# Patient Record
Sex: Female | Born: 1985
Health system: Southern US, Community
[De-identification: ages and names within clinical notes are randomized; demographics above are authoritative.]

## PROBLEM LIST (undated history)

## (undated) DIAGNOSIS — D649 Anemia, unspecified: Secondary | ICD-10-CM

## (undated) HISTORY — PX: OTHER SURGICAL HISTORY: SHX169

---

## 2014-08-04 ENCOUNTER — Encounter: Payer: Self-pay | Admitting: Family Medicine

## 2014-08-04 ENCOUNTER — Ambulatory Visit (INDEPENDENT_AMBULATORY_CARE_PROVIDER_SITE_OTHER): Payer: Medicaid Other | Admitting: Family Medicine

## 2014-08-04 ENCOUNTER — Other Ambulatory Visit (HOSPITAL_COMMUNITY)
Admission: RE | Admit: 2014-08-04 | Discharge: 2014-08-04 | Disposition: A | Discharge status: Home / Self Care | Payer: Medicaid Other | Source: Ambulatory Visit | Attending: Family Medicine | Admitting: Family Medicine

## 2014-08-04 VITALS — BP 112/72 | HR 108 | Temp 98.4°F | Ht 64.5 in | Wt 156.6 lb

## 2014-08-04 DIAGNOSIS — Z789 Other specified health status: Secondary | ICD-10-CM

## 2014-08-04 DIAGNOSIS — Z113 Encounter for screening for infections with a predominantly sexual mode of transmission: Secondary | ICD-10-CM | POA: Insufficient documentation

## 2014-08-04 DIAGNOSIS — Z7189 Other specified counseling: Secondary | ICD-10-CM

## 2014-08-04 DIAGNOSIS — R81 Glycosuria: Secondary | ICD-10-CM

## 2014-08-04 DIAGNOSIS — Z3482 Encounter for supervision of other normal pregnancy, second trimester: Secondary | ICD-10-CM

## 2014-08-04 DIAGNOSIS — Z603 Acculturation difficulty: Secondary | ICD-10-CM | POA: Insufficient documentation

## 2014-08-04 DIAGNOSIS — N912 Amenorrhea, unspecified: Secondary | ICD-10-CM

## 2014-08-04 DIAGNOSIS — Z7689 Persons encountering health services in other specified circumstances: Secondary | ICD-10-CM

## 2014-08-04 DIAGNOSIS — Z3480 Encounter for supervision of other normal pregnancy, unspecified trimester: Secondary | ICD-10-CM | POA: Insufficient documentation

## 2014-08-04 LAB — POCT URINALYSIS DIPSTICK
Bilirubin, UA: NEGATIVE
Blood, UA: NEGATIVE
Glucose, UA: 100
Leukocytes, UA: NEGATIVE
Nitrite, UA: NEGATIVE
Protein, UA: NEGATIVE
Spec Grav, UA: 1.02
Urobilinogen, UA: 0.2
pH, UA: 7

## 2014-08-04 LAB — POCT URINE PREGNANCY: Preg Test, Ur: POSITIVE

## 2014-08-04 LAB — GLUCOSE, CAPILLARY: Glucose-Capillary: 107 mg/dL — ABNORMAL HIGH (ref 70–99)

## 2014-08-04 NOTE — Assessment & Plan Note (Addendum)
Would be a scheduled C-section (2 prior c- sections) Adopt a mom application needed (Dr. Claiborne BillingsKuneff to be OB) >>[facility] Positive ketones, glucosuria, POCT 107 in office (otherwise negative UA) Exam consistent with ~ [redacted] week gestation.  FHR 150's, good fetal movement, no leakage of fluids or vaginal bleeding.  F/U 1 week for labs.

## 2014-08-04 NOTE — Progress Notes (Signed)
Clemens CatholicSarra Gabralla  interpreter utilized during today's visit.  [facility] New Patient Visit  HPI:  [facility] for a new patient appointment to establish general primary care, also to discuss her pregnancy. Last menstrual period: March 31, 2014. Positive pregnancy test August 10. Initial nausea and vomit but as passed. [place] home country. Immig[place] to [place] - "sugar in urine" a few weeks ago.  - Went through [place]  for 1 month for immigration.   ROS: See HPI  Immigrant Social History: - Name spelling correctly?:Yes - Date arrived in [place]: Oct.16, 2015 - Language: Arabic, Sudense  -Requires intepreter (essentially speaks no [place]) - Education: Highest level of education: 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, Environmental managerphysics) - Prior work:None - Other important info: Both children born via c-section (failure to progress)  - Best contact name and number: [phone] - Tobacco/alcohol/drug use: No tobacco/alcohol/drugs - Marriage Status: Married (7 years), 2 children - Sexual activity: Yes - Class A/B conditions: pregnancy - Documented IOM Health Problems: None - Were you beaten or tortured in your country or refugee camp? No, immigrated to [place]    Preventative Care History: -Seen at health department?: No   Past Medical Hx:  - [id number]- G4P2012; one miscarriage   Past Surgical Hx:  - C-sections x2 2009 for failure to progress and 2011 scheduled.  Family Hx: updated in Epic - Number of family members: One sister, four brothers, parents living  - Number of family members in [place]: Husband, Uncle  PHYSICAL EXAM: BP 112/72 mmHg  Pulse 108  Temp(Src) 98.4 F (36.9 C) (Oral)  Ht 5' 4.5" (1.638 m)  Wt 156 lb 9.6 oz (71.033 kg)  BMI 26.47 kg/m2  LMP 03/11/2014 (Exact Date) MVH:QIONGEXBGen:pleasant HEENT: AT. [place]. Bilateral TM visualized and normal in appearance. Bilateral eyes without injections or icterus. MMM. Bilateral nares without erythema or swelling. Throat without erythema or exudates.   CV: Mildly tachycardic, no murmur.  Chest: CTAB, no wheeze or crackles Abd: Soft. Gravid (Uterine fundus 1 finger breath below umbilicus) . NTND. BS present  Ext: No erythema. Trace edema.  Skin:No rashes, purpura or petechiae.  Neuro: Normal gait. PERLA. EOMi. Alert. CN 2- 12 intact   Psych: normal affect, dress, mood. Normal speech.     # See also problem based charting.   Examined and interviewed with Dr. Gwendolyn GrantWalden  FOLLOW UP: F/u in 1 week for labs and follow up to ketones/glucose in urine with normal POCT glucose of 107

## 2014-08-04 NOTE — Assessment & Plan Note (Signed)
Immigration from [place].

## 2014-08-04 NOTE — Patient Instructions (Addendum)
We need you to get your insurance started. We will be contacting you for information on getting this started.  I will need to see back in one week and have labs drawn (Tuesday 10th @ 11:30)

## 2014-08-06 DIAGNOSIS — R81 Glycosuria: Secondary | ICD-10-CM | POA: Insufficient documentation

## 2014-08-06 LAB — URINE CYTOLOGY ANCILLARY ONLY
Chlamydia: NEGATIVE
Neisseria Gonorrhea: NEGATIVE

## 2014-08-06 NOTE — Assessment & Plan Note (Signed)
Needs BMET next visit to screen for kidney disease.

## 2014-08-12 ENCOUNTER — Encounter: Payer: Self-pay | Admitting: Family Medicine

## 2014-08-12 ENCOUNTER — Ambulatory Visit (INDEPENDENT_AMBULATORY_CARE_PROVIDER_SITE_OTHER): Payer: Medicaid Other | Admitting: Family Medicine

## 2014-08-12 VITALS — BP 100/69 | HR 97 | Temp 98.4°F | Ht 65.0 in | Wt 157.0 lb

## 2014-08-12 DIAGNOSIS — R81 Glycosuria: Secondary | ICD-10-CM

## 2014-08-12 DIAGNOSIS — O000 Abdominal pregnancy: Secondary | ICD-10-CM

## 2014-08-12 DIAGNOSIS — O0001 Abdominal pregnancy with intrauterine pregnancy: Secondary | ICD-10-CM

## 2014-08-12 DIAGNOSIS — Z3481 Encounter for supervision of other normal pregnancy, first trimester: Secondary | ICD-10-CM

## 2014-08-12 NOTE — Patient Instructions (Signed)
We will check labs today.  Come back in about 4 weeks for your pregnancy follow-up.   We should be able to get pregnancy Medicaid for you.  Call if you are unable to obtain this.    It was good to see you today!

## 2014-08-12 NOTE — Progress Notes (Addendum)
Subjective:    Arabic Interpreter:  [name] from [employer]  [name] is a 28 y.o. [phone] [facility]U for glycosuria:  1.  Glycosuria:  Found on U/A last visit to clinic.  UA at that time otherwise negative.  Told she had "sugar urine" while in [place] as well.  Returned today for [employer] check.    Also pregnant.  No headaches.  No vision changes.  No LE edema.  No vaginal bleeding or discharge.  Currently establishing pregnancy Medicaid.   ROS as above per HPI, otherwise neg.   The following portions of the patient's history were reviewed and updated as appropriate: allergies, current medications, past medical history, family and social history, and problem list. Patient is a nonsmoker.    PMH reviewed.  No past medical history on file. No past surgical history on file.  Medications reviewed. No current outpatient prescriptions on file.   No current facility-administered medications for this visit.     Objective:   Physical Exam BP 100/69 mmHg  Pulse 97  Temp(Src) 98.4 F (36.9 C) (Oral)  Ht 5\' 5"  (1.651 m)  Wt 157 lb (71.215 kg)  BMI 26.13 kg/m2  LMP 03/31/2014 Gen:  Alert, cooperative patient who appears stated age in no acute distress.  Vital signs reviewed. HEENT: EOMI,  MMM Cardiac:  Regular rate and rhythm without murmur auscultated.  Good S1/S2. Pulm:  Clear to auscultation bilaterally with good air movement.  No wheezes or rales noted.   Back:  No CVA tenderness.   No results found for this or any previous visit (from the past 72 hour(s)).

## 2014-08-13 ENCOUNTER — Encounter: Payer: Self-pay | Admitting: Family Medicine

## 2014-08-13 LAB — OBSTETRIC PANEL
Antibody Screen: NEGATIVE
Basophils Absolute: 0 10*3/uL (ref 0.0–0.1)
Basophils Relative: 0 % (ref 0–1)
Eosinophils Absolute: 0.1 10*3/uL (ref 0.0–0.7)
Eosinophils Relative: 1 % (ref 0–5)
HCT: 32.8 % — ABNORMAL LOW (ref 36.0–46.0)
Hemoglobin: 10.2 g/dL — ABNORMAL LOW (ref 12.0–15.0)
Hepatitis B Surface Ag: NEGATIVE
Lymphocytes Relative: 26 % (ref 12–46)
Lymphs Abs: 2.1 10*3/uL (ref 0.7–4.0)
MCH: 20.1 pg — ABNORMAL LOW (ref 26.0–34.0)
MCHC: 31.1 g/dL (ref 30.0–36.0)
MCV: 64.7 fL — ABNORMAL LOW (ref 78.0–100.0)
Monocytes Absolute: 0.6 10*3/uL (ref 0.1–1.0)
Monocytes Relative: 7 % (ref 3–12)
Neutro Abs: 5.3 10*3/uL (ref 1.7–7.7)
Neutrophils Relative %: 66 % (ref 43–77)
Platelets: 416 10*3/uL — ABNORMAL HIGH (ref 150–400)
RBC: 5.07 MIL/uL (ref 3.87–5.11)
RDW: 22.1 % — ABNORMAL HIGH (ref 11.5–15.5)
Rh Type: POSITIVE
Rubella: <0.1 Index (ref <0.90)
WBC: 8 10*3/uL (ref 4.0–10.5)

## 2014-08-13 LAB — SICKLE CELL SCREEN: Sickle Cell Screen: NEGATIVE

## 2014-08-13 LAB — HIV ANTIBODY (ROUTINE TESTING W REFLEX): HIV 1&2 Ab, 4th Generation: NONREACTIVE

## 2014-08-13 NOTE — Assessment & Plan Note (Addendum)
[id number].   Healthy children aged 89 and 89, both born [place]. Doing well from pregnancy standpoint.  Prenatal vitamins and checking prenatal labs today.

## 2014-08-13 NOTE — Assessment & Plan Note (Signed)
BMET today to evaluate for kidney dysfunction.  Also possibly secondary to pregnancy.   Will FU at 1st OB visit pending her lab results.

## 2014-08-14 LAB — URINE CULTURE
Colony Count: NO GROWTH
Organism ID, Bacteria: NO GROWTH

## 2014-09-03 ENCOUNTER — Ambulatory Visit (INDEPENDENT_AMBULATORY_CARE_PROVIDER_SITE_OTHER): Payer: Medicaid Other | Admitting: Family Medicine

## 2014-09-03 ENCOUNTER — Ambulatory Visit (INDEPENDENT_AMBULATORY_CARE_PROVIDER_SITE_OTHER): Payer: Medicaid Other

## 2014-09-03 ENCOUNTER — Encounter: Payer: Self-pay | Admitting: Family Medicine

## 2014-09-03 VITALS — BP 115/78 | HR 102 | Wt 162.7 lb

## 2014-09-03 DIAGNOSIS — O99012 Anemia complicating pregnancy, second trimester: Secondary | ICD-10-CM

## 2014-09-03 DIAGNOSIS — Z862 Personal history of diseases of the blood and blood-forming organs and certain disorders involving the immune mechanism: Secondary | ICD-10-CM | POA: Insufficient documentation

## 2014-09-03 DIAGNOSIS — R81 Glycosuria: Secondary | ICD-10-CM

## 2014-09-03 DIAGNOSIS — Z3482 Encounter for supervision of other normal pregnancy, second trimester: Secondary | ICD-10-CM

## 2014-09-03 DIAGNOSIS — O99019 Anemia complicating pregnancy, unspecified trimester: Secondary | ICD-10-CM | POA: Insufficient documentation

## 2014-09-03 DIAGNOSIS — Z23 Encounter for immunization: Secondary | ICD-10-CM

## 2014-09-03 LAB — BASIC METABOLIC PANEL
BUN: 6 mg/dL (ref 6–23)
CO2: 21 mEq/L (ref 19–32)
Calcium: 9.1 mg/dL (ref 8.4–10.5)
Chloride: 105 mEq/L (ref 96–112)
Creat: 0.44 mg/dL — ABNORMAL LOW (ref 0.50–1.10)
Glucose, Bld: 77 mg/dL (ref 70–99)
Potassium: 4.1 mEq/L (ref 3.5–5.3)
Sodium: 135 mEq/L (ref 135–145)

## 2014-09-03 LAB — TSH: TSH: 2.235 u[IU]/mL (ref 0.350–4.500)

## 2014-09-03 MED ORDER — PRENATAL/IRON PO TABS: 1.0000 | ORAL_TABLET | Freq: Every day | ORAL | Status: DC

## 2014-09-03 NOTE — Progress Notes (Signed)
[name] is a 28 y.o. [phone] @ 4175w3d presents for initial prenatal care. Patient did have ultrasound, and [place], that showed her due date to be 12/28/2014. This ultrasound completed [redacted] weeks gestation. She is present today with her husband, [name]. Patient does admit to feeling some fatigue, otherwise she is doing well with her pregnancy. It sounds as if there was a possibility she had been diagnosed with gestational diabetes at the end of her second pregnancy. She was a induction of labor at 39 weeks for her first pregnancy, which proceeded to C-section for failure to progress. Her second pregnancy was a scheduled C-section. She been denies any pressure pain, leaking or gush of fluid or vaginal bleeding today. No contractions. She does notice some mild changes in her nipples, a small amount of cheesy-like discharge. She denies erythema or pain.  O: BP 115/78 mmHg  Pulse 102  Wt 162 lb 11.2 oz (73.8 kg)  LMP 03/31/2014 (LMP Unknown) Abd: Gravid Breast: Very mild cheesy white discharge can be expressed from nipple. No erythema, no tenderness.   A/P: - make an appt for early glucola asap - tdap at 28 weeks. - A pos - BF, circ if boy; [facility] - Rubella titer Low, will need vaccination after delivery.  - PNV with iron prescribed - US ordered - PTL, vaginal bleeding precautions discussed, pt is aware to go to [facility] and its location - We'll need to establish with women's clinic, for repeat C-section scheduled. - TSH and BMP today - Four-week appointment in OB clinic, will need female provider

## 2014-09-03 NOTE — Assessment & Plan Note (Signed)
BMT ordered today to evaluate kidney dysfunction. Last check patient had 100 glucose in her urine, this could be from pregnancy. She was diagnosed with gestational diabetes at the end of her last pregnancy.

## 2014-09-03 NOTE — Assessment & Plan Note (Signed)
Hemoglobin 10.2. Will prescribe any other vitamins with iron.

## 2014-09-03 NOTE — Patient Instructions (Signed)
Please schedule your one-hour Glucola test for as soon as possible. This test will take about 1 hour. He will have you drink a sugary substance, and the take your blood work one hour later. Your next OB appointment, should be appendectomy OB clinic in 3-4 weeks. He will then start to follow-up with Dr. Claiborne BillingsKuneff approximately every 2 weeks. You received her flu vaccination today.  Second Trimester of Pregnancy The second trimester is from week 13 through week 28, month 4 through 6. This is often the time in pregnancy that you feel your best. Often times, morning sickness has lessened or quit. You may have more energy, and you may get hungry more often. Your unborn baby (fetus) is growing rapidly. At the end of the sixth month, he or she is about 9 inches long and weighs about 1 pounds. You will likely feel the baby move (quickening) between 18 and 20 weeks of pregnancy. HOME CARE   Avoid all smoking, herbs, and alcohol. Avoid drugs not approved by your doctor.  Only take medicine as told by your doctor. Some medicines are safe and some are not during pregnancy.  Exercise only as told by your doctor. Stop exercising if you start having cramps.  Eat regular, healthy meals.  Wear a good support bra if your breasts are tender.  Do not use hot tubs, steam rooms, or saunas.  Wear your seat belt when driving.  Avoid raw meat, uncooked cheese, and liter boxes and soil used by cats.  Take your prenatal vitamins.  Try taking medicine that helps you poop (stool softener) as needed, and if your doctor approves. Eat more fiber by eating fresh fruit, vegetables, and whole grains. Drink enough fluids to keep your pee (urine) clear or pale yellow.  Take warm water baths (sitz baths) to soothe pain or discomfort caused by hemorrhoids. Use hemorrhoid cream if your doctor approves.  If you have puffy, bulging veins (varicose veins), wear support hose. Raise (elevate) your feet for 15 minutes, 3-4 times a  day. Limit salt in your diet.  Avoid heavy lifting, wear low heals, and sit up straight.  Rest with your legs raised if you have leg cramps or low back pain.  Visit your dentist if you have not gone during your pregnancy. Use a soft toothbrush to brush your teeth. Be gentle when you floss.  You can have sex (intercourse) unless your doctor tells you not to.  Go to your doctor visits. GET HELP IF:   You feel dizzy.  You have mild cramps or pressure in your lower belly (abdomen).  You have a nagging pain in your belly area.  You continue to feel sick to your stomach (nauseous), throw up (vomit), or have watery poop (diarrhea).  You have bad smelling fluid coming from your vagina.  You have pain with peeing (urination). GET HELP RIGHT AWAY IF:   You have a fever.  You are leaking fluid from your vagina.  You have spotting or bleeding from your vagina.  You have severe belly cramping or pain.  You lose or gain weight rapidly.  You have trouble catching your breath and have chest pain.  You notice sudden or extreme puffiness (swelling) of your face, hands, ankles, feet, or legs.  You have not felt the baby move in over an hour.  You have severe headaches that do not go away with medicine.  You have vision changes. Document Released: 12/14/2009 Document Revised: 01/14/2013 Document Reviewed: 11/20/2012 ExitCare Patient Information 2015  [employer]. This information is not intended to replace advice given to you by your health care provider. Make sure you discuss any questions you have with your health care provider.

## 2014-09-03 NOTE — Progress Notes (Signed)
Patient was accompanied by interpreter from [employer] because of (Arabic) language barrier.Glennie HawkSimpson, Cheri Ayotte R

## 2014-09-04 ENCOUNTER — Encounter: Payer: Self-pay | Admitting: Family Medicine

## 2014-09-08 ENCOUNTER — Other Ambulatory Visit: Payer: Medicaid Other

## 2014-09-09 ENCOUNTER — Ambulatory Visit (HOSPITAL_COMMUNITY): Payer: Medicaid Other

## 2014-09-09 ENCOUNTER — Other Ambulatory Visit (INDEPENDENT_AMBULATORY_CARE_PROVIDER_SITE_OTHER): Payer: Medicaid Other

## 2014-09-09 DIAGNOSIS — Z3482 Encounter for supervision of other normal pregnancy, second trimester: Secondary | ICD-10-CM

## 2014-09-09 LAB — GLUCOSE, CAPILLARY
Comment 1: 1
Glucose-Capillary: 146 mg/dL — ABNORMAL HIGH (ref 70–99)

## 2014-09-09 NOTE — Progress Notes (Signed)
1 hr ob = 146. 3 hr GTT scheduled for 09/10/14.Grey Rakestraw, Rodena Medinobert Lee

## 2014-09-10 ENCOUNTER — Other Ambulatory Visit (INDEPENDENT_AMBULATORY_CARE_PROVIDER_SITE_OTHER): Payer: Medicaid Other

## 2014-09-10 DIAGNOSIS — Z3482 Encounter for supervision of other normal pregnancy, second trimester: Secondary | ICD-10-CM

## 2014-09-10 LAB — GLUCOSE, CAPILLARY: Glucose-Capillary: 88 mg/dL (ref 70–99)

## 2014-09-11 LAB — GLUCOSE TOLERANCE, 3 HOURS
Glucose Tolerance, 1 hour: 144 mg/dL (ref 70–189)
Glucose Tolerance, 2 hour: 125 mg/dL (ref 70–164)
Glucose Tolerance, Fasting: 80 mg/dL (ref 70–104)
Glucose, GTT - 3 Hour: 133 mg/dL (ref 70–144)

## 2014-09-15 ENCOUNTER — Ambulatory Visit (HOSPITAL_COMMUNITY)
Admission: RE | Admit: 2014-09-15 | Discharge: 2014-09-15 | Disposition: A | Discharge status: Home / Self Care | Payer: Medicaid Other | Source: Ambulatory Visit | Attending: Family Medicine | Admitting: Family Medicine

## 2014-09-15 DIAGNOSIS — Z1389 Encounter for screening for other disorder: Secondary | ICD-10-CM | POA: Insufficient documentation

## 2014-09-15 DIAGNOSIS — O0932 Supervision of pregnancy with insufficient antenatal care, second trimester: Secondary | ICD-10-CM | POA: Diagnosis not present

## 2014-09-15 DIAGNOSIS — Z3A25 25 weeks gestation of pregnancy: Secondary | ICD-10-CM | POA: Diagnosis not present

## 2014-09-15 DIAGNOSIS — Z36 Encounter for antenatal screening of mother: Secondary | ICD-10-CM | POA: Insufficient documentation

## 2014-09-15 DIAGNOSIS — O09292 Supervision of pregnancy with other poor reproductive or obstetric history, second trimester: Secondary | ICD-10-CM | POA: Insufficient documentation

## 2014-09-15 DIAGNOSIS — Z3482 Encounter for supervision of other normal pregnancy, second trimester: Secondary | ICD-10-CM

## 2014-09-15 DIAGNOSIS — O3421 Maternal care for scar from previous cesarean delivery: Secondary | ICD-10-CM | POA: Insufficient documentation

## 2014-10-14 ENCOUNTER — Ambulatory Visit (INDEPENDENT_AMBULATORY_CARE_PROVIDER_SITE_OTHER): Payer: Medicaid Other | Admitting: Family Medicine

## 2014-10-14 VITALS — BP 113/81 | HR 102 | Wt 167.5 lb

## 2014-10-14 DIAGNOSIS — Z331 Pregnant state, incidental: Secondary | ICD-10-CM

## 2014-10-14 DIAGNOSIS — Z3493 Encounter for supervision of normal pregnancy, unspecified, third trimester: Secondary | ICD-10-CM

## 2014-10-14 DIAGNOSIS — Z23 Encounter for immunization: Secondary | ICD-10-CM

## 2014-10-14 NOTE — Patient Instructions (Signed)
Third Trimester of Pregnancy The third trimester is from week 29 through week 42, months 7 through 9. This trimester is when your unborn baby (fetus) is growing very fast. At the end of the ninth month, the unborn baby is about 20 inches in length. It weighs about 6-10 pounds.  HOME CARE   Avoid all smoking, herbs, and alcohol. Avoid drugs not approved by your doctor.  Only take medicine as told by your doctor. Some medicines are safe and some are not during pregnancy.  Exercise only as told by your doctor. Stop exercising if you start having cramps.  Eat regular, healthy meals.  Wear a good support bra if your breasts are tender.  Do not use hot tubs, steam rooms, or saunas.  Wear your seat belt when driving.  Avoid raw meat, uncooked cheese, and liter boxes and soil used by cats.  Take your prenatal vitamins.  Try taking medicine that helps you poop (stool softener) as needed, and if your doctor approves. Eat more fiber by eating fresh fruit, vegetables, and whole grains. Drink enough fluids to keep your pee (urine) clear or pale yellow.  Take warm water baths (sitz baths) to soothe pain or discomfort caused by hemorrhoids. Use hemorrhoid cream if your doctor approves.  If you have puffy, bulging veins (varicose veins), wear support hose. Raise (elevate) your feet for 15 minutes, 3-4 times a day. Limit salt in your diet.  Avoid heavy lifting, wear low heels, and sit up straight.  Rest with your legs raised if you have leg cramps or low back pain.  Visit your dentist if you have not gone during your pregnancy. Use a soft toothbrush to brush your teeth. Be gentle when you floss.  You can have sex (intercourse) unless your doctor tells you not to.  Do not travel far distances unless you must. Only do so with your doctor's approval.  Take prenatal classes.  Practice driving to the hospital.  Pack your hospital bag.  Prepare the baby's room.  Go to your doctor visits. GET  HELP IF:  You are not sure if you are in labor or if your water has broken.  You are dizzy.  You have mild cramps or pressure in your lower belly (abdominal).  You have a nagging pain in your belly area.  You continue to feel sick to your stomach (nauseous), throw up (vomit), or have watery poop (diarrhea).  You have bad smelling fluid coming from your vagina.  You have pain with peeing (urination). GET HELP RIGHT AWAY IF:   You have a fever.  You are leaking fluid from your vagina.  You are spotting or bleeding from your vagina.  You have severe belly cramping or pain.  You lose or gain weight rapidly.  You have trouble catching your breath and have chest pain.  You notice sudden or extreme puffiness (swelling) of your face, hands, ankles, feet, or legs.  You have not felt the baby move in over an hour.  You have severe headaches that do not go away with medicine.  You have vision changes. Document Released: 12/14/2009 Document Revised: 01/14/2013 Document Reviewed: 11/20/2012 ExitCare Patient Information 2015 [employer]. This information is not intended to replace advice given to you by your health care provider. Make sure you discuss any questions you have with your health care provider.  

## 2014-10-14 NOTE — Progress Notes (Signed)
Lavonne Chickajat M Mariner is a [age] y.o. [phone] 723w2d presents for routine prenatal care.  She is present today with her husband, Hlafalla and interpreter. Patient states she is feeling great today. Her one-hour Glucola results were high, and her 3 hour were normal high results. Patient denies any leaking of fluid, vaginal bleeding or irritation. O: BP 113/81 mmHg  Pulse 102  Wt 167 lb 8 oz (75.978 kg)  LMP 03/31/2014 (LMP Unknown) Abd: Gravid Ext: No edema   A/P: - Discussed diet, avoiding sugars and carbs when possible. Discussed Glucola results. - tdap completed today - A pos - BF, circ is likely, but they would like to discuss it together first. - Rubella titer Low, will need vaccination after delivery.  - PTL, vaginal bleeding precautions discussed, pt is aware to go to [facility] and its location - Appointment with Dr. Shawnie PonsPratt, to review scheduled C-section. Patient was C-section with her first pregnancy due to failure to progress (no records, this is what it sounds like from her description). Second child was born via C-section and was scheduled. It does sound as if she was a gestational diabetic in her second pregnancy, although she states she was not on any medications. She is uncertain of the weight of her children at birth. - Third trimester labs are completed today. - [facility]

## 2014-10-15 LAB — CBC WITH DIFFERENTIAL/PLATELET
Basophils Absolute: 0 10*3/uL (ref 0.0–0.1)
Basophils Relative: 0 % (ref 0–1)
Eosinophils Absolute: 0.1 10*3/uL (ref 0.0–0.7)
Eosinophils Relative: 2 % (ref 0–5)
HCT: 33.1 % — ABNORMAL LOW (ref 36.0–46.0)
Hemoglobin: 10.6 g/dL — ABNORMAL LOW (ref 12.0–15.0)
Lymphocytes Relative: 26 % (ref 12–46)
Lymphs Abs: 1.8 10*3/uL (ref 0.7–4.0)
MCH: 22.1 pg — ABNORMAL LOW (ref 26.0–34.0)
MCHC: 32 g/dL (ref 30.0–36.0)
MCV: 69.1 fL — ABNORMAL LOW (ref 78.0–100.0)
Monocytes Absolute: 0.5 10*3/uL (ref 0.1–1.0)
Monocytes Relative: 8 % (ref 3–12)
Neutro Abs: 4.4 10*3/uL (ref 1.7–7.7)
Neutrophils Relative %: 64 % (ref 43–77)
Platelets: 293 10*3/uL (ref 150–400)
RBC: 4.79 MIL/uL (ref 3.87–5.11)
RDW: 23.6 % — ABNORMAL HIGH (ref 11.5–15.5)
WBC: 6.8 10*3/uL (ref 4.0–10.5)

## 2014-10-15 LAB — RPR

## 2014-10-15 LAB — HIV ANTIBODY (ROUTINE TESTING W REFLEX): HIV 1&2 Ab, 4th Generation: NONREACTIVE

## 2014-10-27 ENCOUNTER — Encounter: Payer: Medicaid Other | Admitting: Family Medicine

## 2014-10-28 ENCOUNTER — Ambulatory Visit (INDEPENDENT_AMBULATORY_CARE_PROVIDER_SITE_OTHER): Payer: Medicaid Other | Admitting: Family Medicine

## 2014-10-28 VITALS — BP 112/80 | HR 108 | Temp 98.6°F | Wt 167.0 lb

## 2014-10-28 DIAGNOSIS — Z3483 Encounter for supervision of other normal pregnancy, third trimester: Secondary | ICD-10-CM

## 2014-10-28 NOTE — Assessment & Plan Note (Signed)
See flow sheet

## 2014-10-28 NOTE — Patient Instructions (Addendum)
Good to meet you.  Come for your appointment with Dr Shawnie PonsPratt 2/10. We are scheduling a follow up ultrasound. Follow up in 2 weeks with our OB clinic. Watch your intake of sugar to keep this under control. If you have chest pain, trouble breathing, or other concerns, seek immediate care. If you have a gush of fluid, vaginal bleeding, contractions, or baby stops moving, [facility].  Best,  Vanessa SingletonMaria T Myrl Lazarus, MD  Fetal Movement Counts Patient Name: __________________________________________________ Patient Due Date: ____________________ Performing a fetal movement count is highly recommended in high-risk pregnancies, but it is good for every pregnant woman to do. Your health care provider may ask you to start counting fetal movements at 28 weeks of the pregnancy. Fetal movements often increase:  After eating a full meal.  After physical activity.  After eating or drinking something sweet or cold.  At rest. Pay attention to when you feel the baby is most active. This will help you notice a pattern of your baby's sleep and wake cycles and what factors contribute to an increase in fetal movement. It is important to perform a fetal movement count at the same time each day when your baby is normally most active.  HOW TO COUNT FETAL MOVEMENTS 1. Find a quiet and comfortable area to sit or lie down on your left side. Lying on your left side provides the best blood and oxygen circulation to your baby. 2. Write down the day and time on a sheet of paper or in a journal. 3. Start counting kicks, flutters, swishes, rolls, or jabs in a 2-hour period. You should feel at least 10 movements within 2 hours. 4. If you do not feel 10 movements in 2 hours, wait 2-3 hours and count again. Look for a change in the pattern or not enough counts in 2 hours. SEEK MEDICAL CARE IF:  You feel less than 10 counts in 2 hours, tried twice.  There is no movement in over an hour.  The  pattern is changing or taking longer each day to reach 10 counts in 2 hours.  You feel the baby is not moving as he or she usually does. Date: ____________ Movements: ____________ Start time: ____________ Doreatha MartinFinish time: ____________  Date: ____________ Movements: ____________ Start time: ____________ Doreatha MartinFinish time: ____________

## 2014-10-28 NOTE — Progress Notes (Addendum)
28 y.o. [id number] at 7065w2d here for routine prenatal visit.   S: Feeling well, denies contractions, vaginal bleeding, change in fetal movement, or gush of clear fluid.  O: BP 112/80 mmHg  Pulse 108  Temp(Src) 98.6 F (37 C)  Wt 167 lb (75.751 kg)  LMP 03/31/2014 (LMP Unknown)  GEN: NAD CV: RRR with mild tachycardia, no murmurs, rubs, or gallops PULM: CTAB, normal effort EXTR: 1+ pitting edema bilaterally, no asymmetry or calf tenderness HEENT: AT/[place], sclera clear, EOMI, o/p clear with no exudate  A/P:  [facility]. Rubella titer low - needs immunization after delivery Appt 2/10 with Dr Shawnie PonsPratt for evaluation given h/o c-section. Contraception: Previously used OCP; information given on other forms of contraception. US: follow up anatomy survey ordered for 2 months from 12/14. Borderline high glucose - Discussed watching carbohydrates/sugars. PTL warning signs - reviewed; Mild tachycardia - No chest pain, dyspnea, leg swelling; currently sore throat with no fever or difficulty swallowing. Has been mildly tachycardic (100s) last 3 visits. Monitor and return precautions reviewed. LE edema - Symmetric and trace-1+; compression stockings, keep legs raised F/u in 2 weeks for OB clinic.

## 2014-11-12 ENCOUNTER — Ambulatory Visit (INDEPENDENT_AMBULATORY_CARE_PROVIDER_SITE_OTHER): Payer: Medicaid Other | Admitting: Family Medicine

## 2014-11-12 ENCOUNTER — Encounter: Payer: Self-pay | Admitting: Family Medicine

## 2014-11-12 VITALS — BP 110/82 | HR 94 | Temp 98.5°F | Wt 173.3 lb

## 2014-11-12 DIAGNOSIS — O34219 Maternal care for unspecified type scar from previous cesarean delivery: Secondary | ICD-10-CM | POA: Insufficient documentation

## 2014-11-12 DIAGNOSIS — O3421 Maternal care for scar from previous cesarean delivery: Secondary | ICD-10-CM

## 2014-11-12 DIAGNOSIS — Z3483 Encounter for supervision of other normal pregnancy, third trimester: Secondary | ICD-10-CM

## 2014-11-12 NOTE — Patient Instructions (Signed)
          ()   .      :    ǡ     ȡ   ʡ    .          .    .     .   .   .     .   ǡ    .  ߡ          .   : . .  .   .   .  .         .                   .       .           .      .  ( )          .      .        .         ( )  .                     .        ( )     .        .  2    :  () .           .  .            .           ( )    .          .  ߡ    .        .     .        .       ɡ     .    ǡ         .      .             .                    .          .         .              .            .    ()   ޡ        .          .     .        .     .      .       ֡   .  .             .     .         .        .           .       .            ɡ        .            .            ( ).               .     .   :   2005/09/19  : 2013/07/10   : 2013/04/10 ExitCare    2015 [employer].              .            .                 Marland Kitchen                  Marland Kitchen                  Marland Kitchen                .                        .       ()       .           .            .                .      .               2    .            .    .            Marland Kitchen               .             ()  .         2               .             . .      .            ().         .    .     .     .  Marland Kitchen                    .             : .    . .                   .               (  ).             .       .   .            .    .            .    4     .        .        .                           ()    .              .            .           ().          ().      2-3       .           Marland Kitchen                 .                  .      Marland Kitchen             .                    .         :          .  3-4 .          .        :          .  .                      .       .        :         . .   .        .         .  :         1-3  .          .                 .       .              3    24 .          5 .    3    24    5  .      .    3    24    7  .       .      10?       3   .  ??   4-7  (113-198 )     4 .       5         2 .        .   .                   Marland Kitchen.                  .    "   ."          (  4-6   ).        .                    (SIDS).           .        Cyndia Skeeters.  unlatches           .                         .         .                :    ( 4     )    1-3 .          3     5      .         8    24            6    .                            .                             .                  .              .           .            .          Marland Kitchen.                 Marland Kitchen.                .                .           :     .    .                            .Marland Kitchen  underwire     .     3-4    .         Marland Kitchen        .        .         .              .     Marland Kitchen                     .             ().          .     3-5   .       :        .            (         )    .         .                    .     (  )     1-2        .             Marland Kitchen            .     48            . OVERALL         .        3   .                 .             .          ( 10  ).                 .     .    .    Marland Kitchen              .                          .        .                 .    :               .     .    .     .       .    .    .        .           .   .       .     .     3    24 .    3    24 .            .      5   . SEEK    :      ( )     .     .   : 2005/09/19  : 2013/09/24   : 2013/03/13 ExitCare    2015 [employer].              Marland Kitchen            Marland Kitchen

## 2014-11-12 NOTE — Progress Notes (Signed)
[facility] Attending Consult Note  29 y.o. [phone] at 6034w3d with Estimated Date of Delivery: 12/28/14 was seen today in office to discuss trial of labor after cesarean section (TOLAC) versus elective repeat cesarean delivery (ERCD). The following risks were discussed with the patient.  For ROB today--discussed PTL signs and what should bring pt to the hospital.  Risk of uterine rupture at term is 2-5 percent with TOLAC after 2 prior C-sections and 0.22 percent with ERCD. 1 in 10 uterine ruptures will result in neonatal death or neurological injury. The benefits of a trial of labor after cesarean (TOLAC) resulting in a vaginal birth after cesarean (VBAC) include the following: shorter length of hospital stay and postpartum recovery (in most cases); fewer complications, such as postpartum fever, wound or uterine infection, thromboembolism (blood clots in the leg or lung), need for blood transfusion and fewer neonatal breathing problems. The risks of an attempted VBAC or TOLAC include the following: Risk of failed trial of labor after cesarean (TOLAC) without a vaginal birth after cesarean (VBAC) resulting in repeat cesarean delivery (RCD) in about 20 to 40 percent of women who attempt VBAC.  Risk of rupture of uterus resulting in an emergency cesarean delivery. The risk of uterine rupture may be related in part to the type of uterine incision made during the first cesarean delivery. A previous transverse uterine incision has the lowest risk of rupture (0.2 to 1.5 percent risk). Vertical or T-shaped uterine incisions have a higher risk of uterine rupture (4 to 9 percent risk)The risk of fetal death is very low with both VBAC and elective repeat cesarean delivery (ERCD), but the likelihood of fetal death is higher with VBAC than with ERCD. Maternal death is very rare with either type of delivery. The risks of an elective repeat cesarean delivery (ERCD) were reviewed with the patient including but  not limited to: 11/998 risk of uterine rupture which could have serious consequences, bleeding which may require transfusion; infection which may require antibiotics; injury to bowel, bladder or other surrounding organs (bowel, bladder, ureters); injury to the fetus; need for additional procedures including hysterectomy in the event of a life-threatening hemorrhage; thromboembolic phenomenon; abnormal placentation; incisional problems; death and other postoperative or anesthesia complications.    These risks and benefits are summarized on the consent form, which was reviewed with the patient during the visit.  All her questions answered and she signed a consent indicating a preference for ERCD. A copy of the consent was given to the patient.  [facility]  Aharon Carriere S 11/12/2014 10:25 AM

## 2014-11-17 ENCOUNTER — Ambulatory Visit (HOSPITAL_COMMUNITY): Admission: RE | Admit: 2014-11-17 | Payer: Medicaid Other | Source: Ambulatory Visit

## 2014-11-19 ENCOUNTER — Ambulatory Visit (HOSPITAL_COMMUNITY)
Admission: RE | Admit: 2014-11-19 | Discharge: 2014-11-19 | Disposition: A | Discharge status: Home / Self Care | Payer: Medicaid Other | Source: Ambulatory Visit | Attending: Family Medicine | Admitting: Family Medicine

## 2014-11-19 DIAGNOSIS — Z3483 Encounter for supervision of other normal pregnancy, third trimester: Secondary | ICD-10-CM | POA: Diagnosis present

## 2014-11-20 ENCOUNTER — Ambulatory Visit (INDEPENDENT_AMBULATORY_CARE_PROVIDER_SITE_OTHER): Payer: Medicaid Other | Admitting: Family Medicine

## 2014-11-20 VITALS — BP 115/80 | HR 111 | Temp 98.1°F | Wt 173.0 lb

## 2014-11-20 DIAGNOSIS — Z3483 Encounter for supervision of other normal pregnancy, third trimester: Secondary | ICD-10-CM

## 2014-11-20 NOTE — Progress Notes (Signed)
[facility] Patient seen for routine prenatal visit, husband and Arabic interpreter present for visit. Patient is [id number] @[redacted]w[redacted]d .  She reports daily fetal movement, no vaginal bleeding or discharge, no regular contractions.  She had OB U/S done yesterday, results reviewed in chart with patient and husband.  They are aware they are having a baby boy and [facility].  Plans to breast-feed, as she did with previous two.  Reviewed chart; failed 1hGTT and passed 3hGTT; received vaccines for influenza and Tdap this pregnancy.   Discussed kick counts and PTL precautions. Confirmed plans for repeat C/S on March 21st, as per discussion with Dr Shawnie PonsPratt on 2/10 for delivery planning.   PLAN: for routine prenatal follow up with Dr Claiborne BillingsKuneff in 2 weeks (at 36 weeks).  Paula ComptonJames Inda Mcglothen, MD

## 2014-11-20 NOTE — Assessment & Plan Note (Signed)
[facility] See Flow Sheet.

## 2014-11-20 NOTE — Patient Instructions (Addendum)
It was a pleasure to meet you today.  Your are at 34 weeks and 4 days along in your pregnancy.   I am glad you have scheduled your repeat C-section for March 21st.   PLEASE SCHEDULE A FOLLOW UP PRENATAL VISIT WITH DR KUNEFF IN 2 WEEKS.     Preterm Labor Information Preterm labor is when labor starts before you are [redacted] weeks pregnant. The normal length of pregnancy is 39 to 41 weeks.  CAUSES  The cause of preterm labor is not often known. The most common known cause is infection. RISK FACTORS  Having a history of preterm labor.  Having your water break before it should.  Having a placenta that covers the opening of the cervix.  Having a placenta that breaks away from the uterus.  Having a cervix that is too weak to hold the baby in the uterus.  Having too much fluid in the amniotic sac.  Taking drugs or smoking while pregnant.  Not gaining enough weight while pregnant.  Being younger than 6418 and older than 31 years old.  Having a low income.  Being African American. SYMPTOMS  Period-like cramps, belly (abdominal) pain, or back pain.  Contractions that are regular, as often as six in an hour. They may be mild or painful.  Contractions that start at the top of the belly. They then move to the lower belly and back.  Lower belly pressure that seems to get stronger.  Bleeding from the vagina.  Fluid leaking from the vagina. TREATMENT  Treatment depends on:  Your condition.  The condition of your baby.  How many weeks pregnant you are. Your doctor may have you:  Take medicine to stop contractions.  Stay in bed except to use the restroom (bed rest).  Stay in the hospital. WHAT SHOULD YOU DO IF YOU THINK YOU ARE IN PRETERM LABOR? Call your doctor right away. You need to go to the hospital right away.  HOW CAN YOU PREVENT PRETERM LABOR IN FUTURE PREGNANCIES?  Stop smoking, if you smoke.  Maintain healthy weight gain.  Do not take drugs or be around  chemicals that are not needed.  Tell your doctor if you think you have an infection.  Tell your doctor if you had a preterm labor before. Document Released: 12/16/2008 Document Revised: 07/10/2013 Document Reviewed: 12/16/2008 [facility] Patient Information 2015 [place]. This information is not intended to replace advice given to you by your health care provider. Make sure you discuss any questions you have with your health care provider.

## 2014-12-04 ENCOUNTER — Ambulatory Visit (INDEPENDENT_AMBULATORY_CARE_PROVIDER_SITE_OTHER): Payer: Medicaid Other | Admitting: Family Medicine

## 2014-12-04 ENCOUNTER — Encounter: Payer: Self-pay | Admitting: Family Medicine

## 2014-12-04 VITALS — BP 128/79 | HR 95 | Temp 98.0°F | Wt 173.0 lb

## 2014-12-04 DIAGNOSIS — Z3483 Encounter for supervision of other normal pregnancy, third trimester: Secondary | ICD-10-CM

## 2014-12-04 NOTE — Progress Notes (Signed)
[name] is a 29 y.o. [phone] at 7392w4d for routine follow up.  She reports ocasional UCs 1-2 times daily, no VB or LOF.  Daily fetal movement.  See flow sheet for details.  A/P: Pregnancy at 992w4d.  Doing well.  No complaints or concerns. Pregnancy issues include borderline glucolas (1hr elevated, but 3hr negative)  Infant feeding choice - breastfeed Contraception choice - nexplanon [facility]  Tdap was given already during pregnancy. GBS testing was performed today. Did not repeat GC/CT as patient has no RFs or h/o STIs.  Preterm labor precautions reviewed. Safe sleep discussed. Kick counts reviewed. Follow up 1 week.

## 2014-12-04 NOTE — Progress Notes (Signed)
[facility] Patient visit precepted with Dr. Beryle FlockBacigalupo, I agree with the assessment and plan as documented in the note.  Patient with plan for repeat C/S as per Dr Shawnie PonsPratt visit note.  Follow up in 1 week with primary physician.  Vanessa ComptonJames Shalamar Plourde, MD

## 2014-12-04 NOTE — Patient Instructions (Signed)
Fetal Movement Counts Patient Name: __________________________________________________ Patient Due Date: ____________________ Performing a fetal movement count is highly recommended in high-risk pregnancies, but it is good for every pregnant woman to do. Your health care provider may ask you to start counting fetal movements at 28 weeks of the pregnancy. Fetal movements often increase:  After eating a full meal.  After physical activity.  After eating or drinking something sweet or cold.  At rest. Pay attention to when you feel the baby is most active. This will help you notice a pattern of your baby's sleep and wake cycles and what factors contribute to an increase in fetal movement. It is important to perform a fetal movement count at the same time each day when your baby is normally most active.  HOW TO COUNT FETAL MOVEMENTS  Find a quiet and comfortable area to sit or lie down on your left side. Lying on your left side provides the best blood and oxygen circulation to your baby.  Write down the day and time on a sheet of paper or in a journal.  Start counting kicks, flutters, swishes, rolls, or jabs in a 2-hour period. You should feel at least 10 movements within 2 hours.  If you do not feel 10 movements in 2 hours, wait 2-3 hours and count again. Look for a change in the pattern or not enough counts in 2 hours. SEEK MEDICAL CARE IF:  You feel less than 10 counts in 2 hours, tried twice.  There is no movement in over an hour.  The pattern is changing or taking longer each day to reach 10 counts in 2 hours. You feel the baby is not moving as he or she usually does. Document Released: 10/19/2006 Document Revised: 02/03/2014 Document Reviewed: 07/16/2012 [facility] Patient Information 2015 [place], [place]. This information is not intended to replace advice given to you by your health care provider. Make sure you discuss any questions you have with your health care provider.   Third  Trimester of Pregnancy The third trimester is from week 29 through week 42, months 7 through 9. The third trimester is a time when the fetus is growing rapidly. At the end of the ninth month, the fetus is about 20 inches in length and weighs 6-10 pounds.  BODY CHANGES Your body goes through many changes during pregnancy. The changes vary from woman to woman.   Your weight will continue to increase. You can expect to gain 25-35 pounds (11-16 kg) by the end of the pregnancy.  You may begin to get stretch marks on your hips, abdomen, and breasts.  You may urinate more often because the fetus is moving lower into your pelvis and pressing on your bladder.  You may develop or continue to have heartburn as a result of your pregnancy.  You may develop constipation because certain hormones are causing the muscles that push waste through your intestines to slow down.  You may develop hemorrhoids or swollen, bulging veins (varicose veins).  You may have pelvic pain because of the weight gain and pregnancy hormones relaxing your joints between the bones in your pelvis. Backaches may result from overexertion of the muscles supporting your posture.  You may have changes in your hair. These can include thickening of your hair, rapid growth, and changes in texture. Some women also have hair loss during or after pregnancy, or hair that feels dry or thin. Your hair will most likely return to normal after your baby is born.  Your breasts  will continue to grow and be tender. A yellow discharge may leak from your breasts called colostrum.  Your belly button may stick out.  You may feel short of breath because of your expanding uterus.  You may notice the fetus "dropping," or moving lower in your abdomen.  You may have a bloody mucus discharge. This usually occurs a few days to a week before labor begins.  Your cervix becomes thin and soft (effaced) near your due date. WHAT TO EXPECT AT YOUR PRENATAL EXAMS   You will have prenatal exams every 2 weeks until week 36. Then, you will have weekly prenatal exams. During a routine prenatal visit:  You will be weighed to make sure you and the fetus are growing normally.  Your blood pressure is taken.  Your abdomen will be measured to track your baby's growth.  The fetal heartbeat will be listened to.  Any test results from the previous visit will be discussed.  You may have a cervical check near your due date to see if you have effaced. At around 36 weeks, your caregiver will check your cervix. At the same time, your caregiver will also perform a test on the secretions of the vaginal tissue. This test is to determine if a type of bacteria, Group B streptococcus, is present. Your caregiver will explain this further. Your caregiver may ask you:  What your birth plan is.  How you are feeling.  If you are feeling the baby move.  If you have had any abnormal symptoms, such as leaking fluid, bleeding, severe headaches, or abdominal cramping.  If you have any questions. Other tests or screenings that may be performed during your third trimester include:  Blood tests that check for low iron levels (anemia).  Fetal testing to check the health, activity level, and growth of the fetus. Testing is done if you have certain medical conditions or if there are problems during the pregnancy. FALSE LABOR You may feel small, irregular contractions that eventually go away. These are called Braxton Hicks contractions, or false labor. Contractions may last for hours, days, or even weeks before true labor sets in. If contractions come at regular intervals, intensify, or become painful, it is best to be seen by your caregiver.  SIGNS OF LABOR   Menstrual-like cramps.  Contractions that are 5 minutes apart or less.  Contractions that start on the top of the uterus and spread down to the lower abdomen and back.  A sense of increased pelvic pressure or back  pain.  A watery or bloody mucus discharge that comes from the vagina. If you have any of these signs before the 37th week of pregnancy, call your caregiver right away. You need to go to the hospital to get checked immediately. HOME CARE INSTRUCTIONS   Avoid all smoking, herbs, alcohol, and unprescribed drugs. These chemicals affect the formation and growth of the baby.  Follow your caregiver's instructions regarding medicine use. There are medicines that are either safe or unsafe to take during pregnancy.  Exercise only as directed by your caregiver. Experiencing uterine cramps is a good sign to stop exercising.  Continue to eat regular, healthy meals.  Wear a good support bra for breast tenderness.  Do not use hot tubs, steam rooms, or saunas.  Wear your seat belt at all times when driving.  Avoid raw meat, uncooked cheese, cat litter boxes, and soil used by cats. These carry germs that can cause birth defects in the baby.  Take your  prenatal vitamins.  Try taking a stool softener (if your caregiver approves) if you develop constipation. Eat more high-fiber foods, such as fresh vegetables or fruit and whole grains. Drink plenty of fluids to keep your urine clear or pale yellow.  Take warm sitz baths to soothe any pain or discomfort caused by hemorrhoids. Use hemorrhoid cream if your caregiver approves.  If you develop varicose veins, wear support hose. Elevate your feet for 15 minutes, 3-4 times a day. Limit salt in your diet.  Avoid heavy lifting, wear low heal shoes, and practice good posture.  Rest a lot with your legs elevated if you have leg cramps or low back pain.  Visit your dentist if you have not gone during your pregnancy. Use a soft toothbrush to brush your teeth and be gentle when you floss.  A sexual relationship may be continued unless your caregiver directs you otherwise.  Do not travel far distances unless it is absolutely necessary and only with the approval  of your caregiver.  Take prenatal classes to understand, practice, and ask questions about the labor and delivery.  Make a trial run to the hospital.  Pack your hospital bag.  Prepare the baby's nursery.  Continue to go to all your prenatal visits as directed by your caregiver. SEEK MEDICAL CARE IF:  You are unsure if you are in labor or if your water has broken.  You have dizziness.  You have mild pelvic cramps, pelvic pressure, or nagging pain in your abdominal area.  You have persistent nausea, vomiting, or diarrhea.  You have a bad smelling vaginal discharge.  You have pain with urination. SEEK IMMEDIATE MEDICAL CARE IF:   You have a fever.  You are leaking fluid from your vagina.  You have spotting or bleeding from your vagina.  You have severe abdominal cramping or pain.  You have rapid weight loss or gain.  You have shortness of breath with chest pain.  You notice sudden or extreme swelling of your face, hands, ankles, feet, or legs.  You have not felt your baby move in over an hour.  You have severe headaches that do not go away with medicine.  You have vision changes. Document Released: 09/13/2001 Document Revised: 09/24/2013 Document Reviewed: 11/20/2012 [facility] Patient Information 2015 [place], [place]. This information is not intended to replace advice given to you by your health care provider. Make sure you discuss any questions you have with your health care provider.

## 2014-12-06 LAB — STREP B DNA PROBE: GBSP: DETECTED

## 2014-12-15 ENCOUNTER — Ambulatory Visit (INDEPENDENT_AMBULATORY_CARE_PROVIDER_SITE_OTHER): Payer: Medicaid Other | Admitting: *Deleted

## 2014-12-15 ENCOUNTER — Ambulatory Visit (INDEPENDENT_AMBULATORY_CARE_PROVIDER_SITE_OTHER): Payer: Self-pay | Admitting: Family Medicine

## 2014-12-15 VITALS — BP 104/72 | HR 106

## 2014-12-15 VITALS — BP 107/69 | HR 100 | Temp 98.3°F | Wt 174.1 lb

## 2014-12-15 DIAGNOSIS — Z3483 Encounter for supervision of other normal pregnancy, third trimester: Secondary | ICD-10-CM

## 2014-12-15 DIAGNOSIS — O368131 Decreased fetal movements, third trimester, fetus 1: Secondary | ICD-10-CM

## 2014-12-15 DIAGNOSIS — O368130 Decreased fetal movements, third trimester, not applicable or unspecified: Secondary | ICD-10-CM

## 2014-12-15 NOTE — Patient Instructions (Signed)
[facility] to have an NST performed for decreased fetal movement.  Vaginal Delivery During delivery, your health care provider will help you give birth to your baby. During a vaginal delivery, you will work to push the baby out of your vagina. However, before you can push your baby out, a few things need to happen. The opening of your uterus (cervix) has to soften, thin out, and open up (dilate) all the way to 10 cm. Also, your baby has to move down from the uterus into your vagina.  SIGNS OF LABOR  Your health care provider will first need to make sure you are in labor. Signs of labor include:   Passing what is called the mucous plug before labor begins. This is a small amount of blood-stained mucus.  Having regular, painful uterine contractions.   The time between contractions gets shorter.   The discomfort and pain gradually get more intense.  Contraction pains get worse when walking and do not go away when resting.   Your cervix becomes thinner (effacement) and dilates. BEFORE THE DELIVERY Once you are in labor and admitted into the hospital or care center, your health care provider may do the following:   Perform a complete physical exam.  Review any complications related to pregnancy or labor.  Check your blood pressure, pulse, temperature, and heart rate (vital signs).   Determine if, and when, the rupture of amniotic membranes occurred.  Do a vaginal exam (using a sterile glove and lubricant) to determine:   The position (presentation) of the baby. Is the baby's head presenting first (vertex) in the birth canal (vagina), or are the feet or buttocks first (breech)?   The level (station) of the baby's head within the birth canal.   The effacement and dilatation of the cervix.   An electronic fetal monitor is usually placed on your abdomen when you first arrive. This is used to monitor your contractions and the  baby's heart rate.  When the monitor is on your abdomen (external fetal monitor), it can only pick up the frequency and length of your contractions. It cannot tell the strength of your contractions.  If it becomes necessary for your health care provider to know exactly how strong your contractions are or to see exactly what the baby's heart rate is doing, an internal monitor may be inserted into your vagina and uterus. Your health care provider will discuss the benefits and risks of using an internal monitor and obtain your permission before inserting the device.  Continuous fetal monitoring may be needed if you have an epidural, are receiving certain medicines (such as oxytocin), or have pregnancy or labor complications.  An IV access tube may be placed into a vein in your arm to deliver fluids and medicines if necessary. THREE STAGES OF LABOR AND DELIVERY Normal labor and delivery is divided into three stages. First Stage This stage starts when you begin to contract regularly and your cervix begins to efface and dilate. It ends when your cervix is completely open (fully dilated). The first stage is the longest stage of labor and can last from 3 hours to 15 hours.  Several methods are available to help with labor pain. You and your health care provider will decide which option is best for you. Options include:   Opioid medicines. These are strong pain medicines that you can get through your IV tube or as a shot into your muscle. These medicines  lessen pain but do not make it go away completely.  Epidural. A medicine is given through a thin tube that is inserted in your back. The medicine numbs the lower part of your body and prevents any pain in that area.  Paracervical pain medicine. This is an injection of an anesthetic on each side of your cervix.   You may request natural childbirth, which does not involve the use of pain medicines or an epidural during labor and delivery. Instead, you  will use other things, such as breathing exercises, to help cope with the pain. Second Stage The second stage of labor begins when your cervix is fully dilated at 10 cm. It continues until you push your baby down through the birth canal and the baby is born. This stage can take only minutes or several hours.  The location of your baby's head as it moves through the birth canal is reported as a number called a station. If the baby's head has not started its descent, the station is described as being at minus 3 (-3). When your baby's head is at the zero station, it is at the middle of the birth canal and is engaged in the pelvis. The station of your baby helps indicate the progress of the second stage of labor.  When your baby is born, your health care provider may hold the baby with his or her head lowered to prevent amniotic fluid, mucus, and blood from getting into the baby's lungs. The baby's mouth and nose may be suctioned with a small bulb syringe to remove any additional fluid.  Your health care provider may then place the baby on your stomach. It is important to keep the baby from getting cold. To do this, the health care provider will dry the baby off, place the baby directly on your skin (with no blankets between you and the baby), and cover the baby with warm, dry blankets.   The umbilical cord is cut. Third Stage During the third stage of labor, your health care provider will deliver the placenta (afterbirth) and make sure your bleeding is under control. The delivery of the placenta usually takes about 5 minutes but can take up to 30 minutes. After the placenta is delivered, a medicine may be given either by IV or injection to help contract the uterus and control bleeding. If you are planning to breastfeed, you can try to do so now. After you deliver the placenta, your uterus should contract and get very firm. If your uterus does not remain firm, your health care provider will massage it.  This is important because the contraction of the uterus helps cut off bleeding at the site where the placenta was attached to your uterus. If your uterus does not contract properly and stay firm, you may continue to bleed heavily. If there is a lot of bleeding, medicines may be given to contract the uterus and stop the bleeding.  Document Released: 06/28/2008 Document Revised: 02/03/2014 Document Reviewed: 03/10/2013 [facility] Patient Information 2015 [place], [place]. This information is not intended to replace advice given to you by your health care provider. Make sure you discuss any questions you have with your health care provider.

## 2014-12-15 NOTE — Progress Notes (Signed)
[name] is a 29 y.o. [phone] at 142w4d for routine follow up.She reports no uterine contractions. She reports decreased fetal movement (once in the last 24 hours). See flow sheet for details. Complaints: Swelling in hands. No vision changes, RUQ pain, nausea or vomiting.  General: well appearing, no distress Extremities: 2+ pitting edema in bilateral ankles and shins. Bilateral non-pitting edema in hands Abdomen: gravid, no RUQ tenderness, no general tenderness, bowel sounds present  A/P: Pregnancy at 8235w1d. Decreased fetal movements. Edema  Infant feeding choice - breastfeed Contraception choice - nexplanon [facility]  Tdap was given already during pregnancy. GBS testing was performed today. Did not repeat GC/CT as patient has no RFs or h/o STIs.  Labor precautions reviewed. Kick counts reviewed. NST today Limit salt in diet; elevate legs when sitting/laying down. C-section in 1 week

## 2014-12-15 NOTE — Progress Notes (Signed)
[employer] Interpreter # [phone] used for encounter today.  Pt states she felt the baby move once this morning and none since. She felt good FM during NST

## 2014-12-16 NOTE — Progress Notes (Signed)
3/14 NST reviewed and reactive 

## 2014-12-18 ENCOUNTER — Encounter: Payer: Self-pay | Admitting: Obstetrics & Gynecology

## 2014-12-18 NOTE — Patient Instructions (Addendum)
   Your procedure is scheduled on:  Monday, March 21  [facility] through [facility] at:  11:30 AM Pick up the phone at the desk and dial [phone] and inform us of your arrival.  Please call this number if you have any problems the morning of surgery: [phone]  Remember: Do not eat food after midnight: Sunday Do not drink clear liquids after: 9 AM Monday, day of surgery Take these medicines the morning of surgery with a SIP OF WATER:  None  Do not wear jewelry, make-up, or FINGER nail polish No metal in your hair or on your body. Do not wear lotions, powders, perfumes.  You may wear deodorant.  Do not bring valuables to the hospital. Contacts, dentures or bridgework may not be worn into surgery.  Leave suitcase in the car. After Surgery it may be brought to your room. For patients being admitted to the hospital, checkout time is 11:00am the day of discharge.  Home with husband Khalafalla

## 2014-12-19 ENCOUNTER — Encounter (HOSPITAL_COMMUNITY): Payer: Self-pay

## 2014-12-19 ENCOUNTER — Encounter (HOSPITAL_COMMUNITY)
Admission: RE | Admit: 2014-12-19 | Discharge: 2014-12-19 | Disposition: A | Discharge status: Home / Self Care | Payer: Medicaid Other | Source: Ambulatory Visit | Attending: Obstetrics & Gynecology | Admitting: Obstetrics & Gynecology

## 2014-12-19 VITALS — BP 102/76 | HR 97 | Resp 18 | Ht 63.0 in | Wt 175.0 lb

## 2014-12-19 DIAGNOSIS — O34219 Maternal care for unspecified type scar from previous cesarean delivery: Secondary | ICD-10-CM

## 2014-12-19 DIAGNOSIS — Z01812 Encounter for preprocedural laboratory examination: Secondary | ICD-10-CM | POA: Insufficient documentation

## 2014-12-19 HISTORY — DX: Anemia, unspecified: D64.9

## 2014-12-19 LAB — CBC
HCT: 34.4 % — ABNORMAL LOW (ref 36.0–46.0)
Hemoglobin: 11.4 g/dL — ABNORMAL LOW (ref 12.0–15.0)
MCH: 24.2 pg — ABNORMAL LOW (ref 26.0–34.0)
MCHC: 33.1 g/dL (ref 30.0–36.0)
MCV: 73 fL — ABNORMAL LOW (ref 78.0–100.0)
Platelets: 237 10*3/uL (ref 150–400)
RBC: 4.71 MIL/uL (ref 3.87–5.11)
RDW: 19.5 % — ABNORMAL HIGH (ref 11.5–15.5)
WBC: 5.5 10*3/uL (ref 4.0–10.5)

## 2014-12-19 LAB — ABO/RH: ABO/RH(D): A POS

## 2014-12-19 LAB — TYPE AND SCREEN
ABO/RH(D): A POS
Antibody Screen: NEGATIVE

## 2014-12-19 NOTE — Pre-Procedure Instructions (Signed)
Vanessa FreshwaterFeryal Combs, Arabic Interpreter for the PAT appointment

## 2014-12-20 LAB — RPR: RPR Ser Ql: NONREACTIVE

## 2014-12-21 MED ORDER — DEXTROSE 5 % IV SOLN
2.0000 g | INTRAVENOUS | Status: AC
  Administered 2014-12-22: 2 g via INTRAVENOUS
  Filled 2014-12-21: qty 2

## 2014-12-22 ENCOUNTER — Inpatient Hospital Stay (HOSPITAL_COMMUNITY): Payer: Medicaid Other | Admitting: Anesthesiology

## 2014-12-22 ENCOUNTER — Encounter (HOSPITAL_COMMUNITY): Payer: Self-pay | Admitting: *Deleted

## 2014-12-22 ENCOUNTER — Inpatient Hospital Stay (HOSPITAL_COMMUNITY)
Admission: RE | Admit: 2014-12-22 | Discharge: 2014-12-24 | DRG: 766 | Disposition: A | Discharge status: Home / Self Care | Payer: Medicaid Other | Source: Ambulatory Visit | Attending: Obstetrics & Gynecology | Admitting: Obstetrics & Gynecology

## 2014-12-22 ENCOUNTER — Encounter (HOSPITAL_COMMUNITY)
Admission: RE | Disposition: A | Discharge status: Home / Self Care | Payer: Self-pay | Source: Ambulatory Visit | Attending: Obstetrics & Gynecology

## 2014-12-22 DIAGNOSIS — R200 Anesthesia of skin: Secondary | ICD-10-CM | POA: Diagnosis not present

## 2014-12-22 DIAGNOSIS — O9902 Anemia complicating childbirth: Secondary | ICD-10-CM | POA: Diagnosis not present

## 2014-12-22 DIAGNOSIS — O9989 Other specified diseases and conditions complicating pregnancy, childbirth and the puerperium: Secondary | ICD-10-CM | POA: Diagnosis not present

## 2014-12-22 DIAGNOSIS — Z98891 History of uterine scar from previous surgery: Secondary | ICD-10-CM

## 2014-12-22 DIAGNOSIS — D649 Anemia, unspecified: Secondary | ICD-10-CM | POA: Diagnosis present

## 2014-12-22 DIAGNOSIS — Z3A39 39 weeks gestation of pregnancy: Secondary | ICD-10-CM | POA: Diagnosis present

## 2014-12-22 DIAGNOSIS — O34219 Maternal care for unspecified type scar from previous cesarean delivery: Secondary | ICD-10-CM | POA: Diagnosis present

## 2014-12-22 DIAGNOSIS — N858 Other specified noninflammatory disorders of uterus: Secondary | ICD-10-CM | POA: Diagnosis present

## 2014-12-22 DIAGNOSIS — O3421 Maternal care for scar from previous cesarean delivery: Principal | ICD-10-CM | POA: Diagnosis present

## 2014-12-22 SURGERY — Surgical Case
Anesthesia: Spinal | Site: Abdomen

## 2014-12-22 MED ORDER — TETANUS-DIPHTH-ACELL PERTUSSIS 5-2.5-18.5 LF-MCG/0.5 IM SUSP: 0.5000 mL | Freq: Once | INTRAMUSCULAR | Status: DC

## 2014-12-22 MED ORDER — NALBUPHINE HCL 10 MG/ML IJ SOLN: 5.0000 mg | INTRAMUSCULAR | Status: DC | PRN

## 2014-12-22 MED ORDER — ZOLPIDEM TARTRATE 5 MG PO TABS: 5.0000 mg | ORAL_TABLET | Freq: Every evening | ORAL | Status: DC | PRN

## 2014-12-22 MED ORDER — MORPHINE SULFATE (PF) 0.5 MG/ML IJ SOLN
INTRAMUSCULAR | Status: DC | PRN
  Administered 2014-12-22: .15 mg via INTRATHECAL

## 2014-12-22 MED ORDER — ONDANSETRON HCL 4 MG/2ML IJ SOLN
INTRAMUSCULAR | Status: AC
  Filled 2014-12-22: qty 2

## 2014-12-22 MED ORDER — NALBUPHINE HCL 10 MG/ML IJ SOLN
INTRAMUSCULAR | Status: AC
  Administered 2014-12-22: 5 mg via INTRAVENOUS
  Filled 2014-12-22: qty 1

## 2014-12-22 MED ORDER — IBUPROFEN 600 MG PO TABS
600.0000 mg | ORAL_TABLET | Freq: Four times a day (QID) | ORAL | Status: DC
  Administered 2014-12-23 – 2014-12-24 (×7): 600 mg via ORAL
  Filled 2014-12-22 (×7): qty 1

## 2014-12-22 MED ORDER — PHENYLEPHRINE 8 MG IN D5W 100 ML (0.08MG/ML) PREMIX OPTIME
INJECTION | INTRAVENOUS | Status: DC | PRN
  Administered 2014-12-22: 60 ug/min via INTRAVENOUS

## 2014-12-22 MED ORDER — MEPERIDINE HCL 25 MG/ML IJ SOLN
INTRAMUSCULAR | Status: DC | PRN
  Administered 2014-12-22: 25 mg via INTRAVENOUS

## 2014-12-22 MED ORDER — NALOXONE HCL 1 MG/ML IJ SOLN: 1.0000 ug/kg/h | INTRAVENOUS | Status: DC | PRN

## 2014-12-22 MED ORDER — NALOXONE HCL 0.4 MG/ML IJ SOLN: 0.4000 mg | INTRAMUSCULAR | Status: DC | PRN

## 2014-12-22 MED ORDER — ACETAMINOPHEN 325 MG PO TABS: 325.0000 mg | ORAL_TABLET | ORAL | Status: DC | PRN

## 2014-12-22 MED ORDER — BUPIVACAINE HCL (PF) 0.5 % IJ SOLN
INTRAMUSCULAR | Status: AC
  Filled 2014-12-22: qty 30

## 2014-12-22 MED ORDER — PHENYLEPHRINE 8 MG IN D5W 100 ML (0.08MG/ML) PREMIX OPTIME
INJECTION | INTRAVENOUS | Status: AC
  Filled 2014-12-22: qty 100

## 2014-12-22 MED ORDER — KETOROLAC TROMETHAMINE 30 MG/ML IJ SOLN
30.0000 mg | Freq: Once | INTRAMUSCULAR | Status: AC | PRN
  Administered 2014-12-22: 30 mg via INTRAVENOUS

## 2014-12-22 MED ORDER — SCOPOLAMINE 1 MG/3DAYS TD PT72
MEDICATED_PATCH | TRANSDERMAL | Status: AC
  Administered 2014-12-22: 1.5 mg via TRANSDERMAL
  Filled 2014-12-22: qty 1

## 2014-12-22 MED ORDER — DIPHENHYDRAMINE HCL 25 MG PO CAPS: 25.0000 mg | ORAL_CAPSULE | Freq: Four times a day (QID) | ORAL | Status: DC | PRN

## 2014-12-22 MED ORDER — LACTATED RINGERS IV SOLN
INTRAVENOUS | Status: DC
  Administered 2014-12-22 (×2): via INTRAVENOUS

## 2014-12-22 MED ORDER — IBUPROFEN 600 MG PO TABS
600.0000 mg | ORAL_TABLET | Freq: Four times a day (QID) | ORAL | Status: DC | PRN
Start: 2014-12-22 — End: 2014-12-22

## 2014-12-22 MED ORDER — MEPERIDINE HCL 25 MG/ML IJ SOLN
INTRAMUSCULAR | Status: AC
  Filled 2014-12-22: qty 1

## 2014-12-22 MED ORDER — BUPIVACAINE HCL (PF) 0.5 % IJ SOLN
INTRAMUSCULAR | Status: DC | PRN
  Administered 2014-12-22: 30 mL

## 2014-12-22 MED ORDER — SIMETHICONE 80 MG PO CHEW
80.0000 mg | CHEWABLE_TABLET | ORAL | Status: DC
  Administered 2014-12-23 (×2): 80 mg via ORAL
  Filled 2014-12-22 (×2): qty 1

## 2014-12-22 MED ORDER — MEPERIDINE HCL 25 MG/ML IJ SOLN: 6.2500 mg | INTRAMUSCULAR | Status: DC | PRN

## 2014-12-22 MED ORDER — OXYTOCIN 10 UNIT/ML IJ SOLN
INTRAMUSCULAR | Status: AC
  Filled 2014-12-22: qty 4

## 2014-12-22 MED ORDER — SIMETHICONE 80 MG PO CHEW: 80.0000 mg | CHEWABLE_TABLET | ORAL | Status: DC | PRN

## 2014-12-22 MED ORDER — SODIUM CHLORIDE 0.9 % IJ SOLN: 3.0000 mL | INTRAMUSCULAR | Status: DC | PRN

## 2014-12-22 MED ORDER — DIPHENHYDRAMINE HCL 25 MG PO CAPS: 25.0000 mg | ORAL_CAPSULE | ORAL | Status: DC | PRN

## 2014-12-22 MED ORDER — SCOPOLAMINE 1 MG/3DAYS TD PT72: 1.0000 | MEDICATED_PATCH | Freq: Once | TRANSDERMAL | Status: DC

## 2014-12-22 MED ORDER — LACTATED RINGERS IV SOLN
INTRAVENOUS | Status: DC
  Administered 2014-12-22: 999 mL via INTRAVENOUS

## 2014-12-22 MED ORDER — PRENATAL MULTIVITAMIN CH
1.0000 | ORAL_TABLET | Freq: Every day | ORAL | Status: DC
Start: 2014-12-23 — End: 2014-12-24
  Administered 2014-12-23: 1 via ORAL
  Filled 2014-12-22: qty 1

## 2014-12-22 MED ORDER — OXYTOCIN 10 UNIT/ML IJ SOLN
40.0000 [IU] | INTRAVENOUS | Status: DC | PRN
  Administered 2014-12-22: 40 [IU] via INTRAVENOUS

## 2014-12-22 MED ORDER — PROMETHAZINE HCL 25 MG/ML IJ SOLN: 6.2500 mg | INTRAMUSCULAR | Status: DC | PRN

## 2014-12-22 MED ORDER — ACETAMINOPHEN 160 MG/5ML PO SOLN: 325.0000 mg | ORAL | Status: DC | PRN

## 2014-12-22 MED ORDER — SCOPOLAMINE 1 MG/3DAYS TD PT72
1.0000 | MEDICATED_PATCH | Freq: Once | TRANSDERMAL | Status: DC
  Administered 2014-12-22: 1.5 mg via TRANSDERMAL

## 2014-12-22 MED ORDER — MIDAZOLAM HCL 2 MG/2ML IJ SOLN: 0.5000 mg | Freq: Once | INTRAMUSCULAR | Status: DC | PRN

## 2014-12-22 MED ORDER — MEASLES, MUMPS & RUBELLA VAC ~~LOC~~ INJ: 0.5000 mL | INJECTION | Freq: Once | SUBCUTANEOUS | Status: DC

## 2014-12-22 MED ORDER — KETOROLAC TROMETHAMINE 30 MG/ML IJ SOLN: 30.0000 mg | Freq: Four times a day (QID) | INTRAMUSCULAR | Status: AC | PRN

## 2014-12-22 MED ORDER — ACETAMINOPHEN 500 MG PO TABS
1000.0000 mg | ORAL_TABLET | Freq: Four times a day (QID) | ORAL | Status: AC
  Administered 2014-12-23 (×3): 1000 mg via ORAL
  Filled 2014-12-22 (×3): qty 2

## 2014-12-22 MED ORDER — MENTHOL 3 MG MT LOZG: 1.0000 | LOZENGE | OROMUCOSAL | Status: DC | PRN

## 2014-12-22 MED ORDER — KETOROLAC TROMETHAMINE 30 MG/ML IJ SOLN
INTRAMUSCULAR | Status: AC
  Administered 2014-12-22: 30 mg via INTRAVENOUS
  Filled 2014-12-22: qty 1

## 2014-12-22 MED ORDER — SENNOSIDES-DOCUSATE SODIUM 8.6-50 MG PO TABS
2.0000 | ORAL_TABLET | ORAL | Status: DC
  Administered 2014-12-23 (×2): 2 via ORAL
  Filled 2014-12-22 (×2): qty 2

## 2014-12-22 MED ORDER — OXYCODONE-ACETAMINOPHEN 5-325 MG PO TABS: 2.0000 | ORAL_TABLET | ORAL | Status: DC | PRN

## 2014-12-22 MED ORDER — DIPHENHYDRAMINE HCL 50 MG/ML IJ SOLN: 12.5000 mg | INTRAMUSCULAR | Status: DC | PRN

## 2014-12-22 MED ORDER — NALBUPHINE HCL 10 MG/ML IJ SOLN: 5.0000 mg | Freq: Once | INTRAMUSCULAR | Status: AC | PRN

## 2014-12-22 MED ORDER — LANOLIN HYDROUS EX OINT: 1.0000 "application " | TOPICAL_OINTMENT | CUTANEOUS | Status: DC | PRN

## 2014-12-22 MED ORDER — FENTANYL CITRATE 0.05 MG/ML IJ SOLN: 25.0000 ug | INTRAMUSCULAR | Status: DC | PRN

## 2014-12-22 MED ORDER — NALBUPHINE HCL 10 MG/ML IJ SOLN
5.0000 mg | Freq: Once | INTRAMUSCULAR | Status: AC | PRN
  Administered 2014-12-22: 5 mg via INTRAVENOUS

## 2014-12-22 MED ORDER — ACETAMINOPHEN 325 MG PO TABS: 650.0000 mg | ORAL_TABLET | ORAL | Status: DC | PRN

## 2014-12-22 MED ORDER — MORPHINE SULFATE 0.5 MG/ML IJ SOLN
INTRAMUSCULAR | Status: AC
  Filled 2014-12-22: qty 10

## 2014-12-22 MED ORDER — LACTATED RINGERS IV SOLN
Freq: Once | INTRAVENOUS | Status: AC
  Administered 2014-12-22: 12:00:00 via INTRAVENOUS

## 2014-12-22 MED ORDER — ONDANSETRON HCL 4 MG/2ML IJ SOLN: 4.0000 mg | Freq: Three times a day (TID) | INTRAMUSCULAR | Status: DC | PRN

## 2014-12-22 MED ORDER — WITCH HAZEL-GLYCERIN EX PADS: 1.0000 "application " | MEDICATED_PAD | CUTANEOUS | Status: DC | PRN

## 2014-12-22 MED ORDER — OXYTOCIN 40 UNITS IN LACTATED RINGERS INFUSION - SIMPLE MED: 62.5000 mL/h | INTRAVENOUS | Status: AC

## 2014-12-22 MED ORDER — LACTATED RINGERS IV SOLN
INTRAVENOUS | Status: DC | PRN
  Administered 2014-12-22: 14:00:00 via INTRAVENOUS

## 2014-12-22 MED ORDER — BUPIVACAINE IN DEXTROSE 0.75-8.25 % IT SOLN
INTRATHECAL | Status: DC | PRN
  Administered 2014-12-22: 1.5 mL via INTRATHECAL

## 2014-12-22 MED ORDER — DIBUCAINE 1 % RE OINT: 1.0000 "application " | TOPICAL_OINTMENT | RECTAL | Status: DC | PRN

## 2014-12-22 MED ORDER — ONDANSETRON HCL 4 MG/2ML IJ SOLN
INTRAMUSCULAR | Status: DC | PRN
  Administered 2014-12-22: 4 mg via INTRAVENOUS

## 2014-12-22 MED ORDER — FENTANYL CITRATE 0.05 MG/ML IJ SOLN
INTRAMUSCULAR | Status: AC
  Filled 2014-12-22: qty 2

## 2014-12-22 MED ORDER — FENTANYL CITRATE 0.05 MG/ML IJ SOLN
INTRAMUSCULAR | Status: DC | PRN
  Administered 2014-12-22: 25 ug via INTRATHECAL

## 2014-12-22 MED ORDER — MAGNESIUM HYDROXIDE 400 MG/5ML PO SUSP: 30.0000 mL | ORAL | Status: DC | PRN

## 2014-12-22 MED ORDER — OXYCODONE-ACETAMINOPHEN 5-325 MG PO TABS
1.0000 | ORAL_TABLET | ORAL | Status: DC | PRN
  Administered 2014-12-23 – 2014-12-24 (×4): 1 via ORAL
  Filled 2014-12-22 (×4): qty 1

## 2014-12-22 SURGICAL SUPPLY — 36 items
BENZOIN TINCTURE PRP APPL 2/3 (GAUZE/BANDAGES/DRESSINGS) ×2 IMPLANT
CLAMP CORD UMBIL (MISCELLANEOUS) IMPLANT
CLOTH BEACON ORANGE TIMEOUT ST (SAFETY) ×2 IMPLANT
DRAPE SHEET LG 3/4 BI-LAMINATE (DRAPES) IMPLANT
DRSG OPSITE POSTOP 4X10 (GAUZE/BANDAGES/DRESSINGS) ×2 IMPLANT
DURAPREP 26ML APPLICATOR (WOUND CARE) ×2 IMPLANT
ELECT REM PT RETURN 9FT ADLT (ELECTROSURGICAL) ×2
ELECTRODE REM PT RTRN 9FT ADLT (ELECTROSURGICAL) ×1 IMPLANT
EXTRACTOR VACUUM M CUP 4 TUBE (SUCTIONS) IMPLANT
GLOVE BIOGEL PI IND STRL 7.0 (GLOVE) ×2 IMPLANT
GLOVE BIOGEL PI INDICATOR 7.0 (GLOVE) ×2
GLOVE ECLIPSE 7.0 STRL STRAW (GLOVE) ×2 IMPLANT
GOWN STRL REUS W/TWL LRG LVL3 (GOWN DISPOSABLE) ×4 IMPLANT
KIT ABG SYR 3ML LUER SLIP (SYRINGE) IMPLANT
NEEDLE HYPO 22GX1.5 SAFETY (NEEDLE) ×2 IMPLANT
NEEDLE HYPO 25X5/8 SAFETYGLIDE (NEEDLE) ×2 IMPLANT
NS IRRIG 1000ML POUR BTL (IV SOLUTION) ×2 IMPLANT
PACK C SECTION WH (CUSTOM PROCEDURE TRAY) ×2 IMPLANT
PAD ABD 7.5X8 STRL (GAUZE/BANDAGES/DRESSINGS) ×2 IMPLANT
PAD OB MATERNITY 4.3X12.25 (PERSONAL CARE ITEMS) ×2 IMPLANT
RTRCTR C-SECT PINK 25CM LRG (MISCELLANEOUS) IMPLANT
SPONGE GAUZE 4X4 12PLY STER LF (GAUZE/BANDAGES/DRESSINGS) ×2 IMPLANT
STAPLER VISISTAT 35W (STAPLE) IMPLANT
STRIP CLOSURE SKIN 1/2X4 (GAUZE/BANDAGES/DRESSINGS) ×2 IMPLANT
SUT PDS AB 0 CT1 27 (SUTURE) IMPLANT
SUT PDS AB 0 CTX 36 PDP370T (SUTURE) IMPLANT
SUT PLAIN 2 0 XLH (SUTURE) IMPLANT
SUT VIC AB 0 CT1 36 (SUTURE) ×6 IMPLANT
SUT VIC AB 0 CTX 36 (SUTURE) ×2
SUT VIC AB 0 CTX36XBRD ANBCTRL (SUTURE) ×2 IMPLANT
SUT VIC AB 2-0 CT1 27 (SUTURE) ×2
SUT VIC AB 2-0 CT1 TAPERPNT 27 (SUTURE) ×2 IMPLANT
SUT VIC AB 4-0 KS 27 (SUTURE) ×2 IMPLANT
SYR CONTROL 10ML LL (SYRINGE) ×2 IMPLANT
TOWEL OR 17X24 6PK STRL BLUE (TOWEL DISPOSABLE) ×2 IMPLANT
TRAY FOLEY CATH 14FR (SET/KITS/TRAYS/PACK) ×2 IMPLANT

## 2014-12-22 NOTE — Anesthesia Postprocedure Evaluation (Signed)
Anesthesia Post Note  Patient: [name]  Procedure(s) Performed: Procedure(s) (LRB): CESAREAN SECTION (N/A)  Anesthesia type: Spinal  Patient location: PACU  Post pain: Pain level controlled  Post assessment: Post-op Vital signs reviewed  Last Vitals:  Filed Vitals:   12/22/14 1445  BP: 102/70  Pulse: 107  Temp:   Resp: 12    Post vital signs: Reviewed  Level of consciousness: awake  Complications: No apparent anesthesia complications

## 2014-12-22 NOTE — Transfer of Care (Signed)
Immediate Anesthesia Transfer of Care Note  Patient: [name]  Procedure(s) Performed: Procedure(s): CESAREAN SECTION (N/A)  Patient Location: PACU  Anesthesia Type:Spinal  Level of Consciousness: awake, alert  and oriented  Airway & Oxygen Therapy: Patient Spontanous Breathing  Post-op Assessment: Report given to RN and Post -op Vital signs reviewed and stable  Post vital signs: Reviewed and stable  Last Vitals:  Filed Vitals:   12/22/14 1209  BP: 108/78  Temp: 36.8 C  Resp: 18    Complications: No apparent anesthesia complications

## 2014-12-22 NOTE — Anesthesia Preprocedure Evaluation (Signed)
Anesthesia Evaluation  Patient identified by MRN, date of birth, ID band Patient awake    Reviewed: Allergy & Precautions, H&P , NPO status , Patient's Chart, lab work & pertinent test results  Airway Mallampati: II       Dental   Pulmonary  breath sounds clear to auscultation        Cardiovascular Exercise Tolerance: Good Rhythm:regular Rate:Normal     Neuro/Psych    GI/Hepatic   Endo/Other    Renal/GU      Musculoskeletal   Abdominal   Peds  Hematology  (+) anemia ,   Anesthesia Other Findings   Reproductive/Obstetrics (+) Pregnancy                             Anesthesia Physical Anesthesia Plan  ASA: II  Anesthesia Plan: Spinal   Post-op Pain Management:    Induction:   Airway Management Planned:   Additional Equipment:   Intra-op Plan:   Post-operative Plan:   Informed Consent: I have reviewed the patients History and Physical, chart, labs and discussed the procedure including the risks, benefits and alternatives for the proposed anesthesia with the patient or authorized representative who has indicated his/her understanding and acceptance.     Plan Discussed with: Anesthesiologist, CRNA and Surgeon  Anesthesia Plan Comments:         Anesthesia Quick Evaluation  

## 2014-12-22 NOTE — H&P (Signed)
LABOR ADMISSION HISTORY AND PHYSICAL  [name] is a 29 y.o. female [phone] with IUP at [redacted]w[redacted]d by LMP c/w early sono presenting for repeat cesarean section after 2 previous. She reports +FMs, No LOF, no VB, no blurry vision, headaches or peripheral edema, and RUQ pain.  She plans on breast and bottle feeding. She request undecided for birth control.  Dating: By LMP c/w early  --->  Estimated Date of Delivery: 12/28/14   Prenatal History/Complications:  Past Medical History: Past Medical History  Diagnosis Date  . Anemia     Past Surgical History: Past Surgical History  Procedure Laterality Date  . Cesarean section      x 2 both in [place]    Obstetrical History: OB History    Gravida Para Term Preterm AB TAB SAB Ectopic Multiple Living   0 1 0 1 0 0 2      Obstetric Comments   In [place]      Social History: History   Social History  . Marital Status: Married    Spouse Name: N/A  . Number of Children: N/A  . Years of Education: N/A   Social History Main Topics  . Smoking status: Never Smoker   . Smokeless tobacco: Never Used  . Alcohol Use: No  . Drug Use: No  . Sexual Activity: Not Currently    Birth Control/ Protection: None     Comment: pregnant   Other Topics Concern  . 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 No pertinent family  history.  Allergies: No Known Allergies  Prescriptions prior to admission  Medication Sig Dispense Refill Last Dose  . Prenatal Multivit-Min-Fe-FA (PRENATAL/IRON) TABS Take 1 tablet by mouth daily. 30 each 7 Past Week at Unknown time     Review of Systems   All systems reviewed and negative except as stated in HPI  Blood pressure 108/78, temperature 98.2 F (36.8 C), temperature source Oral, resp. rate 18, last menstrual period 03/31/2014, SpO2 100 %. General appearance: alert and cooperative Lungs: clear to auscultation bilaterally Heart: regular rate and rhythm Abdomen: soft, non-tender; bowel sounds normal Extremities: Homans sign is negative, no sign of DVT     Prenatal labs: ABO, Rh: --/--/A POS, A POS (03/18 0950) Antibody: NEG (03/18 0950) Rubella:   RPR: Non Reactive (03/18 0950)  HBsAg: NEGATIVE (11/10 1244)  HIV: NONREACTIVE (01/12 1046)  GBS: Detected (03/03 1150)  1 hr Glucola 146 with normal 3h Genetic screening  Not done, unclear if declined Anatomy US normal   Clinic Family medicine Prenatal Labs  Dating LMP + early u/s Blood type: A/POS/-- (11/10 1244)   Genetic Screen 1 Screen:    AFP:     Quad:     NIPS: Antibody:NEG (11/10 1244)  Anatomic US WNL Rubella: <0.10 (11/10 1244)  GTT Early:  Third trimester: 146---normal 3 hour RPR: NON REAC (01/12 1046)   Flu vaccine 09/03/14 HBsAg: NEGATIVE (11/10 1244)   TDaP vaccine  10/14/13                                             HIV: NONREACTIVE (01/12 1046)   GBS                                              (For PCN allergy, check sensitivities) GBS:   Contraception  Pap:  Baby Food    Circumcision    Pediatrician    Support Person        No results found for this or any previous visit (from the past 24 hour(s)).  Patient Active Problem List   Diagnosis Date Noted  . Previous cesarean delivery, antepartum condition or complication 11/12/2014  . Anemia of pregnancy 09/03/2014  .  Immigrant with language difficulty 08/04/2014  . Language barrier 08/04/2014  . Supervision of other normal pregnancy 08/04/2014    Assessment: [name] is a 29 y.o. [id number] at 9023w1d here for repeat c/s after 2 previous  #MOF: breast and bottle #MOC:undecided  The risks of cesarean section discussed with the patient included but were not limited to: bleeding which may require transfusion or reoperation; infection which may require antibiotics; injury to bowel, bladder, ureters or other surrounding organs; injury to the fetus; need for additional procedures including hysterectomy in the event of a life-threatening hemorrhage; placental abnormalities wth subsequent pregnancies, incisional problems, thromboembolic phenomenon and other postoperative/anesthesia complications. The patient concurred with the proposed plan, giving informed written consent for the procedure.   Patient has been NPO since 11PM she will remain NPO for procedure. Anesthesia and OR aware. Preoperative prophylactic antibiotics and SCDs ordered on call to the OR.  To OR when ready.    [name] [name] 12/22/2014, 12:14 PM

## 2014-12-22 NOTE — Op Note (Signed)
[name] PROCEDURE DATE: 12/22/2014  PREOPERATIVE DIAGNOSES: Intrauterine pregnancy at [redacted]w[redacted]d weeks gestation; previous cesarean section x 2  POSTOPERATIVE DIAGNOSES: The same  PROCEDURE: Repeat Low Transverse Cesarean Section  SURGEON:  Dr. Jaynie Collins  ASSISTANT:  Dr. Fredirick Lathe, Dr. Felix Pacini (Family Medicine PGY-3)  ANESTHESIOLOGIST: Dr.  Brayton Caves  INDICATIONS: [name] is a 29 y.o. [phone] at [redacted]w[redacted]d here for repeat cesarean section secondary to the indications listed under preoperative diagnoses; please see preoperative note for further details.  The risks of cesarean section were discussed with the patient including but were not limited to: bleeding which may require transfusion or reoperation; infection which may require antibiotics; injury to bowel, bladder, ureters or other surrounding organs; injury to the fetus; need for additional procedures including hysterectomy in the event of a life-threatening hemorrhage; placental abnormalities wth subsequent pregnancies, incisional problems, thromboembolic phenomenon and other postoperative/anesthesia complications.   The patient concurred with the proposed plan, giving informed written consent for the procedure.    FINDINGS:  Viable female infant in cephalic presentation.  Apgars 9 and 9, weight 7 lb 4 oz.  Clear amniotic fluid.  Intact placenta, three vessel cord.  Normal uterus, fallopian tubes and ovaries bilaterally. Moderate adhesive disease involving omentum, anterior abdominal wall and anterior fundus; lysed using suture ligation, electrocautery and blunt methods.  ANESTHESIA: Spinal INTRAVENOUS FLUIDS: 2300 ml ESTIMATED BLOOD LOSS: 700 ml URINE OUTPUT:  300 ml SPECIMENS: Placenta sent to pathology COMPLICATIONS: None immediate  PROCEDURE IN DETAIL:  The patient preoperatively received intravenous antibiotics and had sequential compression devices applied to her lower extremities.  She was then taken to the  operating room where spinal anesthesia was administered and was found to be adequate. She was then placed in a dorsal supine position with a leftward tilt, and prepped and draped in a sterile manner.  A foley catheter was placed into her bladder and attached to constant gravity.  After an adequate timeout was performed, a Pfannenstiel skin incision was made with scalpel and carried through to the underlying layer of fascia. The fascia was incised in the midline, and this incision was extended bilaterally using the Mayo scissors.  Kocher clamps were applied to the superior aspect of the fascial incision and the underlying rectus muscles were dissected off bluntly. A similar process was carried out on the inferior aspect of the fascial incision. The rectus muscles were separated in the midline bluntly and the peritoneum was entered sharply. Attention was turned to the lower uterine segment where adhesions were lysed as mentioned above.  A low transverse hysterotomy was made with a scalpel and extended bilaterally bluntly.  The infant was successfully delivered, the cord was clamped and cut and the infant was handed over to awaiting neonatology team. Uterine massage was then administered, and the placenta delivered intact with a three-vessel cord. The uterus was then cleared of clot and debris.  The hysterotomy was closed with 0 Vicryl in a running locked fashion, and an imbricating layer was also placed with 0 Vicryl.  Figure of eight serosal stitches were placed for hemostasis. The pelvis was cleared of all clot and debris. Hemostasis was confirmed on all surfaces.  The peritoneum and the muscles were reapproximated using 0 Vicryl interrupted stitches. The fascia was then closed using 0 PDS in a running fashion.  The subcutaneous layer was irrigated, and 30 ml of 0.5% Marcaine was injected subcutaneously around the incision.  The skin was closed with a 4-0 Vicryl subcuticular stitch.  The patient tolerated the  procedure well. Sponge, lap, instrument and needle counts were correct x 2.  She was taken to the recovery room in stable condition.    Jaynie CollinsUGONNA  Vanessa Glomski, MD, FACOG Attending Obstetrician & Gynecologist Faculty Practice, [facility]

## 2014-12-22 NOTE — Anesthesia Procedure Notes (Signed)
Spinal Patient location during procedure: OR Start time: 12/22/2014 1:06 PM Staffing Anesthesiologist: Brayton CavesJACKSON, Roxann Vierra Performed by: anesthesiologist  Preanesthetic Checklist Completed: patient identified, site marked, surgical consent, pre-op evaluation, timeout performed, IV checked, risks and benefits discussed and monitors and equipment checked Spinal Block Patient position: sitting Prep: DuraPrep Patient monitoring: heart rate, cardiac monitor, continuous pulse ox and blood pressure Approach: midline Location: L3-4 Injection technique: single-shot Needle Needle type: Sprotte  Needle gauge: 24 G Needle length: 9 cm Assessment Sensory level: T4 Additional Notes Patient identified.  Risk benefits discussed including failed block, incomplete pain control, headache, nerve damage, paralysis, blood pressure changes, nausea, vomiting, reactions to medication both toxic or allergic, and postpartum back pain.  Patient expressed understanding and wished to proceed.  All questions were answered.  Sterile technique used throughout procedure.  CSF was clear.  No parasthesia or other complications.  Please see nursing notes for vital signs.

## 2014-12-22 NOTE — Progress Notes (Signed)
At 2035, tried 2 times to get interpreter on phone; was able to get through to company but could not get interpreter.First time voice mail; second time, no pick up.  Will attempt again later.  Feeling is back in legs; delayed getting up because family and her children came to visit.  Will get patient up as soon as they leave.

## 2014-12-22 NOTE — Lactation Note (Signed)
This note was copied from the chart of Vanessa Floria Ravelingajat Magner. Lactation Consultation Note  Attempted [employer] x2 and poor reception. Family friend in room interpreted Arabic. P3. Ex BF. Breastfed oldest child for 1 year and 8 months and second child 1 year 2 months. Mother has baby latched upon entering.  Sucks and some swallows observed. Mother states she knows how to hand express. Mother denies problems or soreness. Mom encouraged to feed baby 8-12 times/24 hours and with feeding cues.  Mom made aware of O/P services, breastfeeding support groups, community resources, and our phone # for post-discharge questions.    Patient Name: [name] [id number]'s Date: 12/22/2014 Reason for consult: Initial assessment   Maternal Data Has patient been taught Hand Expression?: Yes Does the patient have breastfeeding experience prior to this delivery?: Yes  Feeding Feeding Type: Breast Fed Length of feed: 25 min  LATCH Score/Interventions Latch: Grasps breast easily, tongue down, lips flanged, rhythmical sucking. (latched upon entering)  Audible Swallowing: A few with stimulation  Type of Nipple: Everted at rest and after stimulation  Comfort (Breast/Nipple): Soft / non-tender     Hold (Positioning): No assistance needed to correctly position infant at breast.  LATCH Score: 9  Lactation Tools Discussed/Used     Consult Status Consult Status: Follow-up Date: 12/23/14 Follow-up type: In-patient    Dahlia ByesBerkelhammer, Dorothia [facility] 12/22/2014, 10:18 PM

## 2014-12-23 ENCOUNTER — Encounter (HOSPITAL_COMMUNITY): Payer: Self-pay | Admitting: Obstetrics & Gynecology

## 2014-12-23 LAB — CBC
HCT: 29.5 % — ABNORMAL LOW (ref 36.0–46.0)
Hemoglobin: 9.6 g/dL — ABNORMAL LOW (ref 12.0–15.0)
MCH: 24.4 pg — ABNORMAL LOW (ref 26.0–34.0)
MCHC: 32.5 g/dL (ref 30.0–36.0)
MCV: 74.9 fL — ABNORMAL LOW (ref 78.0–100.0)
Platelets: 180 10*3/uL (ref 150–400)
RBC: 3.94 MIL/uL (ref 3.87–5.11)
RDW: 18.3 % — ABNORMAL HIGH (ref 11.5–15.5)
WBC: 7.2 10*3/uL (ref 4.0–10.5)

## 2014-12-23 NOTE — Progress Notes (Addendum)
[employer] interpreter used.  I was first told to "call back in 15 minutes or 2 hours," because "they had all logged off."  I persisted and one was eventually found.  Explained that foley was staying in because of low urine output; mom is on lactated ringers and has been encouraged to drink. Did get her up to bathroom for pericare.    Through interpreter, Mom asked for formula supplementation for the baby.  Said she was unable to express by hand, but is willing to use an electric pump (BF her first baby several months).  I explained how to supplement and amounts.  Mom will put baby to breast first, then supplement 7-10 mls, then pump.  Has been seen by lactation.

## 2014-12-23 NOTE — Anesthesia Postprocedure Evaluation (Signed)
Anesthesia Post Note  Patient: [name]  Procedure(s) Performed: Procedure(s) (LRB): CESAREAN SECTION (N/A)  Anesthesia type: Spinal  Patient location: Mother/Baby  Post pain: Pain level controlled  Post assessment: Post-op Vital signs reviewed  Last Vitals:  Filed Vitals:   12/23/14 0940  BP: 94/54  Pulse: 79  Temp: 36.8 C  Resp: 18    Post vital signs: Reviewed  Level of consciousness: awake  Complications: No apparent anesthesia complications

## 2014-12-23 NOTE — Progress Notes (Signed)
Was able to connect with interpreter through [employer]. Explained orthostatic pressures, when foley will be taken out, pain assessment of mom

## 2014-12-23 NOTE — Addendum Note (Signed)
Addendum  created 12/23/14 1036 by Graciela HusbandsWynn O Bellamy Rubey, CRNA   Modules edited: Notes Section   Notes Section:  File: [id number]

## 2014-12-23 NOTE — Progress Notes (Signed)
Subjective: Postpartum Day 1: Cesarean Delivery [employer] line interpreter used for visit Patient reports tolerating PO and + flatus.  Has ambulated without difficulty. Low urine output, foley in place with ~ 200 cc amber colored fluid in bag. She is complaining of mild 3rd/4th finger "numbness" of left hand that started yesterday evening. Pain is controlled.   Objective: Vital signs in last 24 hours: Temp:  [97.8 F (36.6 C)-98.8 F (37.1 C)] 98.1 F (36.7 C) (03/22 0440) Pulse Rate:  [79-122] 79 (03/22 0440) Resp:  [12-19] 18 (03/22 0040) BP: (95-114)/(58-94) 95/58 mmHg (03/22 0440) SpO2:  [97 %-100 %] 100 % (03/22 0440)  Physical Exam:  General: alert, cooperative and no distress Lochia: appropriate Uterine Fundus: firm, 2 finger breaths  below umbilicus Incision: healing well, no significant drainage DVT Evaluation: No evidence of DVT seen on physical exam. Negative Homan's sign. No cords or calf tenderness. Ext: no erythema, no edema, no TTP, dull numbness to 3rd and 4th fingers left hand. Normal ROM. Good cap refill (ink on fingers)  Recent Labs  12/23/14 0630  HGB 9.6*  HCT 29.5*    Assessment/Plan: Status post Cesarean section. Doing well postoperatively.  Monitor numbness of left 3rd/4th fingers.  Deciding on contraception (if any), will readdress tomorrow.  Continue current care.  Healthy female infant. Breast and bottle feeding.  Likely DC home tomorrow afternoon.  [name], [name] 12/23/2014, 7:40 AM

## 2014-12-24 ENCOUNTER — Encounter (HOSPITAL_COMMUNITY): Payer: Self-pay | Admitting: Obstetrics & Gynecology

## 2014-12-24 MED ORDER — NORETHINDRONE 0.35 MG PO TABS: 1.0000 | ORAL_TABLET | Freq: Every day | ORAL | Status: DC

## 2014-12-24 MED ORDER — DOCUSATE SODIUM 100 MG PO CAPS: 100.0000 mg | ORAL_CAPSULE | Freq: Two times a day (BID) | ORAL | Status: DC

## 2014-12-24 MED ORDER — IBUPROFEN 600 MG PO TABS
600.0000 mg | ORAL_TABLET | Freq: Four times a day (QID) | ORAL | Status: DC
Start: 2014-12-24 — End: 2015-12-08

## 2014-12-24 MED ORDER — OXYCODONE-ACETAMINOPHEN 5-325 MG PO TABS: 1.0000 | ORAL_TABLET | ORAL | Status: DC | PRN

## 2014-12-24 NOTE — Progress Notes (Signed)
Subjective: Postpartum Day 2: Cesarean Delivery Patient reports incisional pain, tolerating PO, + flatus and no problems voiding.    Objective: Vital signs in last 24 hours: Temp:  [97.9 F (36.6 C)-99 F (37.2 C)] 97.9 F (36.6 C) (03/23 0515) Pulse Rate:  [79-105] 105 (03/23 0515) Resp:  [18] 18 (03/23 0515) BP: (94-107)/(54-67) 105/67 mmHg (03/23 0515) SpO2:  [100 %] 100 % (03/23 0515)  Physical Exam:  General: alert and cooperative Lochia: appropriate Uterine Fundus: firm Incision: healing well, no significant drainage DVT Evaluation: No evidence of DVT seen on physical exam. Negative Homan's sign.   Recent Labs  12/23/14 0630  HGB 9.6*  HCT 29.5*    Assessment/Plan: Status post Cesarean section. Doing well postoperatively.  Finger numbness: resolved.  Interpreter line used, all questions answered. Pt desires BCP for contraception- will prescribe minipill to start in 2 weeks.  Discharge home with standard precautions and return to clinic in 4-6 weeks.  Felix PaciniKuneff, Cartier Washko 12/24/2014, 7:37 AM

## 2014-12-24 NOTE — Discharge Instructions (Signed)
Cesarean Delivery  Cesarean delivery is the birth of a baby through a cut (incision) in the abdomen and womb (uterus).  LET YOUR HEALTH CARE PROVIDER KNOW ABOUT:  All medicines you are taking, including vitamins, herbs, eye drops, creams, and over-the-counter medicines.  Previous problems you or members of your family have had with the use of anesthetics.  Any blood disorders you have.  Previous surgeries you have had.  Medical conditions you have.  Any allergies you have.  Complicationsinvolving the pregnancy. RISKS AND COMPLICATIONS  Generally, this is a safe procedure. However, as with any procedure, complications can occur. Possible complications include:  Bleeding.  Infection.  Blood clots.  Injury to surrounding organs.  Problems with anesthesia.  Injury to the baby. BEFORE THE PROCEDURE   You may be given an antacid medicine to drink. This will prevent acid contents in your stomach from going into your lungs if you vomit during the surgery.  You may be given an antibiotic medicine to prevent infection. PROCEDURE   Hair may be removed from your pubic area and your lower abdomen. This is to prevent infection in the incision site.  A tube (Foley catheter) will be placed in your bladder to drain your urine from your bladder into a bag. This keeps your bladder empty during surgery.  An IV tube will be placed in your vein.  You may be given medicine to numb the lower half of your body (regional anesthetic). If you were in labor, you may have already had an epidural in place which can be used in both labor and cesarean delivery. You may possibly be given medicine to make you sleep (general anesthetic) though this is not as common.  An incision will be made in your abdomen that extends to your uterus. There are 2 basic kinds of incisions:  The horizontal (transverse) incision. Horizontal incisions are from side to side and are used for most routine cesarean  deliveries.  The vertical incision. The vertical incision is from the top of the abdomen to the bottom and is less commonly used. It is often done for women who have a serious complication (extreme prematurity) or under emergency situations.  The horizontal and vertical incisions may both be used at the same time. However, this is very uncommon.  An incision is then made in your uterus to deliver the baby.  Your baby will then be delivered.  Both incisions are then closed with absorbable stitches. AFTER THE PROCEDURE   If you were awake during the surgery, you will see your baby right away. If you were asleep, you will see your baby as soon as you are awake.  You may breastfeed your baby after surgery.  You may be able to get up and walk the same day as the surgery. If you need to stay in bed for a period of time, you will receive help to turn, cough, and take deep breaths after surgery. This helps prevent lung problems such as pneumonia.  Do not get out of bed alone the first time after surgery. You will need help getting out of bed until you are able to do this by yourself.  You may be able to shower the day after your cesarean delivery. After the bandage (dressing) is taken off the incision site, a nurse will assist you to shower if you would like help.  You will have pneumatic compression hose placed on your lower legs. This is done to prevent blood clots. When you are up   and walking regularly, they will no longer be necessary.  Do not cross your legs when you sit.  Save any blood clots that you pass. If you pass a clot while on the toilet, do not flush it. Call for the nurse. Tell the nurse if you think you are bleeding too much or passing too many clots.  You will be given medicine as needed. Let your health care providers know if you are hurting. You may also be given an antibiotic to prevent an infection.  Your IV tube will be taken out when you are drinking a reasonable  amount of fluids. The Foley catheter is taken out when you are up and walking.  If your blood type is Rh negative and your baby's blood type is Rh positive, you will be given a shot of anti-D immune globulin. This shot prevents you from having Rh problems with a future pregnancy. You should get the shot even if you had your tubes tied (tubal ligation).  If you are allowed to take the baby for a walk, place the baby in the bassinet and push it. Do not carry your baby in your arms. Document Released: 09/19/2005 Document Revised: 07/10/2013 Document Reviewed: 04/10/2013 Exit[employer] Patient Information 2015 [employer]. This information is not intended to replace advice given to you by your health care provider. Make sure you discuss any questions you have with your health care provider.  

## 2014-12-24 NOTE — Discharge Summary (Signed)
Obstetric Discharge Summary Reason for Admission: cesarean section Prenatal Procedures: Ultrasound Intrapartum Procedures: cesarean: low cervical, transverse Postpartum Procedures: none Complications-Operative and Postpartum: none HEMOGLOBIN  Date Value Ref Range Status  12/23/2014 9.6* 12.0 - 15.0 g/dL Final   HCT  Date Value Ref Range Status  12/23/2014 29.5* 36.0 - 46.0 % Final    Physical Exam:  General: alert, cooperative and appears stated age Lochia: appropriate Uterine Fundus: firm Incision: healing well, no significant drainage DVT Evaluation: No evidence of DVT seen on physical exam. Negative Homan's sign.  Discharge Diagnoses: Term Pregnancy-delivered  Discharge Information: Date: 12/24/2014 Activity: pelvic rest Diet: routine Medications: Ibuprofen, Colace and Iron Condition: stable Instructions: refer to practice specific booklet Discharge to: home bcp for contraception  Newborn Data: Live born female  Birth Weight: 7 lb 3.9 oz (3285 g) APGAR: 9, 9 Home with mother.  Felix PaciniKuneff, Krista Godsil 12/24/2014, 7:38 AM

## 2014-12-24 NOTE — Lactation Note (Signed)
This note was copied from the chart of Vanessa Floria Ravelingajat Urquilla. Lactation Consultation Note; [employer] interpreter on phone with Arabic language for all teaching. Mother states infant is breastfeeding well. She states that she has slight tenderness on the Rt nipple when infant latched. Observed that mothers breast are filling and tissue is intact. Mother was given comfort gels. Reviewed Baby and me book on treatment plan for severe engorgement. Advised mother to breastfeed infant 8-12 times in 24 hours as well as with all feeding cues. Father at the bedside for teaching. Mother declines a need for a hand pump. Mother informed of available LC services. Parents receptive to all teaching.   Patient Name: [name]'s Date: 12/24/2014 Reason for consult: Follow-up assessment   Maternal Data    Feeding Feeding Type: Formula  LATCH Score/Interventions                      Lactation Tools Discussed/Used     Consult Status Consult Status: Complete    Michel BickersKendrick, Fain Francis McCoy 12/24/2014, 11:18 AM

## 2015-01-02 IMAGING — US US OB COMP +14 WK
1 series · 12 of 28 positions shown · non-contrast
Comparison: none

[Series 1: us ob comp +14 wk · 0.21mm/px · 12 of 95 slices shown]
[im 4/95]
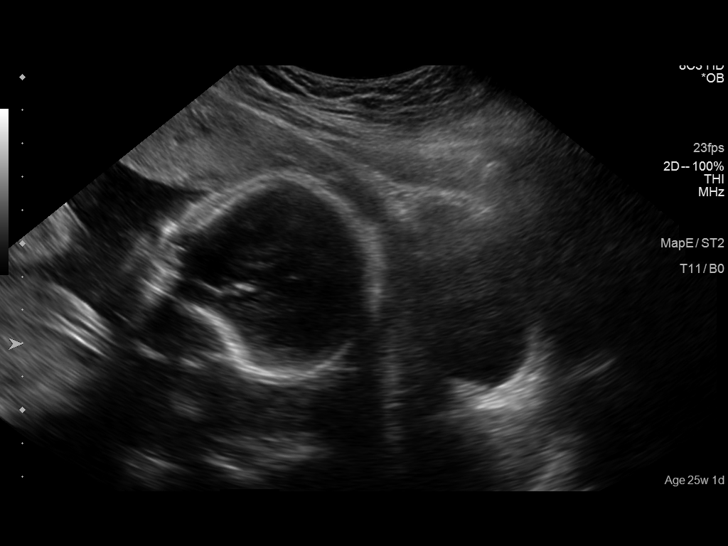
[im 11/95]
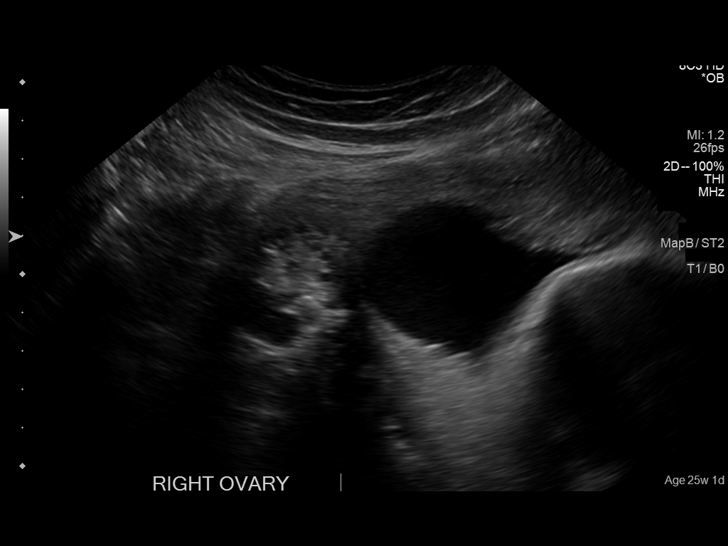
[im 18/95]
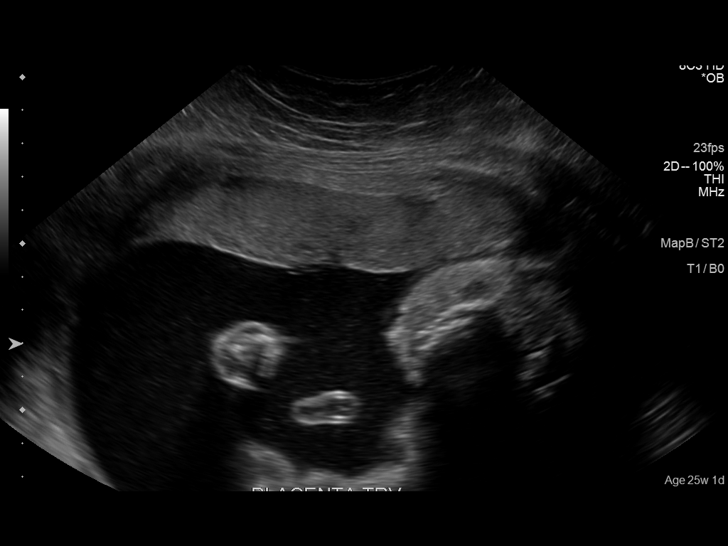
[im 28/95]
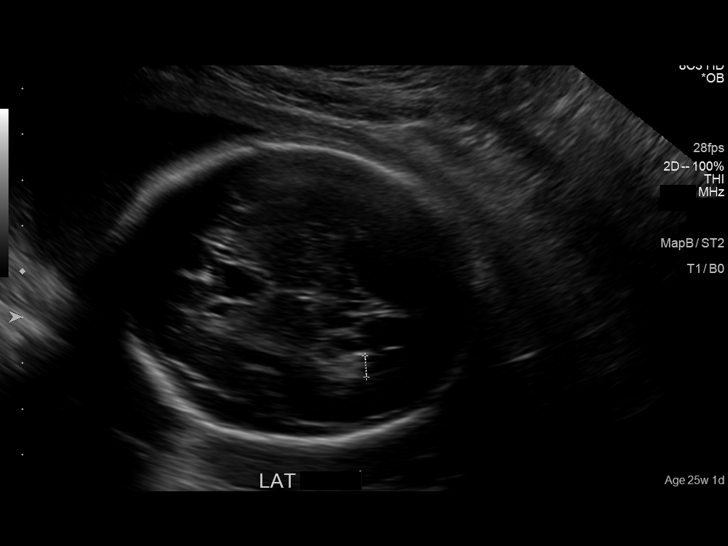
[im 35/95]
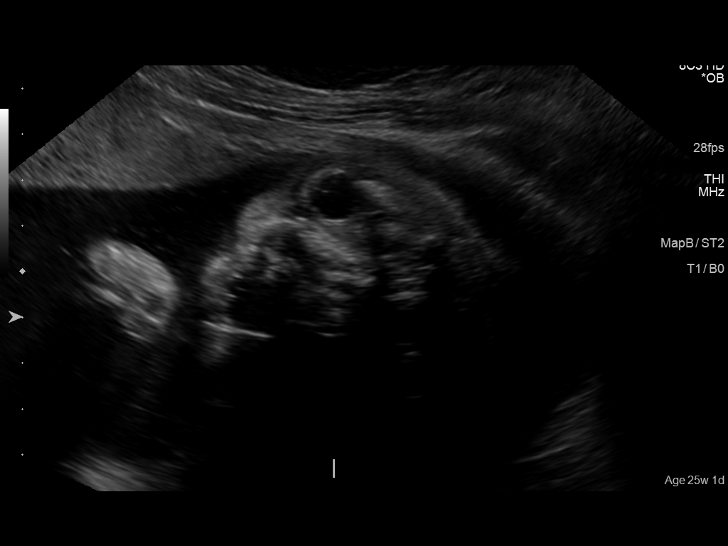
[im 42/95]
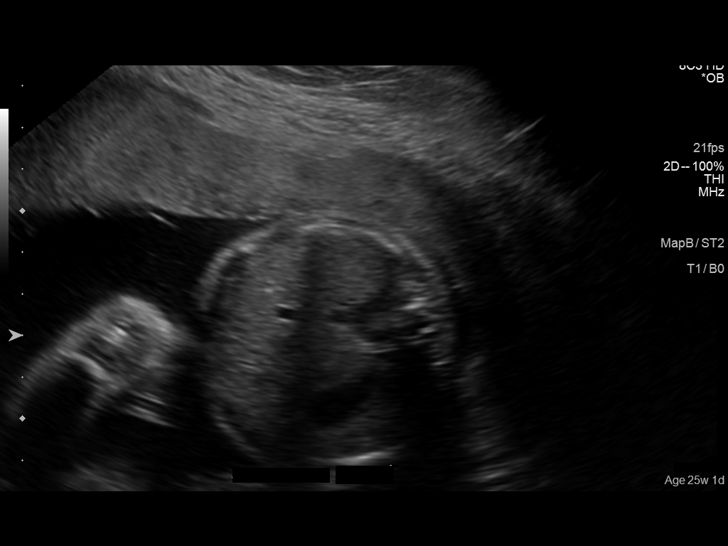
[im 53/95]
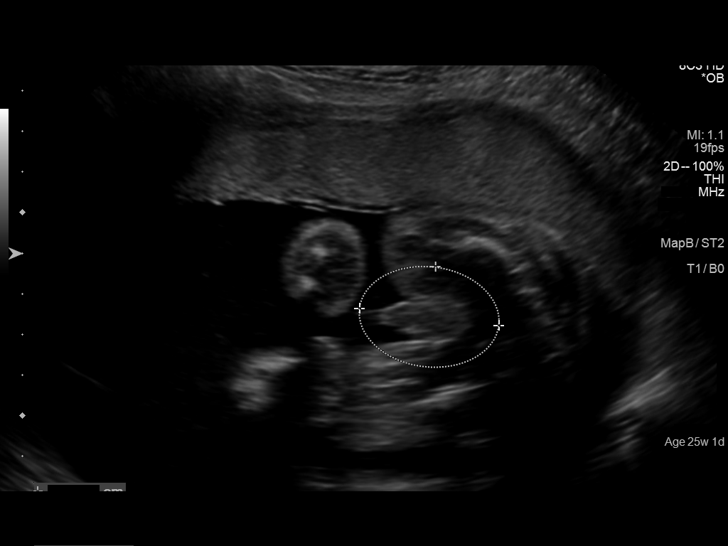
[im 60/95]
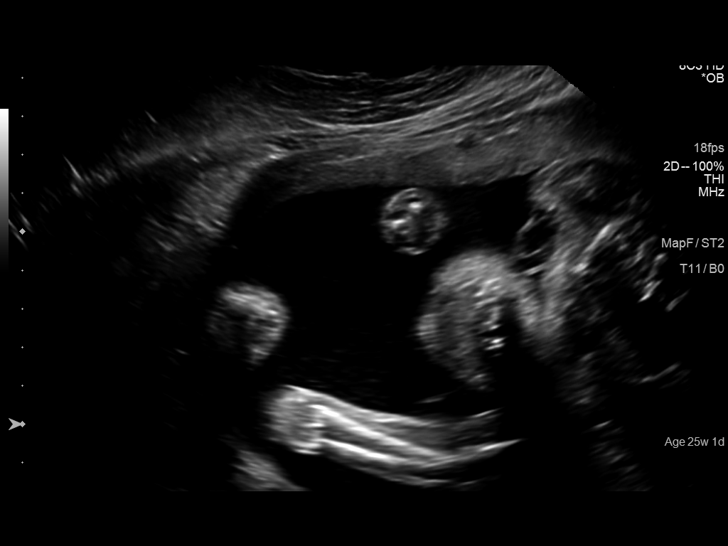
[im 67/95]
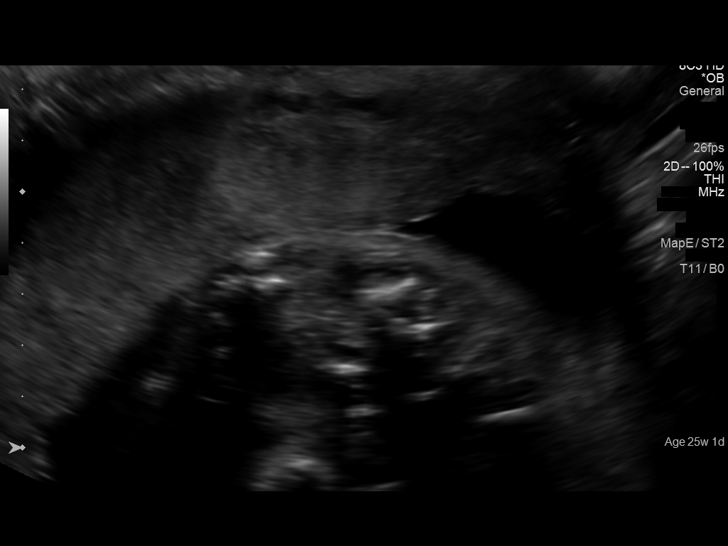
[im 77/95]
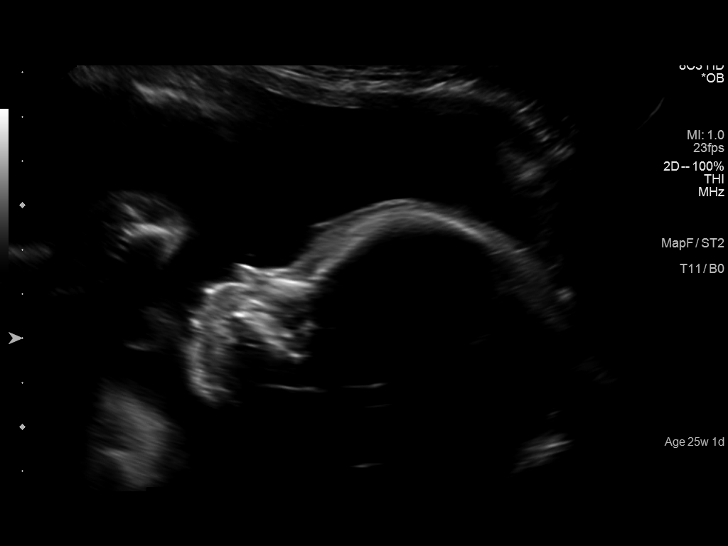
[im 84/95]
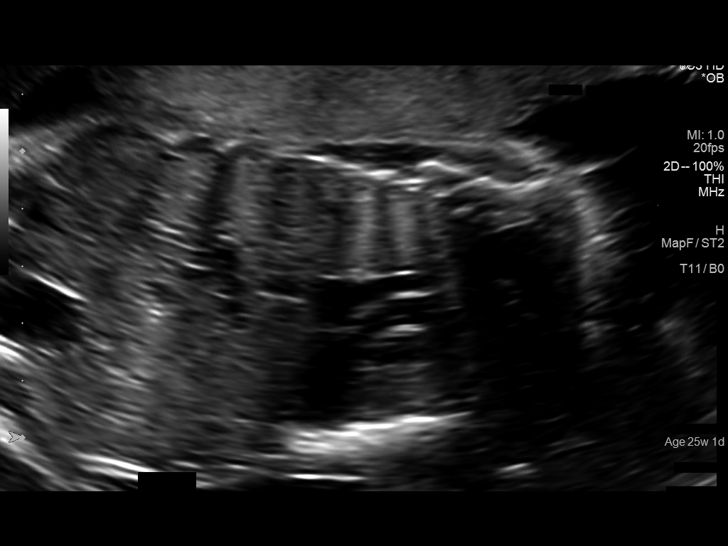
[im 91/95]
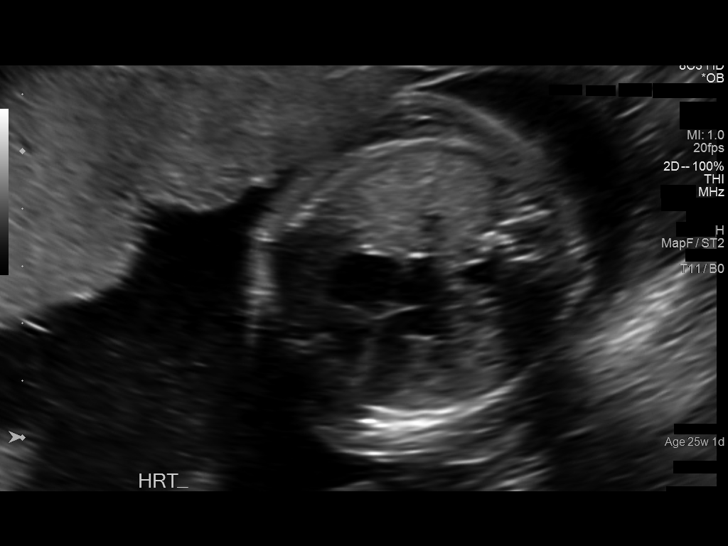

[12 of 28 positions shown; findings below may reference images not displayed]

OBSTETRICS REPORT
                      (Signed Final 09/15/2014 [DATE])

Service(s) Provided

 US OB COMP + 14 WK                                    76805.1
Indications

 25 weeks gestation of pregnancy
 No or Little Prenatal Care
 Basic anatomic survey                                 z36
 Previous cesarean section
 Poor obstetric history: Previous gestational
 diabetes
Fetal Evaluation

 Num Of Fetuses:    1
 Fetal Heart Rate:  150                          bpm
 Cardiac Activity:  Observed
 Presentation:      Cephalic
 Placenta:          Anterior, above cervical os
 P. Cord            Visualized
 Insertion:

 Amniotic Fluid
 AFI FV:      Subjectively upper-normal
                                              Larg Pckt:    6.8  cm
Biometry

 BPD:     64.3   mm    G. Age:  26w 0d                CI:        75.42    70 - 86
                                                      FL/HC:       20.6   18.7 -

 HC:     234.8   mm    G. Age:  25w 3d       42   %   HC/AC:       1.13   1.04 -

 AC:     207.1   mm    G. Age:  25w 2d       46   %   FL/BPD:      75.3   71 - 87
 FL:      48.4   mm    G. Age:  26w 2d       70   %   FL/AC:       23.4   20 - 24

 Est. FW:     840   gm    1 lb 14 oz     64  %
Gestational Age

 LMP:           24w 0d        Date:  03/31/14                 EDD:    01/05/15
 U/S Today:     25w 5d                                        EDD:    12/24/14
 Best:          25w 1d     Det. By:  Early Ultrasound         EDD:    12/28/14
Anatomy
 Cranium:          Appears normal         Aortic Arch:       Not well visualized
 Fetal Cavum:      Appears normal         Ductal Arch:       Not well visualized
 Ventricles:       Appears normal         Diaphragm:         Appears normal
 Choroid Plexus:   Appears normal         Stomach:           Appears normal, left
                                                             sided
 Cerebellum:       Appears normal         Abdomen:           Appears normal
 Posterior Fossa:  Appears normal         Abdominal Wall:    Appears nml (cord
                                                             insert, abd wall)
 Nuchal Fold:      Not applicable (>20    Cord Vessels:      Appears normal (3
                   wks GA)                                   vessel cord)
 Face:             Appears normal         Kidneys:           Appear normal
                   (orbits and profile)
 Lips:             Appears normal         Bladder:           Appears normal
 Heart:            Not well visualized    Spine:             Not well visualized
 RVOT:             Not well visualized    Lower              Appears normal
                                          Extremities:
 LVOT:             Not well visualized    Upper              Appears normal
                                          Extremities:

 Other:  Male gender. Heels and 5th digit visualized. Technically difficult due
         to fetal position.
Cervix Uterus Adnexa

 Cervical Length:    5.2       cm

 Cervix:       Normal appearance by transabdominal scan.

 Left Ovary:    Within normal limits.
 Right Ovary:   Within normal limits.
 Adnexa:     No abnormality visualized.
Impression

 SIUP at 25+1 weeks
 Normal detailed fetal anatomy; limited views of heart and
 spine
 Normal amniotic fluid volume
 Measurements consistent with prior early US; EFW at the
 64th %tile
Recommendations

 Follow-up ultrasound in 6-8 weeks to complete anatomy
 survey

 questions or concerns.

## 2015-01-28 ENCOUNTER — Ambulatory Visit: Payer: Medicaid Other | Admitting: Family Medicine

## 2015-01-28 ENCOUNTER — Encounter: Payer: Medicaid Other | Admitting: Family Medicine

## 2015-01-28 ENCOUNTER — Encounter: Payer: Self-pay | Admitting: Family Medicine

## 2015-01-28 ENCOUNTER — Ambulatory Visit (INDEPENDENT_AMBULATORY_CARE_PROVIDER_SITE_OTHER): Payer: Medicaid Other | Admitting: Family Medicine

## 2015-01-28 MED ORDER — PENICILLIN V POTASSIUM 500 MG PO TABS: 500.0000 mg | ORAL_TABLET | Freq: Four times a day (QID) | ORAL | Status: DC

## 2015-01-28 MED ORDER — HYDROCODONE-ACETAMINOPHEN 5-325 MG PO TABS: 1.0000 | ORAL_TABLET | Freq: Four times a day (QID) | ORAL | Status: DC | PRN

## 2015-01-28 NOTE — Progress Notes (Deleted)
  Subjective:     [name] is a 29 y.o. female who presents for a postpartum visit. She is 5 weeks postpartum following a low cervical transverse Cesarean section. I have fully reviewed the prenatal and intrapartum course. The delivery was at 39 gestational weeks. Outcome: repeat cesarean section, low transverse incision. Postpartum course has been unremarkable.  She does have a few complaints.  Baby's course has been unremarkable. Baby is feeding by breast. Bowel function is normal. Bladder function is normal. Contraception method is none. Postpartum depression screening: {neg default:13464::"negative"}.  Current complaints:  1) Finger numbness - Left digits (2nd, 3rd, and 4th digits). - Has been present since delivery. - No weakness.  2) Breast pain - Patient reports bilateral breast pain. - She endorses L breast pain particularly with breast feeding. She reports the L nipple is dry and cracked.  - She states there is associated redness. - No fever, chills.   Review of Systems Pertinent items are noted in HPI.   Objective:  There were no vitals filed for this visit. Vital signs reviewed.  Exam: General: well appearing, NAD. Cardiovascular: RRR. No murmurs, rubs, or gallops. Respiratory: CTAB. No rales, rhonchi, or wheeze. Abdomen: soft, nontender, nondistended. Breast:  Assessment:    5 week postpartum exam.  Plan:    1. Contraception: Planning on starting Micronor.   Follow up with PCP as indicated.

## 2015-01-28 NOTE — Patient Instructions (Signed)
It was nice to see you today.  You are doing well.  Be sure to breastfeed on demand. You can also pump to relieve some of the pain/pressure.  Start the birth control pill soon.  Follow up with Dr. Gwendolyn GrantWalden as indicated (at least annually).  Take care  Dr. Adriana Simasook

## 2015-01-28 NOTE — Progress Notes (Signed)
  Subjective:     [name] is a 29 y.o. female who presents for a postpartum visit. She is 5 weeks postpartum following a low cervical transverse Cesarean section. I have fully reviewed the prenatal and intrapartum course. The delivery was at 39 gestational weeks. Outcome: repeat cesarean section, low transverse incision.  Postpartum course has been unremarkable. Baby's course has been unremarkable. Baby is feeding by breast. Bleeding moderate lochia. Bowel function is normal. Bladder function is normal. Patient is not sexually active. Contraception method is none. Postpartum depression screening: negative.  Current complaints: 1) Numbness of digits of Left hand - Has been persistent since delivery. - Patient reports numbness of the index, middle, and ring fingers. - No associated weakness.  2) Breast pain/tenderness - Patient reports that she's recently been experiencing breast tenderness particularly the left breast. - She reports associated redness. - She states she has had some pain with breast-feeding. - She reports some irritation of nipple.  - Denies recent fevers, chills nausea, vomiting.,   Review of Systems A comprehensive review of systems was negative.  Other than what is stated in HPI.  Objective:    BP 114/68 mmHg  Pulse 98  Temp(Src) 98.1 F (36.7 C) (Oral)  Ht 5\' 3"  (1.6 m)  Wt 163 lb (73.936 kg)  BMI 28.88 kg/m2  General:   well-appearing female in no acute distress.    Breasts:   Inspection normal. Patient does have milk flowing from the left breast. No nipple discharge, cracking, redness. Left breast with mild tenderness. Mild engorgement of the ducts noted.   Lungs: clear to auscultation bilaterally  Heart:  regular rate and rhythm, S1, S2 normal, no murmur, click, rub or gallop  Abdomen: soft, non-tender; bowel sounds normal; no masses,  no organomegaly; well-healed C-section scar.     MSK Left hand: inspection normal. + Reverse phalens.          Assessment:   5 week postpartum exam. L hand numbness, Likely CTS Breast pain  Plan:    1. Contraception: Micronor.   2. Numbness likely secondary to carpal tunnel syndrome. This is likely to resolve later in the postpartum course. Reassurance provided.  3. Breast pain - No signs of mastitis. - Breast pain likely secondary to blocked duct/engorgement. Advised frequent breast-feeding, massage and pumping.

## 2015-01-29 NOTE — Progress Notes (Signed)
I was preceptor the day of this visit.   

## 2015-01-29 NOTE — Progress Notes (Signed)
Encounter cancelled as this was a wrong encounter type.

## 2015-12-08 ENCOUNTER — Ambulatory Visit (INDEPENDENT_AMBULATORY_CARE_PROVIDER_SITE_OTHER): Payer: BLUE CROSS/BLUE SHIELD | Admitting: Family Medicine

## 2015-12-08 ENCOUNTER — Encounter: Payer: Self-pay | Admitting: Family Medicine

## 2015-12-08 VITALS — BP 118/82 | HR 94 | Temp 98.1°F | Ht 63.0 in | Wt 180.9 lb

## 2015-12-08 DIAGNOSIS — R600 Localized edema: Secondary | ICD-10-CM | POA: Diagnosis not present

## 2015-12-08 DIAGNOSIS — Z309 Encounter for contraceptive management, unspecified: Secondary | ICD-10-CM | POA: Diagnosis not present

## 2015-12-08 DIAGNOSIS — J302 Other seasonal allergic rhinitis: Secondary | ICD-10-CM | POA: Diagnosis not present

## 2015-12-08 DIAGNOSIS — D649 Anemia, unspecified: Secondary | ICD-10-CM | POA: Diagnosis not present

## 2015-12-08 DIAGNOSIS — IMO0001 Reserved for inherently not codable concepts without codable children: Secondary | ICD-10-CM | POA: Insufficient documentation

## 2015-12-08 LAB — COMPREHENSIVE METABOLIC PANEL
ALT: 28 U/L (ref 6–29)
AST: 21 U/L (ref 10–30)
Albumin: 4 g/dL (ref 3.6–5.1)
Alkaline Phosphatase: 86 U/L (ref 33–115)
BUN: 9 mg/dL (ref 7–25)
CO2: 22 mmol/L (ref 20–31)
Calcium: 9.5 mg/dL (ref 8.6–10.2)
Chloride: 104 mmol/L (ref 98–110)
Creat: 0.56 mg/dL (ref 0.50–1.10)
Glucose, Bld: 110 mg/dL — ABNORMAL HIGH (ref 65–99)
Potassium: 4.3 mmol/L (ref 3.5–5.3)
Sodium: 136 mmol/L (ref 135–146)
Total Bilirubin: 0.2 mg/dL (ref 0.2–1.2)
Total Protein: 7.3 g/dL (ref 6.1–8.1)

## 2015-12-08 LAB — CBC WITH DIFFERENTIAL/PLATELET
Basophils Absolute: 0 10*3/uL (ref 0.0–0.1)
Basophils Relative: 0 % (ref 0–1)
Eosinophils Absolute: 0.1 10*3/uL (ref 0.0–0.7)
Eosinophils Relative: 2 % (ref 0–5)
HCT: 36.7 % (ref 36.0–46.0)
Hemoglobin: 11.6 g/dL — ABNORMAL LOW (ref 12.0–15.0)
Lymphocytes Relative: 39 % (ref 12–46)
Lymphs Abs: 2.4 10*3/uL (ref 0.7–4.0)
MCH: 23 pg — ABNORMAL LOW (ref 26.0–34.0)
MCHC: 31.6 g/dL (ref 30.0–36.0)
MCV: 72.7 fL — ABNORMAL LOW (ref 78.0–100.0)
MPV: 9.8 fL (ref 8.6–12.4)
Monocytes Absolute: 0.4 10*3/uL (ref 0.1–1.0)
Monocytes Relative: 6 % (ref 3–12)
Neutro Abs: 3.3 10*3/uL (ref 1.7–7.7)
Neutrophils Relative %: 53 % (ref 43–77)
Platelets: 382 10*3/uL (ref 150–400)
RBC: 5.05 MIL/uL (ref 3.87–5.11)
RDW: 16 % — ABNORMAL HIGH (ref 11.5–15.5)
WBC: 6.2 10*3/uL (ref 4.0–10.5)

## 2015-12-08 LAB — TSH: TSH: 1.66 mIU/L

## 2015-12-08 MED ORDER — CETIRIZINE HCL 10 MG PO TABS: 10.0000 mg | ORAL_TABLET | Freq: Every day | ORAL | Status: DC

## 2015-12-08 MED ORDER — NORGESTIMATE-ETH ESTRADIOL 0.25-35 MG-MCG PO TABS: 1.0000 | ORAL_TABLET | Freq: Every day | ORAL | Status: DC

## 2015-12-08 NOTE — Patient Instructions (Addendum)
It was good to see you today.  We are checking your blood tests to see if this is causing your swelling.    I have prescribed a birth control pill.  Take this everyday.  I have prescribed an allergy pill, take this everyday during the spring.   We'll schedule you for your pap smear.  It was good to see you today!  Come back to see me in about 4 - 6 months to make sure you're doing okay.

## 2015-12-08 NOTE — Progress Notes (Signed)
Subjective:   Arabic nterpreter [phone] #[id number] used today.  [name] is a 30 y.o. female [facility] edema:  1.  Edema: Present in  Feet, arms, and face.  Worse at the end of the day, better in the AM.  Actually feels this is better during her menstrual cycle.  Her ring is tighter at the end of the day as well.  Has not tried anything for relief. Denies adding salt to her food.  Not currently taking any medicines.    Weight has been increasing for the past several weeks.    #2. Contraception: She was previously prescribed norethindrone. She had increased bleeding with birth control that she was taking.  She therefore hasn't taken this in at least a year. LMP is now normal since she has been off the birth control.  LMP started a few days ago, which is on schedule.  No vaginal/intra-menstrual bleeding. She would like to be switched her birth control pill which does not contribute to intermenstrual bleeding.   ROS as above per HPI, otherwise neg.  Pertinently, no chest pain, palpitations, SOB, Fever, Chills, Abd pain, N/V/D.   The following portions of the patient's history were reviewed and updated as appropriate: allergies, current medications, past medical history, family and social history, and problem list. Patient is a nonsmoker.    PMH reviewed.  Past Medical History  Diagnosis Date  . Anemia    Past Surgical History  Procedure Laterality Date  . Cesarean section      x 2 both in [place]  . Cesarean section N/A 12/22/2014    Procedure: CESAREAN SECTION;  Surgeon: Tereso NewcomerUgonna A Anyanwu, MD;  Location: [facility];  Service: Obstetrics;  Laterality: N/A;       Objective:   Physical Exam BP 118/82 mmHg  Pulse 94  Temp(Src) 98.1 F (36.7 C) (Oral)  Ht 5\' 3"  (1.6 m)  Wt 180 lb 14.4 oz (82.056 kg)  BMI 32.05 kg/m2  LMP 12/06/2015 Gen:  Alert, cooperative patient who appears stated age in no acute distress.  Vital signs reviewed. HEENT: EOMI,  MMM Cardiac:  Regular  rate and rhythm without murmur auscultated.  Good S1/S2. Pulm:  Clear to auscultation bilaterally with good air movement.  No wheezes or rales noted.   Exts: Trace edema bilateral ankles. Otherwise no edema on exam today. Hands also non-edematous.  No results found for this or any previous visit (from the past 72 hour(s)).

## 2015-12-08 NOTE — Assessment & Plan Note (Signed)
Relatively new onset. She was anemic during pregnancy. Has not been checked since she gave birth. We'll check TSH, electrolytes, kidney function, anemia today.

## 2015-12-08 NOTE — Assessment & Plan Note (Signed)
We'll switch patient to Sprintec. Follow-up for Pap smear in 4-6 weeks. Discussion about any intermenstrual bleeding at that visit.

## 2015-12-08 NOTE — Assessment & Plan Note (Signed)
Plan to treat with cetirizine.

## 2015-12-10 ENCOUNTER — Encounter: Payer: Self-pay | Admitting: Family Medicine

## 2016-01-07 ENCOUNTER — Ambulatory Visit (INDEPENDENT_AMBULATORY_CARE_PROVIDER_SITE_OTHER): Payer: BLUE CROSS/BLUE SHIELD | Admitting: Family Medicine

## 2016-01-07 ENCOUNTER — Other Ambulatory Visit (HOSPITAL_COMMUNITY)
Admission: RE | Admit: 2016-01-07 | Discharge: 2016-01-07 | Disposition: A | Discharge status: Home / Self Care | Payer: BLUE CROSS/BLUE SHIELD | Source: Ambulatory Visit | Attending: Family Medicine | Admitting: Family Medicine

## 2016-01-07 VITALS — BP 116/77 | HR 94 | Temp 98.5°F | Ht 63.0 in | Wt 176.9 lb

## 2016-01-07 DIAGNOSIS — Z01419 Encounter for gynecological examination (general) (routine) without abnormal findings: Secondary | ICD-10-CM | POA: Insufficient documentation

## 2016-01-07 DIAGNOSIS — Z1151 Encounter for screening for human papillomavirus (HPV): Secondary | ICD-10-CM | POA: Diagnosis present

## 2016-01-07 DIAGNOSIS — N90811 Female genital mutilation Type I status: Secondary | ICD-10-CM

## 2016-01-07 DIAGNOSIS — Z299 Encounter for prophylactic measures, unspecified: Secondary | ICD-10-CM | POA: Diagnosis not present

## 2016-01-07 DIAGNOSIS — Z124 Encounter for screening for malignant neoplasm of cervix: Secondary | ICD-10-CM

## 2016-01-07 DIAGNOSIS — N759 Disease of Bartholin's gland, unspecified: Secondary | ICD-10-CM

## 2016-01-07 DIAGNOSIS — Z3041 Encounter for surveillance of contraceptive pills: Secondary | ICD-10-CM

## 2016-01-07 HISTORY — DX: Female genital mutilation type I status: N90.811

## 2016-01-07 NOTE — Patient Instructions (Signed)
i will send you a note about your pap smear. The vaginal exam was normal. There is no wart or sign of infection or lesion. I suspect you are having some occasional intermittent swellingof a Bartholin's gland, That can occur intern=mittently. As long as you are not having pain we do not need to do anything about it.

## 2016-01-07 NOTE — Assessment & Plan Note (Signed)
Continue OCP

## 2016-01-07 NOTE — Progress Notes (Signed)
   Subjective:    Patient ID: [name], female    DOB: Dec 12, 1985, 30 y.o.   MRN: [id number]  HPI   Interpretor  CC: Needs Pap smear and wants evaluation of bump on left labia.   Has had no pain with the bump and it is sometimes smaller than other times.. No drainage. No pain with intercourse. Menses regular and she's currently on her menstrual cycle today. Back on the OCP that is working for her Reports no abnormal paps previously. [id number] s/p repeat LTCS, #3. March, 2016.   Review of Systems See history of present illness.    Objective:   Physical Exam Vital signs are reviewed GENERAL: Well-developed female no acute distress   GU: The area she points to for location of the bump is actually normal on my exam today. I can feel no mass. Notably this is at the location of the Bartholin's gland on the left labia. I see no abnormal vaginal discharge. She does have some small amount of blood coming from the os and in the vaginal vault. No adnexal masses or tenderness. The clitroral hood is resected  (type 1 clitoridectomy). Cervix appears normal. Pap is obtained.       Assessment & Plan:

## 2016-01-07 NOTE — Assessment & Plan Note (Signed)
Pa smear

## 2016-01-08 LAB — CYTOLOGY - PAP

## 2016-01-12 ENCOUNTER — Encounter: Payer: Self-pay | Admitting: Family Medicine

## 2016-05-20 ENCOUNTER — Ambulatory Visit (INDEPENDENT_AMBULATORY_CARE_PROVIDER_SITE_OTHER): Payer: BLUE CROSS/BLUE SHIELD | Admitting: Student

## 2016-05-20 ENCOUNTER — Encounter: Payer: Self-pay | Admitting: Student

## 2016-05-20 VITALS — BP 117/71 | HR 111 | Temp 98.7°F | Wt 172.2 lb

## 2016-05-20 DIAGNOSIS — B349 Viral infection, unspecified: Secondary | ICD-10-CM

## 2016-05-20 DIAGNOSIS — J988 Other specified respiratory disorders: Secondary | ICD-10-CM

## 2016-05-20 DIAGNOSIS — R059 Cough, unspecified: Secondary | ICD-10-CM

## 2016-05-20 DIAGNOSIS — B9789 Other viral agents as the cause of diseases classified elsewhere: Secondary | ICD-10-CM

## 2016-05-20 DIAGNOSIS — R05 Cough: Secondary | ICD-10-CM

## 2016-05-20 MED ORDER — AZITHROMYCIN 250 MG PO TABS: ORAL_TABLET | ORAL | 0 refills | Status: AC

## 2016-05-20 MED ORDER — DM-GUAIFENESIN ER 30-600 MG PO TB12: 1.0000 | ORAL_TABLET | Freq: Two times a day (BID) | ORAL | 0 refills | Status: DC

## 2016-05-20 NOTE — Assessment & Plan Note (Signed)
Signs and symptoms suggestive for viral respiratory tract infection. Her 32 years old daughter had similar symptoms before her. Her exam is significant for mild rhinorrhea, congestion or erythema of the turbinates bilaterally. Lung exam significant for some rhonchi but no crackles or wheeze or increased work of breathing. She is also tachycardic to 111 with delayed cap refills likely from poor by mouth intake. She denies nausea or vomiting except for mild pain when she swallows.   At this point, I cannot rule out superimposed bacterial respiratory tract infection given a protracted course of her viral illness. So I have sent a prescription for Z-Pak and Mucinex DM to her pharmacy. I have also recommended adequate hydration, steam inhalation and a tablespoonful of honey.   I have discussed return precautions including worsening of symptoms, shortness of breath, chest pain, fever or other symptoms concerning to her. Cough might take up to 5-6 weeks to resolve completely.

## 2016-05-20 NOTE — Patient Instructions (Addendum)
It was great seeing you today! We have addressed the following issues today  1. Cough: This is likely a viral infection. It may take up to 5-6 weeks to go away. Please keep yourself hydrated by drinking water or Gatorade. Steam inhalation is also helpful. You can also try a tablespoon of honey. I have also sent a prescriptions to your pharmacy. Please come back and see [name] if you have worsening of his symptoms, shortness of breath, chest pain, persistent fever or other symptoms very concerning to you.   If we did any lab work today, and the results require attention, either me or my nurse will get in touch with you. If everything is normal, you will get a letter in mail. If you don't hear from [name] in two weeks, please give [name] a call. Otherwise, I look forward to talking with you again at our next visit. If you have any questions or concerns before then, please call the clinic at [phone].  Please bring all your medications to every doctors visit   Sign up for My Chart to have easy access to your labs results, and communication with your Primary care physician.    Please check-out at the front desk before leaving the clinic.   Take Care,

## 2016-05-20 NOTE — Progress Notes (Signed)
   Subjective:    Patient ID: [name] is a 30 y.o. old female.  CC:   HPI #Cough, sore throat, fever and ear pain: these have been going on for 11 days. She is here today because her cough is worse today. She reports feeling warm but hasn't checked her temperature at home. Cough is productive with yellowish-greenish phlegm. Denies hemoptysis. She is not able to eat because of pain in her throat and poor appetite. Pain better with fluid. Her 32 years old Daughter with similar symptoms before her. She is getting better on her own.   Denies shortness of breath, chest pain, nausea, emesis or diarrhea.   Denies recent travel.  PMH: No history of asthma or COPD.   SH: Denies smoking  Review of Systems Per HPI Objective:   Vitals:   05/20/16 1056  BP: 117/71  Pulse: (!) 111  Temp: 98.7 F (37.1 C)  TempSrc: Oral  Weight: 172 lb 3.2 oz (78.1 kg)    GEN: appears well, no apparent distress. HEENT:   Eyes: without conjunctival injection, sclera anicteric,   Ears: Positive for effusion on the right, normal on the left. No periauricular swelling or skin color change. No pain with gentle tug on her ear  Nares: Positive for mild rhinorrhea, congestion or erythema bilaterally   Oropharynx: mmm without erythema or exudation HEM: No cervical lymphadenopathy CVS: Tachycardic, regular rhythm, normal s1 and s2, no murmurs, no edema, delayed cap to 5 secs.  RESP: no increased work of breathing, good air movement bilaterally, mild rhonchi bilaterally, no crackles or wheeze GI: soft, non-tender,non-distended, +BS NEURO: alert and oriented appropriately, no gross defecits  PSYCH: appropriate mood and affect     Assessment & Plan:  Viral respiratory infection Signs and symptoms suggestive for viral respiratory tract infection. Her 32 years old daughter had similar symptoms before her. Her exam is significant for mild rhinorrhea, congestion or erythema of the turbinates bilaterally. Lung exam  significant for some rhonchi but no crackles or wheeze or increased work of breathing. She is also tachycardic to 111 with delayed cap refills likely from poor by mouth intake. She denies nausea or vomiting except for mild pain when she swallows.   At this point, I cannot rule out superimposed bacterial respiratory tract infection given a protracted course of her viral illness. So I have sent a prescription for Z-Pak and Mucinex DM to her pharmacy. I have also recommended adequate hydration, steam inhalation and a tablespoonful of honey.   I have discussed return precautions including worsening of symptoms, shortness of breath, chest pain, fever or other symptoms concerning to her. Cough might take up to 5-6 weeks to resolve completely.

## 2017-01-26 ENCOUNTER — Ambulatory Visit (INDEPENDENT_AMBULATORY_CARE_PROVIDER_SITE_OTHER): Payer: BLUE CROSS/BLUE SHIELD | Admitting: Family Medicine

## 2017-01-26 ENCOUNTER — Encounter: Payer: Self-pay | Admitting: Family Medicine

## 2017-01-26 ENCOUNTER — Other Ambulatory Visit: Payer: Self-pay | Admitting: Internal Medicine

## 2017-01-26 ENCOUNTER — Encounter: Payer: Self-pay | Admitting: Obstetrics and Gynecology

## 2017-01-26 ENCOUNTER — Inpatient Hospital Stay (HOSPITAL_COMMUNITY)
Admission: AD | Admit: 2017-01-26 | Discharge: 2017-02-02 | DRG: 419 | Disposition: A | Discharge status: Home / Self Care | Payer: BLUE CROSS/BLUE SHIELD | Source: Ambulatory Visit | Attending: Family Medicine | Admitting: Family Medicine

## 2017-01-26 DIAGNOSIS — K8010 Calculus of gallbladder with chronic cholecystitis without obstruction: Principal | ICD-10-CM | POA: Diagnosis present

## 2017-01-26 DIAGNOSIS — R1013 Epigastric pain: Secondary | ICD-10-CM

## 2017-01-26 DIAGNOSIS — Z8249 Family history of ischemic heart disease and other diseases of the circulatory system: Secondary | ICD-10-CM

## 2017-01-26 DIAGNOSIS — K8050 Calculus of bile duct without cholangitis or cholecystitis without obstruction: Secondary | ICD-10-CM | POA: Diagnosis not present

## 2017-01-26 DIAGNOSIS — Z419 Encounter for procedure for purposes other than remedying health state, unspecified: Secondary | ICD-10-CM

## 2017-01-26 DIAGNOSIS — D509 Iron deficiency anemia, unspecified: Secondary | ICD-10-CM | POA: Diagnosis present

## 2017-01-26 DIAGNOSIS — R112 Nausea with vomiting, unspecified: Secondary | ICD-10-CM

## 2017-01-26 DIAGNOSIS — R7989 Other specified abnormal findings of blood chemistry: Secondary | ICD-10-CM | POA: Diagnosis not present

## 2017-01-26 DIAGNOSIS — R109 Unspecified abdominal pain: Secondary | ICD-10-CM | POA: Insufficient documentation

## 2017-01-26 DIAGNOSIS — K8020 Calculus of gallbladder without cholecystitis without obstruction: Secondary | ICD-10-CM | POA: Diagnosis not present

## 2017-01-26 LAB — CBC WITH DIFFERENTIAL/PLATELET
Basophils Absolute: 0 10*3/uL (ref 0.0–0.1)
Basophils Relative: 0 %
Eosinophils Absolute: 0.1 10*3/uL (ref 0.0–0.7)
Eosinophils Relative: 1 %
HCT: 31.2 % — ABNORMAL LOW (ref 36.0–46.0)
Hemoglobin: 9.6 g/dL — ABNORMAL LOW (ref 12.0–15.0)
Lymphocytes Relative: 31 %
Lymphs Abs: 1.5 10*3/uL (ref 0.7–4.0)
MCH: 19.7 pg — ABNORMAL LOW (ref 26.0–34.0)
MCHC: 30.8 g/dL (ref 30.0–36.0)
MCV: 64.1 fL — ABNORMAL LOW (ref 78.0–100.0)
Monocytes Absolute: 0.2 10*3/uL (ref 0.1–1.0)
Monocytes Relative: 5 %
Neutro Abs: 3.1 10*3/uL (ref 1.7–7.7)
Neutrophils Relative %: 63 %
Platelets: 371 10*3/uL (ref 150–400)
RBC: 4.87 MIL/uL (ref 3.87–5.11)
RDW: 17.5 % — ABNORMAL HIGH (ref 11.5–15.5)
WBC: 4.9 10*3/uL (ref 4.0–10.5)

## 2017-01-26 LAB — COMPREHENSIVE METABOLIC PANEL
ALT: 392 U/L — ABNORMAL HIGH (ref 14–54)
AST: 349 U/L — ABNORMAL HIGH (ref 15–41)
Albumin: 3.7 g/dL (ref 3.5–5.0)
Alkaline Phosphatase: 139 U/L — ABNORMAL HIGH (ref 38–126)
Anion gap: 10 (ref 5–15)
BUN: 10 mg/dL (ref 6–20)
CO2: 23 mmol/L (ref 22–32)
Calcium: 8.8 mg/dL — ABNORMAL LOW (ref 8.9–10.3)
Chloride: 107 mmol/L (ref 101–111)
Creatinine, Ser: 0.6 mg/dL (ref 0.44–1.00)
GFR calc Af Amer: >60 mL/min (ref >60)
GFR calc non Af Amer: >60 mL/min (ref >60)
Glucose, Bld: 96 mg/dL (ref 65–99)
Potassium: 3.8 mmol/L (ref 3.5–5.1)
Sodium: 140 mmol/L (ref 135–145)
Total Bilirubin: 0.7 mg/dL (ref 0.3–1.2)
Total Protein: 7.8 g/dL (ref 6.5–8.1)

## 2017-01-26 LAB — URINALYSIS, ROUTINE W REFLEX MICROSCOPIC
Bilirubin Urine: NEGATIVE
Glucose, UA: NEGATIVE mg/dL
Ketones, ur: 20 mg/dL — AB
Leukocytes, UA: NEGATIVE
Nitrite: NEGATIVE
Protein, ur: 100 mg/dL — AB
Specific Gravity, Urine: 1.027 (ref 1.005–1.030)
pH: 6 (ref 5.0–8.0)

## 2017-01-26 LAB — MAGNESIUM: Magnesium: 2.1 mg/dL (ref 1.7–2.4)

## 2017-01-26 LAB — RETICULOCYTES
RBC.: 4.6 MIL/uL (ref 3.87–5.11)
Retic Count, Absolute: 55.2 10*3/uL (ref 19.0–186.0)
Retic Ct Pct: 1.2 % (ref 0.4–3.1)

## 2017-01-26 LAB — FERRITIN: Ferritin: 11 ng/mL (ref 11–307)

## 2017-01-26 LAB — VITAMIN B12: Vitamin B-12: 727 pg/mL (ref 180–914)

## 2017-01-26 LAB — PHOSPHORUS: Phosphorus: 3.4 mg/dL (ref 2.5–4.6)

## 2017-01-26 LAB — IRON AND TIBC
Iron: 21 ug/dL — ABNORMAL LOW (ref 28–170)
Saturation Ratios: 5 % — ABNORMAL LOW (ref 10.4–31.8)
TIBC: 456 ug/dL — ABNORMAL HIGH (ref 250–450)
UIBC: 435 ug/dL

## 2017-01-26 LAB — LIPASE, BLOOD: Lipase: 27 U/L (ref 11–51)

## 2017-01-26 MED ORDER — ONDANSETRON HCL 4 MG/2ML IJ SOLN
4.0000 mg | Freq: Four times a day (QID) | INTRAMUSCULAR | Status: DC | PRN
  Administered 2017-01-28: 4 mg via INTRAVENOUS
  Filled 2017-01-26: qty 2

## 2017-01-26 MED ORDER — SODIUM CHLORIDE 0.9 % IV SOLN
INTRAVENOUS | Status: AC
  Administered 2017-01-26: 17:00:00 via INTRAVENOUS

## 2017-01-26 MED ORDER — PANTOPRAZOLE SODIUM 40 MG IV SOLR
40.0000 mg | Freq: Every day | INTRAVENOUS | Status: DC
  Administered 2017-01-26 – 2017-01-27 (×2): 40 mg via INTRAVENOUS
  Filled 2017-01-26 (×2): qty 40

## 2017-01-26 MED ORDER — ONDANSETRON HCL 4 MG PO TABS: 4.0000 mg | ORAL_TABLET | Freq: Four times a day (QID) | ORAL | Status: DC | PRN

## 2017-01-26 NOTE — Progress Notes (Signed)
   Subjective:   [name] is a 31 y.o. female with a history of 3 previous C-sections here for same day appointment for  Chief Complaint  Patient presents with  . Emesis     Patient reports Epigastric abdominal pain that is constant and vomiting every time she eats for the last 3 days area and she's had mild episodes of this previously, but nothing so severe as this. She reports she has not been able to keep any food or liquid down for 3 days.  The pain is constant, but acutely worsens to 10 out of 10 after eating. It only resolves after she vomits after eating. She tried taking ibuprofen which has previously help for this pain, but did not help. Her last meal was 3 PM yesterday, but she still has vomited this morning. She denies any bilious material or blood in her vomit. She did have a hard stool 2 days ago but no bowel movement since area no blood in her stool, melena, chest pain, shortness of breath, diarrhea, dizziness, lightheadedness, headache. A few times when she was having pain and vomiting she did become diaphoretic.  Review of Systems:  Per HPI.   Social History: never smoker  Objective:  BP 110/84   Pulse 82   Temp 97.8 F (36.6 C) (Oral)   Ht  (1.6 m)   Wt 175 lb 3.2 oz (79.5 kg)   LMP 12/21/2016 (Exact Date)   SpO2 99%   BMI 31.04 kg/m   Gen:  31 y.o. female in NAD HEENT: NCAT, Slightly dry, anicteric sclerae CV: RRR, no MRG Resp: Non-labored, CTAB, no wheezes noted Abd: Soft, ND, BS decreased, no guarding or organomegaly, TTP in RLQ, none in epigastrium, negative Murphys sign Ext: WWP, no edema MSK: Moves all extremities, no obvious deformity Neuro: Alert and oriented, speech normal      Assessment & Plan:     [name] is a 31 y.o. female here for   Abdominal pain Vital signs stable currently Patient without tenderness on exam, but reporting significant increase in pain after eating Unable to keep any food or liquids down for 3 days and continues  to vomit despite not eating this morning Some concern for bowel structure and and does have risk factor given 3 previous C-sections Could have gastritis or gastric ulcer though without hematemesis it does not seem it is bleeding currently It is possible that she has undiagnosed gastroparesis, but she does not have risk factors for this We will admit patient to the hospital for IV fluid resuscitation as well as CT abdomen and pelvis to evaluate for bowel obstruction May also need GI consult for EGD to evaluate for PUD  Nausea with vomiting See plan for abdominal pain above   Erasmo Downer, MD MPH PGY-3,  [facility] 01/26/2017  11:56 AM

## 2017-01-26 NOTE — Progress Notes (Signed)
FPTS Interim Progress Note  S: Late Entry:  Went to evaluate patient after she came to the hospital. Currently she is doing okay and is without pain. No nausea currently.   O: BP 118/71 (BP Location: Left Arm)   Pulse 81   Temp 98 F (36.7 C) (Oral)   Resp 17   Ht  (1.6 m)   Wt 80.1 kg (176 lb 9.6 oz)   LMP 01/26/2017   SpO2 100%   BMI 31.28 kg/m   GEN: NAD CV: RRR, no murmurs, rubs, or gallops PULM: CTAB, normal effort ABD: Soft, TTP in the upper abdomen including epigastric region without guarding or rebound, nondistended, NABS, no organomegaly SKIN: No rash or cyanosis; warm and well-perfused EXTR: No lower extremity edema or calf tenderness PSYCH: Mood and affect euthymic, normal rate and volume of speech NEURO: Awake, alert, no focal deficits grossly, normal speech   A/P: Epigastric Pain: Lipase is normal but does have elevated LFTs with normal bilirubin. - CT abdomen ordered on admission  - will wait for results to call GI as patient is stable   Microcytic Anemia: hgb 9.6 from 11.6 in 12/2015. No signs of bleeding but we are concerned that her symptoms may be caused by PUD. Reports she is currently on her period and is having normal flow. Vitals are stable.  - FOBT - anemia panel - will start PPI  - on SCDs for now  Transaminitis: noted on admission labs - obtaining CT abdomen/pelvis - will order hepatitis as well  Palma Holter, MD 01/26/2017, 8:36 PM PGY-2, [facility] Service pager [phone]

## 2017-01-26 NOTE — H&P (Signed)
Family Medicine Teaching [facility] Admission History and Physical Service Pager: [phone]  Patient name: [name] Medical record number: [id number] Date of birth: 11/06/1985 Age: 31 y.o. Gender: female  Primary Care Provider: Renold Don, MD Consultants: None Code Status: Full  Chief Complaint: Epigastric Pain  Assessment and Plan: [name] is a 31 y.o. female presenting with stomach pain . No significant PMH.  #Epigrastric Pain with vomiting: Unknown etiology at this time. Differential includes PUD, gastritis, bowel obstruction, pancreatitis, pregnancy (not on birth control any longer). Patient and vitals are stable. No red flags. She has had 3 prior C-sections but no other abdominal surgeries but this increases her risk for obstruction. With NSAID use increases risk for PUD and gastritis.  -admit to med-surg under Dr. Jennette Kettle -labs: CMP, CBC, Upreg, UA, lipase - IV fluid resuscitation -NPO  -CT abdomen and pelvis -Will consult GI for EGD to evaluate for PUD -zofran prn for nausea/vomiting  FEN/GI: NPO Prophylaxis: SCDs, risk of bleeding ulcer so hold off on anticoagulation  Disposition: Admit to med-surg   History of Present Illness:  [name] is a 31 y.o. female presenting with epigastric pain. She has no significant PMH. Patient reports epigastric abdominal pain that is constant and vomiting every time she eats for the last 3 days. Describes the pain as severe, intermittent, and dull. Nothing makes pain worse but she does get relief from vomiting. She has had episodes of this abdominal pain in the past with most recent episode 2 months ago. It usually resolves on its own. She reports she has not been able to keep any food down but can tolerate water. She has been taking ibuprofen which has previously helped for this pain, but did not help. She denies any bilious material or blood in her vomit. States she has been having normal bowel movement; denies diarrhea or  constipation. Last BM was 2 days ago. Denies blood in her stool, melena, chest pain, shortness of breath, diarrhea, dizziness, lightheadedness, headache.   Review Of Systems: Per HPI.  ROS  Patient Active Problem List   Diagnosis Date Noted  . Abdominal pain 01/26/2017  . Nausea with vomiting 01/26/2017  . Preventive measure 01/07/2016  . Female genital mutilation with clitorectomy 01/07/2016  . Seasonal allergies 12/08/2015  . Lower extremity edema 12/08/2015  . Contraception 12/08/2015  . Immigrant with language difficulty 08/04/2014  . Language barrier 08/04/2014    Past Medical History: Past Medical History:  Diagnosis Date  . Anemia     Past Surgical History: Past Surgical History:  Procedure Laterality Date  . CESAREAN SECTION  12/22/2014   x 2 both in [place]  . CESAREAN SECTION N/A 12/22/2014   Procedure: CESAREAN SECTION;  Surgeon: Tereso Newcomer, MD;  Location: [facility];  Service: Obstetrics;  Laterality: N/A;    Social History: Social History  Substance Use Topics  . Smoking status: Never Smoker  . Smokeless tobacco: Never Used  . Alcohol use No   Additional social history:  Please also refer to relevant sections of EMR.  Family History: Family History  Problem Relation Age of Onset  . Hypertension Mother     Allergies and Medications: No Known Allergies   No current facility-administered medications on file prior to encounter.    Current Outpatient Prescriptions on File Prior to Encounter  Medication Sig Dispense Refill  . cetirizine (ZYRTEC) 10 MG tablet Take 1 tablet (10 mg total) by mouth daily. 30 tablet 11  . dextromethorphan-guaiFENesin (MUCINEX DM)  30-600 MG 12hr tablet Take 1 tablet by mouth 2 (two) times daily. 20 tablet 0  . norgestimate-ethinyl estradiol (ORTHO-CYCLEN,SPRINTEC,PREVIFEM) 0.25-35 MG-MCG tablet Take 1 tablet by mouth daily. 1 Package 11    Objective: BP 110/84   Pulse 82   Temp 97.8 F (36.6 C) (Oral)   Ht  (1.6  m)   Wt 175 lb 3.2 oz (79.5 kg)   LMP 12/21/2016 (Exact Date)   SpO2 99%   BMI 31.04 kg/m   Gen:  31 y.o. female in NAD, well-appearing, well-nourished, pleasant HEENT: NCAT, MMM, anicteric sclerae, EOMI CV: RRR, no MRG Resp: Non-labored, CTAB, no wheezes or rales  Abd: Soft, ND, BS decreased, no guarding or organomegaly, TTP in epigastric region, negative Murphys sign Ext: WWP, no edema MSK: Moves all extremities, no obvious deformity Neuro: Alert and oriented, speech normal, no focal deficits, strength intact Skin: no rashes or skin breakdown on visible skin Psych: mood and affect appropriate  Labs and Imaging: None  Pincus Large, DO 01/26/2017, 11:42 AM PGY-3, [facility] FPTS Intern pager: [phone], text pages welcome

## 2017-01-26 NOTE — Assessment & Plan Note (Signed)
Vital signs stable currently Patient without tenderness on exam, but reporting significant increase in pain after eating Unable to keep any food or liquids down for 3 days and continues to vomit despite not eating this morning Some concern for bowel structure and and does have risk factor given 3 previous C-sections Could have gastritis or gastric ulcer though without hematemesis it does not seem it is bleeding currently It is possible that she has undiagnosed gastroparesis, but she does not have risk factors for this We will admit patient to the hospital for IV fluid resuscitation as well as CT abdomen and pelvis to evaluate for bowel obstruction May also need GI consult for EGD to evaluate for PUD

## 2017-01-26 NOTE — Progress Notes (Signed)
[name] is a 31 y.o. female patient admitted from ED awake, alert - oriented  X 4 - no acute distress noted.  VSS - Blood pressure 118/71, pulse 81, temperature 98 F (36.7 C), temperature source Oral, resp. rate 17, height  (1.6 m), weight 80.1 kg (176 lb 9.6 oz), last menstrual period 01/26/2017, SpO2 100 %, unknown if currently breastfeeding.    IV in place, occlusive dsg intact without redness.  Orientation to room, and floor completed with information packet given to patient/family.  Patient declined safety video at this time.  Admission INP armband ID verified with patient/family, and in place.   SR up x 2, fall assessment complete, with patient and family able to verbalize understanding of risk associated with falls, and verbalized understanding to call nsg before up out of bed.  Call light within reach, patient able to voice, and demonstrate understanding.  Skin, clean-dry- intact without evidence of bruising, or skin tears.   No evidence of skin break down noted on exam.     Will cont to eval and treat per MD orders.  Casper Harrison Tabari Volkert, RN 01/26/2017 7:35 PM

## 2017-01-26 NOTE — Assessment & Plan Note (Signed)
See plan for abdominal pain above

## 2017-01-27 ENCOUNTER — Inpatient Hospital Stay (HOSPITAL_COMMUNITY): Payer: BLUE CROSS/BLUE SHIELD

## 2017-01-27 ENCOUNTER — Encounter (HOSPITAL_COMMUNITY): Payer: Self-pay | Admitting: Radiology

## 2017-01-27 LAB — COMPREHENSIVE METABOLIC PANEL
ALT: 335 U/L — ABNORMAL HIGH (ref 14–54)
AST: 223 U/L — ABNORMAL HIGH (ref 15–41)
Albumin: 3.7 g/dL (ref 3.5–5.0)
Alkaline Phosphatase: 142 U/L — ABNORMAL HIGH (ref 38–126)
Anion gap: 11 (ref 5–15)
BUN: 9 mg/dL (ref 6–20)
CO2: 21 mmol/L — ABNORMAL LOW (ref 22–32)
Calcium: 8.7 mg/dL — ABNORMAL LOW (ref 8.9–10.3)
Chloride: 108 mmol/L (ref 101–111)
Creatinine, Ser: 0.64 mg/dL (ref 0.44–1.00)
GFR calc Af Amer: >60 mL/min (ref >60)
GFR calc non Af Amer: >60 mL/min (ref >60)
Glucose, Bld: 95 mg/dL (ref 65–99)
Potassium: 3.3 mmol/L — ABNORMAL LOW (ref 3.5–5.1)
Sodium: 140 mmol/L (ref 135–145)
Total Bilirubin: 1 mg/dL (ref 0.3–1.2)
Total Protein: 7.7 g/dL (ref 6.5–8.1)

## 2017-01-27 LAB — CBC WITH DIFFERENTIAL/PLATELET
Basophils Absolute: 0 10*3/uL (ref 0.0–0.1)
Basophils Relative: 0 %
Eosinophils Absolute: 0.2 10*3/uL (ref 0.0–0.7)
Eosinophils Relative: 3 %
HCT: 34 % — ABNORMAL LOW (ref 36.0–46.0)
Hemoglobin: 10.1 g/dL — ABNORMAL LOW (ref 12.0–15.0)
Lymphocytes Relative: 37 %
Lymphs Abs: 2.1 10*3/uL (ref 0.7–4.0)
MCH: 19.2 pg — ABNORMAL LOW (ref 26.0–34.0)
MCHC: 29.7 g/dL — ABNORMAL LOW (ref 30.0–36.0)
MCV: 64.6 fL — ABNORMAL LOW (ref 78.0–100.0)
Monocytes Absolute: 0.3 10*3/uL (ref 0.1–1.0)
Monocytes Relative: 6 %
Neutro Abs: 3 10*3/uL (ref 1.7–7.7)
Neutrophils Relative %: 54 %
Platelets: 413 10*3/uL — ABNORMAL HIGH (ref 150–400)
RBC: 5.26 MIL/uL — ABNORMAL HIGH (ref 3.87–5.11)
RDW: 17.5 % — ABNORMAL HIGH (ref 11.5–15.5)
WBC: 5.6 10*3/uL (ref 4.0–10.5)

## 2017-01-27 LAB — PREGNANCY, URINE: Preg Test, Ur: NEGATIVE

## 2017-01-27 LAB — FOLATE: Folate: 47.7 ng/mL (ref >5.9)

## 2017-01-27 MED ORDER — IOPAMIDOL (ISOVUE-300) INJECTION 61%
INTRAVENOUS | Status: AC
  Administered 2017-01-27: 100 mL
  Filled 2017-01-27: qty 100

## 2017-01-27 MED ORDER — POTASSIUM CHLORIDE CRYS ER 20 MEQ PO TBCR
40.0000 meq | EXTENDED_RELEASE_TABLET | Freq: Two times a day (BID) | ORAL | Status: AC
Start: 2017-01-27 — End: 2017-01-27
  Administered 2017-01-27 (×2): 40 meq via ORAL
  Filled 2017-01-27 (×2): qty 2

## 2017-01-27 MED ORDER — IOPAMIDOL (ISOVUE-300) INJECTION 61%
INTRAVENOUS | Status: AC
  Administered 2017-01-27: 30 mL
  Filled 2017-01-27: qty 30

## 2017-01-27 NOTE — Progress Notes (Signed)
Followed up with patient. She states that she did not eat dinner. She does not feel well enough to go home tonight. Will continue to monitor.

## 2017-01-27 NOTE — Discharge Summary (Signed)
Family Medicine Teaching [facility] Discharge Summary  Patient name: [name] Medical record number: [id number] Date of birth: 04-09-1986 Age: 31 y.o. Gender: female Date of Admission: 01/26/2017  Date of Discharge: 02/02/2017 Admitting Physician: Carney Living, MD  Primary Care Provider: Tobey Grim, MD Consultants: Gastroenterology, General Surgery  Indication for Hospitalization: Epigastric pain and vomiting  Discharge Diagnoses/Problem List:  Epigastric pain Nonbilious non-bloody emesis Transaminitis Iron deficiency anemia   Disposition: Home  Discharge Condition: Stable, improved  Discharge Exam:  General: 31 yo F, NAD, laying in bed with family at bedside  CV: RRR no MRG  Lungs: CTA B/L with comfortable WOB  Abdomen: soft, NTND, chole ports c/d/I with no surrounding erythema or swelling, +bs Skin: warm, dry  Brief Hospital Course:  [name] a 31 y.o.femalepresenting with abdominal pain, N/V. No significant PMH.  Patient experienced epigastric abdominal pain with emesis during meals for 3 days. Pain was severe and intermittent without relief with vomiting. Patient stated this was a similar pain she is experiencing 2 months ago. She denied history of fevers or chills, diarrhea or constipation, chest pain or shortness of breath, lightheadedness, melena, or hematochezia. Patient presented to family medicine clinic and instructed to go to [facility] ED.  Upon arrival, VS stable. Patient remained afebrile with borderline tachycardia. Pertinent labs: Hemoglobin 9.6 (BL 11), MCV 64, elliptocytes target cells on smear, without leukocytosis, electrolytes unremarkable, liver enzymes elevated with AST 349, ALT 392, alkaline phosphatase 139, however normal total bili 0.7, lipase negative, UA contaminant, pregnancy negative, hepatitis panel negative, iron studies consistent with IDA without left shift. CT abdomen and pelvis consistent with fatty infiltration of liver,  small umbilical hernia, and questionable nonobstructing renal calculi. Abdominal exam consistent with epigastric tenderness with decreased bowel sounds with negative Murphy sign. GI evaluated patient and recommended performing MRCP consistent with cholelithiasis without cholecystitis, otherwise unremarkable. EGD performed unremarkable. Recommended evaluation with general surgery and recommended laparoscopic cholecystectomy.  Patient underwent this surgery on 5/1 with no intraoperative complications.  The following morning she was comfortable and tolerating liquid diet without nausea.  Benign abdominal exam and bowel sounds present.    Of note, workup for autoimmune hepatitis and viral hepatitis returned negative.  Per GI she did not require follow up for this.  She was clear for discharge from surgical standpoint as well however was monitored overnight for one additional day in setting of tachycardia and requiring ferreheme the following day for low iron level.  She was then discharged home in stable condition.    Issues for Follow Up:  1. Follow up lap chole port sites to ensure healing well.  2. Discharged on iron supplementation and opioids with stool softeners. May need miralax for possible constipation.  3.   Consider outpatient referral to OB/GYN for workup of menorrhagia. 4.  Please help patient obtain Hep B vaccination as she is not immune.   Significant Procedures:  MRCP (04/28) EGD (04/29) Lap chole 5/1.   Significant Labs and Imaging:   Recent Labs Lab 01/31/17 1416 02/01/17 0535 02/02/17 0316  WBC 6.3 8.6 7.3  HGB 10.0* 9.1* 8.5*  HCT 32.2* 30.2* 29.3*  PLT 315 374 342    Recent Labs Lab 01/29/17 0551 01/30/17 0428 01/31/17 0346 01/31/17 0700 01/31/17 1416 02/01/17 0535 02/01/17 1000 02/02/17 0316  NA 140 139 139  --   --  135  --  138  K 4.0 3.8 3.7  --   --  3.8  --  3.4*  CL 108 107 108  --   --  102  --  105  CO2 20* 24 23  --   --  25  --  24  GLUCOSE 92  120* 114*  --   --  124*  --  104*  BUN --   --  <5*  --  <5*  CREATININE 0.63 0.60 0.58  --  0.62 0.59  --  0.57  CALCIUM 9.1 9.1 9.0  --   --  8.6*  --  8.5*  ALKPHOS 146*  --   --  105  --   --  87  --   AST 240*  --   --  63*  --   --  80*  --   ALT 364*  --   --  180*  --   --  149*  --   ALBUMIN 3.4*  --   --  3.2*  --   --  3.0*  --     Results/Tests Pending at Time of Discharge: None   Discharge Medications:  Allergies as of 02/02/2017   No Known Allergies     Medication List    TAKE these medications   ferrous sulfate 325 (65 FE) MG tablet Take 1 tablet (325 mg total) by mouth every other day.   ibuprofen 200 MG tablet Commonly known as:  ADVIL,MOTRIN Take 400 mg by mouth every 6 (six) hours as needed.   oxyCODONE 5 MG immediate release tablet Commonly known as:  Oxy IR/ROXICODONE Take 1-2 tablets (5-10 mg total) by mouth every 4 (four) hours as needed for moderate pain.   pantoprazole 40 MG tablet Commonly known as:  PROTONIX Take 1 tablet (40 mg total) by mouth daily.   senna-docusate 8.6-50 MG tablet Commonly known as:  Senokot-S Take 1 tablet by mouth daily.       Discharge Instructions: Please refer to Patient Instructions section of EMR for full details.  Patient was counseled important signs and symptoms that should prompt return to medical care, changes in medications, dietary instructions, activity restrictions, and follow up appointments.   Follow-Up Appointments: Follow-up Information    Surgery, [place] Follow up.   Specialty:  General Surgery Why:  Our office will call with follow up in the [facility] clinic.  If you do not hear by 02/06/17 call and ask.   Contact information: [address] [place] [address] [phone]        [name], MD. Go on 02/08/2017.   Why:  1:30pm for hospital follow up with Dr. Fuller Mandril information: [address] [place] [address] [phone]           Freddrick March,  MD 02/04/2017, 12:18 AM PGY-1, [facility]

## 2017-01-27 NOTE — Progress Notes (Signed)
Family Medicine Teaching Service Daily Progress Note Intern Pager: [phone]  Patient name: [name] Medical record number: [id number] Date of birth: Feb 20, 1986 Age: 31 y.o. Gender: female  Primary Care Provider: Renold Don, MD Consultants: None Code Status: Full  Pt Overview and Major Events to Date:  04/26: Admit for epigastric pain and vomiting  Assessment and Plan: [name] is a 31 y.o. female presenting with stomach pain . No significant PMH.  #Epigastric pain  NBNB emesis: Acute. Resolved. VS stable. Vomiting resolved. Patient endorses exacerbation after eating. Likely PUD, gastritis. Pregnancy ruled out. --IVF NS@125mL /hr --Advance diet as tolerated --Will hold on consult to GI for EGD given result epigastric pain on PPI --Zofran prn for nausea/vomiting --Protonix 40 mg daily  #Iron deficiency anemia: Likely chronic. Stable. Hemoglobin baseline 9.5-10. Iron studies support this finding. Possibly due to GI bleed, however the need to obtain FOBT to rule out. --Will need to follow-up outpatient --Daily CBC  FEN/GI: Advance diet as tolerated Prophylaxis: SCDs, risk of bleeding ulcer so hold off on anticoagulation  Disposition: Pending advancement of diet with possible discharge this evening, 4/27.  Subjective:  Epigastric pain much improved. Nausea vomiting resolved. Denies shortness of breath or chest pain.  Objective: Temp:  [97.8 F (36.6 C)-98.3 F (36.8 C)] 98.3 F (36.8 C) (04/27 0513) Pulse Rate:  [80-85] 85 (04/27 0513) Resp:  [16-18] 18 (04/27 0513) BP: (95-118)/(52-84) 95/52 (04/27 0513) SpO2:  [98 %-100 %] 99 % (04/27 0513) Weight:  [175 lb 3.2 oz (79.5 kg)-176 lb 9.6 oz (80.1 kg)] 176 lb 9.6 oz (80.1 kg) (04/26 1620) Physical Exam: General: well nourished, well developed, in no acute distress with non-toxic appearance HEENT: normocephalic, atraumatic, moist mucous membranes CV: regular rate and rhythm without murmurs, rubs, or gallops Lungs:  clear to auscultation bilaterally with normal work of breathing Abdomen: soft, non-tender, no masses or organomegaly palpable, normoactive bowel sounds Skin: warm, dry, no rashes or lesions, cap refill < 2 seconds Extremities: warm and well perfused, normal tone  Laboratory:  Recent Labs Lab 01/26/17 1707  WBC 4.9  HGB 9.6*  HCT 31.2*  PLT 371    Recent Labs Lab 01/26/17 1707  NA 140  K 3.8  CL 107  CO2 23  BUN 10  CREATININE 0.60  CALCIUM 8.8*  PROT 7.8  BILITOT 0.7  ALKPHOS 139*  ALT 392*  AST 349*  GLUCOSE 96   Mag: 2.1 (WNL) Phos: 3.4 (WNL) Lipase: 27 (WNL) UA: Amber, hazy, large hgb, 20 ketones, 100 protein, RBC too numerous to ct, rare bacteria, 6-30 squams Preg: Neg FOBT: Pending Hepatitis panel: Pending Vitamin B-12: 727 (WNL) Folate: 47.7 (WNL) Iron panel: iron 21 (L), TIBC 456 (H), sat ratio 5 (L) Ferritin: 11 (WNL) Retic ct: 1.2 (WNL)  Imaging/Diagnostic Tests: CT ABDOMEN PELVIS W CONTRAST  Pending  Wendee Beavers, DO 01/27/2017, 8:30 AM PGY-1, [facility] FPTS Intern pager: [phone], text pages welcome

## 2017-01-28 ENCOUNTER — Inpatient Hospital Stay (HOSPITAL_COMMUNITY): Payer: BLUE CROSS/BLUE SHIELD

## 2017-01-28 DIAGNOSIS — R1013 Epigastric pain: Secondary | ICD-10-CM

## 2017-01-28 DIAGNOSIS — K8050 Calculus of bile duct without cholangitis or cholecystitis without obstruction: Secondary | ICD-10-CM

## 2017-01-28 LAB — BASIC METABOLIC PANEL
Anion gap: 7 (ref 5–15)
BUN: 6 mg/dL (ref 6–20)
CO2: 23 mmol/L (ref 22–32)
Calcium: 8.7 mg/dL — ABNORMAL LOW (ref 8.9–10.3)
Chloride: 107 mmol/L (ref 101–111)
Creatinine, Ser: 0.62 mg/dL (ref 0.44–1.00)
GFR calc Af Amer: >60 mL/min (ref >60)
GFR calc non Af Amer: >60 mL/min (ref >60)
Glucose, Bld: 103 mg/dL — ABNORMAL HIGH (ref 65–99)
Potassium: 4.3 mmol/L (ref 3.5–5.1)
Sodium: 137 mmol/L (ref 135–145)

## 2017-01-28 LAB — CBC
HCT: 32.5 % — ABNORMAL LOW (ref 36.0–46.0)
HCT: 33.3 % — ABNORMAL LOW (ref 36.0–46.0)
Hemoglobin: 7.5 g/dL — ABNORMAL LOW (ref 12.0–15.0)
Hemoglobin: 10.1 g/dL — ABNORMAL LOW (ref 12.0–15.0)
MCH: 14.9 pg — ABNORMAL LOW (ref 26.0–34.0)
MCH: 19.6 pg — ABNORMAL LOW (ref 26.0–34.0)
MCHC: 23.1 g/dL — ABNORMAL LOW (ref 30.0–36.0)
MCHC: 30.3 g/dL (ref 30.0–36.0)
MCV: 64.6 fL — ABNORMAL LOW (ref 78.0–100.0)
MCV: 64.5 fL — ABNORMAL LOW (ref 78.0–100.0)
Platelets: 346 10*3/uL (ref 150–400)
Platelets: 426 10*3/uL — ABNORMAL HIGH (ref 150–400)
RBC: 5.03 MIL/uL (ref 3.87–5.11)
RBC: 5.16 MIL/uL — ABNORMAL HIGH (ref 3.87–5.11)
RDW: 17.4 % — ABNORMAL HIGH (ref 11.5–15.5)
RDW: 17.4 % — ABNORMAL HIGH (ref 11.5–15.5)
WBC: 6.1 10*3/uL (ref 4.0–10.5)
WBC: 5.4 10*3/uL (ref 4.0–10.5)

## 2017-01-28 LAB — HEPATITIS PANEL, ACUTE
HCV Ab: <0.1 s/co ratio (ref 0.0–0.9)
Hep A IgM: NEGATIVE
Hep B C IgM: NEGATIVE
Hepatitis B Surface Ag: NEGATIVE

## 2017-01-28 LAB — HEMOGLOBIN AND HEMATOCRIT, BLOOD
HCT: 32.7 % — ABNORMAL LOW (ref 36.0–46.0)
Hemoglobin: 10 g/dL — ABNORMAL LOW (ref 12.0–15.0)

## 2017-01-28 MED ORDER — ALUM & MAG HYDROXIDE-SIMETH 200-200-20 MG/5ML PO SUSP
15.0000 mL | Freq: Four times a day (QID) | ORAL | Status: DC | PRN
  Administered 2017-01-28: 15 mL via ORAL
  Filled 2017-01-28: qty 30

## 2017-01-28 MED ORDER — GADOBENATE DIMEGLUMINE 529 MG/ML IV SOLN
18.0000 mL | Freq: Once | INTRAVENOUS | Status: AC | PRN
  Administered 2017-01-28: 18 mL via INTRAVENOUS

## 2017-01-28 MED ORDER — PANTOPRAZOLE SODIUM 40 MG PO TBEC
40.0000 mg | DELAYED_RELEASE_TABLET | Freq: Every day | ORAL | Status: DC
  Administered 2017-01-28 – 2017-02-02 (×4): 40 mg via ORAL
  Filled 2017-01-28 (×4): qty 1

## 2017-01-28 NOTE — Consult Note (Signed)
Consult Note for  GI  Reason for Consult: Epigastric abdominal pain, nausea/vomiting, elevated liver enzymes Referring Physician: [facility]  [name] HPI: This is a 31 year old female without any significant PMH admitted to the hospital with complaints of epigastric abdominal pain with nausea and vomiting.  She started to have issues 3-4 days ago.  Her liver enzymes on admission were as follows: AST 349, ALT 392, and AP 139.  The liver enzymes declined to AST 223, ALT 335, but the AP increased mildly to 142.   Her CT scan was negative for any evidence of stones or biliary ductal dilation.  Her TB remains normal.  Her last pregnancy was in 2016 and she is s/p C-section x 2 with a total of 3 children.  The last child's birth date was 12/22/2014.  Two months ago she had severe episodes of epigastric pain, nausea, and vomiting precipitated by PO intake.  After significant vomiting with her most severe attack, her epigastric pain resolved until now.  No reports of any fever.  During this hospitalization she was also found to have an anemia with her HGB dropping down to 7.5 g/dL.  Past Medical History:  Diagnosis Date  . Anemia     Past Surgical History:  Procedure Laterality Date  . CESAREAN SECTION  2009; 2011; 2016   x 2 in [place]  . CESAREAN SECTION N/A 12/22/2014   Procedure: CESAREAN SECTION;  Surgeon: Tereso Newcomer, MD;  Location: [facility];  Service: Obstetrics;  Laterality: N/A;    Family History  Problem Relation Age of Onset  . Hypertension Mother     Social History:  reports that she has never smoked. She has never used smokeless tobacco. She reports that she does not drink alcohol or use drugs.  Allergies: No Known Allergies  Medications:  Scheduled: . pantoprazole  40 mg Oral Daily   Continuous:   Results for orders placed or performed during the hospital encounter of 01/26/17 (from the past 24 hour(s))  CBC     Status: Abnormal   Collection Time:  01/28/17  4:53 AM  Result Value Ref Range   WBC 6.1 4.0 - 10.5 K/uL   RBC 5.03 3.87 - 5.11 MIL/uL   Hemoglobin 7.5 (L) 12.0 - 15.0 g/dL   HCT 16.1 (L) 09.6 - 04.5 %   MCV 64.6 (L) 78.0 - 100.0 fL   MCH 14.9 (L) 26.0 - 34.0 pg   MCHC 23.1 (L) 30.0 - 36.0 g/dL   RDW 40.9 (H) 81.1 - 91.4 %   Platelets 346 150 - 400 K/uL  Basic metabolic panel     Status: Abnormal   Collection Time: 01/28/17  4:53 AM  Result Value Ref Range   Sodium 137 135 - 145 mmol/L   Potassium 4.3 3.5 - 5.1 mmol/L   Chloride 107 101 - 111 mmol/L   CO2 23 22 - 32 mmol/L   Glucose, Bld 103 (H) 65 - 99 mg/dL   BUN 6 6 - 20 mg/dL   Creatinine, Ser 7.82 0.44 - 1.00 mg/dL   Calcium 8.7 (L) 8.9 - 10.3 mg/dL   GFR calc non Af Amer >60 >60 mL/min   GFR calc Af Amer >60 >60 mL/min   Anion gap 7 5 - 15  Hemoglobin and hematocrit, blood     Status: Abnormal   Collection Time: 01/28/17 10:40 AM  Result Value Ref Range   Hemoglobin 10.0 (L) 12.0 - 15.0 g/dL   HCT 95.6 (L)  36.0 - 46.0 %     Ct Abdomen Pelvis W Contrast  Result Date: 01/27/2017 CLINICAL DATA:  Epigastric abdominal pain for 2 days EXAM: CT ABDOMEN AND PELVIS WITH CONTRAST TECHNIQUE: Multidetector CT imaging of the abdomen and pelvis was performed using the standard protocol following bolus administration of intravenous contrast. CONTRAST:  100 ML ISOVUE-300 IOPAMIDOL (ISOVUE-300) INJECTION 61% IV. Dilute oral contrast. COMPARISON:  None FINDINGS: Lower chest: Lung bases clear Hepatobiliary: Diffuse fatty infiltration of liver. Gallbladder and liver otherwise unremarkable. Pancreas: Normal appearance Spleen: Normal appearance Adrenals/Urinary Tract: Adrenal glands normal appearance. Question tiny calculi versus early excretion of contrast material in the renal collecting systems. No hydronephrosis or ureteral dilatation. No evidence of renal mass. Bladder unremarkable. Stomach/Bowel: Normal appendix. Scattered respiratory motion artifacts. Stomach and bowel loops  otherwise grossly unremarkable. Vascular/Lymphatic: Normal appearance Reproductive: Uterus and adnexa normal appearance Other: No definite free air or free fluid. Small umbilical hernia containing fat. No inflammatory process identified. Musculoskeletal: Osseous structures unremarkable. IMPRESSION: Fatty infiltration of liver. Question tiny nonobstructing renal calculi versus early excretion of contrast material into the renal collecting systems. Small umbilical hernia containing fat. No definite acute intra-abdominal or intrapelvic abnormalities. Electronically Signed   By: Ulyses Southward M.D.   On: 01/27/2017 16:13    ROS:  As stated above in the HPI otherwise negative.  Blood pressure 102/63, pulse 99, temperature 97.7 F (36.5 C), resp. rate 18, height  (1.6 m), weight 80.1 kg (176 lb 9.6 oz), last menstrual period 01/26/2017, SpO2 100 %, unknown if currently breastfeeding.    PE: Gen: NAD, Alert and Oriented HEENT:  [place]/AT, EOMI Neck: Supple, no LAD Lungs: CTA Bilaterally CV: RRR without M/G/R ABM: Soft, NTND, +BS Ext: No C/C/E  Assessment/Plan: 1) Abnormal liver enzymes. 2) Nausea/Vomiting. 3) Anemia. 4) ABM pain.   With her current presentation of nausea, vomiting, and abdominal pain with the elevated liver enzymes, I am concerned about stones.  Her clinical history is consistent with biliary colic.  The CT scan was negative for any evidence of biliary ductal dilation or stones in the gallbladder, but a MRCP will help to further evaluate this issue.  Her HGB has also dropped down to a low if 7.5 g/dL and she has an iron saturation of 5%.  I will determine if she needs an ERCP versus an EGD.  If the MRCP is positive for choledocholithiasis, I will perform the ERCP tomorrow and it will allow me to address any upper GI source of bleeding.  If the MRCP is negative, I will perform an EGD for her anemia.    Anyah Swallow D 01/28/2017, 11:56 AM

## 2017-01-28 NOTE — Progress Notes (Signed)
Family Medicine Teaching Service Daily Progress Note Intern Pager: [phone]  Patient name: [name] Medical record number: [id number] Date of birth: [birth year] Age: 31 y.o. Gender: female  Primary Care Provider: Renold Don, MD Consultants: None Code Status: Full  Pt Overview and Major Events to Date:  04/26: Admit for epigastric pain and vomiting  Assessment and Plan: [name] is a 31 y.o. female presenting with abd pain, N/V. No significant PMH.  #Epigastric pain  NBNB emesis: Acute. VS stable. Vomiting resolved but now recurrent with advancing diet. Patient endorses exacerbation after eating. Differential includes gastroparesis (due to vomiting after eating and then pain improving with vomiting), PUD (more concern given Hgb drop), gastritis. Pregnancy ruled out. Lipase wnl. --IVF NS@125mL /hr --GI consult this AM --Zofran prn for nausea/vomiting --Protonix 40 mg daily  #Iron deficiency anemia: Likely acute on chronic. Hemoglobin dropped from 10.1 > 7.5 today. Hemoglobin baseline 9.5-10. Iron studies support this finding. Possibly due to GI bleed, however the need to obtain FOBT to rule out.  --Recheck H&H this AM  To confirm lab finding -- GI to eval for GI bleed -- Needs FOBT  FEN/GI: Advance diet as tolerated, mIVF Prophylaxis: SCDs, risk of bleeding ulcer so hold off on anticoagulation  Disposition: Pending further GI w/u and stabilization of Hgb  Subjective:  Epigastric pain recurrent.  Tried to eat dinner. Vomited soon after and epigastric pain worsened.  Epigastric pain then improved somewhat after vomiting.  Feels too unwell to eat breakfast this AM.  Objective: Temp:  [97.7 F (36.5 C)-98.4 F (36.9 C)] 97.7 F (36.5 C) (04/28 0555) Pulse Rate:  [78-99] 99 (04/28 0555) Resp:  [16-18] 18 (04/28 0555) BP: (102-107)/(63-68) 102/63 (04/28 0555) SpO2:  [100 %] 100 % (04/28 0555) Physical Exam: General: well nourished, well developed, in no acute distress with  non-toxic appearance HEENT: normocephalic, atraumatic, moist mucous membranes CV: regular rate and rhythm without murmurs, rubs, or gallops Lungs: clear to auscultation bilaterally with normal work of breathing Abdomen: soft, TTP in epigastrium and mildly in RLQ, no masses or organomegaly palpable, normoactive bowel sounds Skin: warm, dry, no rashes or lesions, cap refill < 2 seconds Extremities: warm and well perfused, normal tone  Laboratory:  Recent Labs Lab 01/26/17 1707 01/27/17 0756 01/28/17 0453  WBC 4.9 5.6 6.1  HGB 9.6* 10.1* 7.5*  HCT 31.2* 34.0* 32.5*  PLT 371 413* 346    Recent Labs Lab 01/26/17 1707 01/27/17 0756 01/28/17 0453  NA 140 140 137  K 3.8 3.3* 4.3  CL 107 108 107  CO2 23 21* 23  BUN CREATININE 0.60 0.64 0.62  CALCIUM 8.8* 8.7* 8.7*  PROT 7.8 7.7  --   BILITOT 0.7 1.0  --   ALKPHOS 139* 142*  --   ALT 392* 335*  --   AST 349* 223*  --   GLUCOSE 96 95 103*   Mag: 2.1 (WNL) Phos: 3.4 (WNL) Lipase: 27 (WNL) UA: Amber, hazy, large hgb, 20 ketones, 100 protein, RBC too numerous to ct, rare bacteria, 6-30 squams Preg: Neg FOBT: Pending Hepatitis panel: Pending Vitamin B-12: 727 (WNL) Folate: 47.7 (WNL) Iron panel: iron 21 (L), TIBC 456 (H), sat ratio 5 (L) Ferritin: 11 (WNL) Retic ct: 1.2 (WNL)  Imaging/Diagnostic Tests: Ct Abdomen Pelvis W Contrast  Result Date: 01/27/2017 CLINICAL DATA:  Epigastric abdominal pain for 2 days EXAM: CT ABDOMEN AND PELVIS WITH CONTRAST TECHNIQUE: Multidetector CT imaging of the abdomen and pelvis was performed using the  standard protocol following bolus administration of intravenous contrast. CONTRAST:  100 ML ISOVUE-300 IOPAMIDOL (ISOVUE-300) INJECTION 61% IV. Dilute oral contrast. COMPARISON:  None FINDINGS: Lower chest: Lung bases clear Hepatobiliary: Diffuse fatty infiltration of liver. Gallbladder and liver otherwise unremarkable. Pancreas: Normal appearance Spleen: Normal appearance Adrenals/Urinary  Tract: Adrenal glands normal appearance. Question tiny calculi versus early excretion of contrast material in the renal collecting systems. No hydronephrosis or ureteral dilatation. No evidence of renal mass. Bladder unremarkable. Stomach/Bowel: Normal appendix. Scattered respiratory motion artifacts. Stomach and bowel loops otherwise grossly unremarkable. Vascular/Lymphatic: Normal appearance Reproductive: Uterus and adnexa normal appearance Other: No definite free air or free fluid. Small umbilical hernia containing fat. No inflammatory process identified. Musculoskeletal: Osseous structures unremarkable. IMPRESSION: Fatty infiltration of liver. Question tiny nonobstructing renal calculi versus early excretion of contrast material into the renal collecting systems. Small umbilical hernia containing fat. No definite acute intra-abdominal or intrapelvic abnormalities. Electronically Signed   By: Ulyses Southward M.D.   On: 01/27/2017 16:13     Erasmo Downer, MD 01/28/2017, 8:56 AM PGY-3, [facility] FPTS Intern pager: [phone], text pages welcome

## 2017-01-29 ENCOUNTER — Encounter (HOSPITAL_COMMUNITY): Payer: Self-pay

## 2017-01-29 ENCOUNTER — Encounter (HOSPITAL_COMMUNITY)
Admission: AD | Disposition: A | Discharge status: Home / Self Care | Payer: Self-pay | Source: Ambulatory Visit | Attending: Family Medicine

## 2017-01-29 HISTORY — PX: ESOPHAGOGASTRODUODENOSCOPY: SHX5428

## 2017-01-29 LAB — COMPREHENSIVE METABOLIC PANEL
ALT: 364 U/L — ABNORMAL HIGH (ref 14–54)
AST: 240 U/L — ABNORMAL HIGH (ref 15–41)
Albumin: 3.4 g/dL — ABNORMAL LOW (ref 3.5–5.0)
Alkaline Phosphatase: 146 U/L — ABNORMAL HIGH (ref 38–126)
Anion gap: 12 (ref 5–15)
BUN: 6 mg/dL (ref 6–20)
CO2: 20 mmol/L — ABNORMAL LOW (ref 22–32)
Calcium: 9.1 mg/dL (ref 8.9–10.3)
Chloride: 108 mmol/L (ref 101–111)
Creatinine, Ser: 0.63 mg/dL (ref 0.44–1.00)
GFR calc Af Amer: >60 mL/min (ref >60)
GFR calc non Af Amer: >60 mL/min (ref >60)
Glucose, Bld: 92 mg/dL (ref 65–99)
Potassium: 4 mmol/L (ref 3.5–5.1)
Sodium: 140 mmol/L (ref 135–145)
Total Bilirubin: 1 mg/dL (ref 0.3–1.2)
Total Protein: 7.9 g/dL (ref 6.5–8.1)

## 2017-01-29 LAB — CBC
HCT: 32.9 % — ABNORMAL LOW (ref 36.0–46.0)
Hemoglobin: 9.7 g/dL — ABNORMAL LOW (ref 12.0–15.0)
MCH: 19.1 pg — ABNORMAL LOW (ref 26.0–34.0)
MCHC: 29.5 g/dL — ABNORMAL LOW (ref 30.0–36.0)
MCV: 64.9 fL — ABNORMAL LOW (ref 78.0–100.0)
Platelets: 421 10*3/uL — ABNORMAL HIGH (ref 150–400)
RBC: 5.07 MIL/uL (ref 3.87–5.11)
RDW: 17.7 % — ABNORMAL HIGH (ref 11.5–15.5)
WBC: 6.5 10*3/uL (ref 4.0–10.5)

## 2017-01-29 SURGERY — EGD (ESOPHAGOGASTRODUODENOSCOPY)
Anesthesia: Moderate Sedation

## 2017-01-29 MED ORDER — FENTANYL CITRATE (PF) 100 MCG/2ML IJ SOLN
INTRAMUSCULAR | Status: DC | PRN
  Administered 2017-01-29 (×4): 25 ug via INTRAVENOUS

## 2017-01-29 MED ORDER — MIDAZOLAM HCL 10 MG/2ML IJ SOLN
INTRAMUSCULAR | Status: DC | PRN
  Administered 2017-01-29: 1 mg via INTRAVENOUS
  Administered 2017-01-29 (×2): 2 mg via INTRAVENOUS

## 2017-01-29 MED ORDER — SODIUM CHLORIDE 0.9 % IV SOLN
INTRAVENOUS | Status: DC
  Administered 2017-01-29: 15:00:00 via INTRAVENOUS

## 2017-01-29 MED ORDER — FENTANYL CITRATE (PF) 100 MCG/2ML IJ SOLN
INTRAMUSCULAR | Status: AC
  Filled 2017-01-29: qty 2

## 2017-01-29 MED ORDER — MIDAZOLAM HCL 5 MG/ML IJ SOLN
INTRAMUSCULAR | Status: AC
  Filled 2017-01-29: qty 2

## 2017-01-29 NOTE — Op Note (Signed)
[facility] Patient Name: [name] Procedure Date : 01/29/2017 MRN: [id number] Attending MD: Jeani Hawking , MD Date of Birth: 1986/02/27 CSN: [id number] Age: 31 Admit Type: Inpatient Procedure:                Upper GI endoscopy Indications:              Iron deficiency anemia Providers:                Jeani Hawking, MD, Anthony Sar, RN, Judithann Sauger, Technician, Beryle Beams, Technician Referring MD:              Medicines:                Midazolam 5 mg IV, Fentanyl 100 micrograms IV Complications:            No immediate complications. Estimated Blood Loss:     Estimated blood loss: none. Procedure:                Pre-Anesthesia Assessment:                           - Prior to the procedure, a History and Physical                            was performed, and patient medications and                            allergies were reviewed. The patient's tolerance of                            previous anesthesia was also reviewed. The risks                            and benefits of the procedure and the sedation                            options and risks were discussed with the patient.                            All questions were answered, and informed consent                            was obtained. Prior Anticoagulants: The patient has                            taken no previous anticoagulant or antiplatelet                            agents. ASA Grade Assessment: II - A patient with                            mild systemic disease. After reviewing the risks  and benefits, the patient was deemed in                            satisfactory condition to undergo the procedure.                           - Sedation was administered by an endoscopy nurse.                            The sedation level attained was moderate.                           After obtaining informed consent, the endoscope was            passed under direct vision. Throughout the                            procedure, the patient's blood pressure, pulse, and                            oxygen saturations were monitored continuously. The                            EG-[id number] ([id number]) scope was introduced through the                            mouth, and advanced to the second part of duodenum.                            The upper GI endoscopy was accomplished without                            difficulty. The patient tolerated the procedure                            well. Scope In: Scope Out: Findings:      The esophagus was normal.      The stomach was normal.      The examined duodenum was normal. Impression:               - Normal esophagus.                           - Normal stomach.                           - Normal examined duodenum.                           - No specimens collected. Moderate Sedation:      Moderate (conscious) sedation was administered by the endoscopy nurse       and supervised by the endoscopist. The following parameters were       monitored: oxygen saturation, heart rate, blood pressure, and response       to care. Recommendation:           - Return patient to hospital ward  for ongoing care.                           - Resume regular diet.                           - Continue present medications.                           - Surgical consultation for symptomatic                            cholelthiasis.                           - [facility] to follow up in the AM. Procedure Code(s):        --- Professional ---                           [id number], Esophagogastroduodenoscopy, flexible,                            transoral; diagnostic, including collection of                            specimen(s) by brushing or washing, when performed                            (separate procedure) Diagnosis Code(s):        --- Professional ---                           D50.9, Iron deficiency anemia,  unspecified CPT copyright 2016 American Medical Association. All rights reserved. The codes documented in this report are preliminary and upon coder review may  be revised to meet current compliance requirements. Jeani Hawking, MD Jeani Hawking, MD 01/29/2017 4:55:01 PM This report has been signed electronically. Number of Addenda: 0

## 2017-01-29 NOTE — Progress Notes (Signed)
Family Medicine Teaching Service Daily Progress Note Intern Pager: [phone]  Patient name: [name] Medical record number: [id number] Date of birth: 06-Aug-1986 Age: 31 y.o. Gender: female  Primary Care Provider: Renold Don, MD Consultants: GI Code Status: Full  Pt Overview and Major Events to Date:  04/26: Admit for epigastric pain and vomiting  Assessment and Plan: [name] is a 31 y.o. female presenting with abd pain, N/V. No significant PMH.  #Epigastric pain  NBNB emesis  Transaminitis: Acute. VS stable. Vomiting resolved but now recurrent with advancing diet. Patient endorses exacerbation after eating. Differential includes gallstones, gastroparesis (due to vomiting after eating and then pain improving with vomiting), PUD, gastritis. Pregnancy ruled out. Lipase wnl. LFTs climbing --IVF NS@125mL /hr --GI consulting - appreciate recs - MRCP pending read - if pos will get ERCP, if neg EGD --Zofran prn for nausea/vomiting --Protonix 40 mg daily  #Iron deficiency anemia: Likely acute on chronic. Hemoglobin stable at baseline (9.5-10).  Appears value of 7.5 4/28 was spurious. Iron studies support this finding. Possibly due to GI bleed, however the need to obtain FOBT to rule out.  -- daily CBC -- Needs FOBT  FEN/GI: NPO pending possible EGD vs ERCP, mIVF Prophylaxis: SCDs, risk of bleeding ulcer so hold off on anticoagulation  Disposition: Pending further GI w/u  Subjective:  Epigastric pain has settled some with no food in last 24 hours.  Wanting to know what is going to be the next step.  Objective: Temp:  [98 F (36.7 C)-98.3 F (36.8 C)] 98.3 F (36.8 C) (04/29 0555) Pulse Rate:  [98] 98 (04/29 0555) Resp:  [16-18] 18 (04/29 0555) BP: (106-118)/(69-80) 106/72 (04/29 0555) SpO2:  [100 %] 100 % (04/29 0555) Physical Exam: General: well nourished, well developed, in no acute distress with non-toxic appearance HEENT: normocephalic, atraumatic, moist mucous  membranes CV: regular rate and rhythm without murmurs, rubs, or gallops Lungs: clear to auscultation bilaterally with normal work of breathing Abdomen: soft, NTND, no masses or organomegaly palpable, normoactive bowel sounds Skin: warm, dry, no rashes or lesions, cap refill < 2 seconds Extremities: warm and well perfused, normal tone  Laboratory:  Recent Labs Lab 01/28/17 0453 01/28/17 1040 01/28/17 2000 01/29/17 0551  WBC 6.1  --  5.4 6.5  HGB 7.5* 10.0* 10.1* 9.7*  HCT 32.5* 32.7* 33.3* 32.9*  PLT 346  --  426* 421*    Recent Labs Lab 01/26/17 1707 01/27/17 0756 01/28/17 0453 01/29/17 0551  NA 140 140 137 140  K 3.8 3.3* 4.3 4.0  CL 107 108 107 108  CO2 23 21* 23 20*  BUN CREATININE 0.60 0.64 0.62 0.63  CALCIUM 8.8* 8.7* 8.7* 9.1  PROT 7.8 7.7  --  7.9  BILITOT 0.7 1.0  --  1.0  ALKPHOS 139* 142*  --  146*  ALT 392* 335*  --  364*  AST 349* 223*  --  240*  GLUCOSE 96 95 103* 92   Mag: 2.1 (WNL) Phos: 3.4 (WNL) Lipase: 27 (WNL) UA: Amber, hazy, large hgb, 20 ketones, 100 protein, RBC too numerous to ct, rare bacteria, 6-30 squams Preg: Neg FOBT: Pending Hepatitis panel: Pending Vitamin B-12: 727 (WNL) Folate: 47.7 (WNL) Iron panel: iron 21 (L), TIBC 456 (H), sat ratio 5 (L) Ferritin: 11 (WNL) Retic ct: 1.2 (WNL)  Imaging/Diagnostic Tests: Ct Abdomen Pelvis W Contrast  Result Date: 01/27/2017 CLINICAL DATA:  Epigastric abdominal pain for 2 days EXAM: CT ABDOMEN AND PELVIS WITH CONTRAST  TECHNIQUE: Multidetector CT imaging of the abdomen and pelvis was performed using the standard protocol following bolus administration of intravenous contrast. CONTRAST:  100 ML ISOVUE-300 IOPAMIDOL (ISOVUE-300) INJECTION 61% IV. Dilute oral contrast. COMPARISON:  None FINDINGS: Lower chest: Lung bases clear Hepatobiliary: Diffuse fatty infiltration of liver. Gallbladder and liver otherwise unremarkable. Pancreas: Normal appearance Spleen: Normal appearance  Adrenals/Urinary Tract: Adrenal glands normal appearance. Question tiny calculi versus early excretion of contrast material in the renal collecting systems. No hydronephrosis or ureteral dilatation. No evidence of renal mass. Bladder unremarkable. Stomach/Bowel: Normal appendix. Scattered respiratory motion artifacts. Stomach and bowel loops otherwise grossly unremarkable. Vascular/Lymphatic: Normal appearance Reproductive: Uterus and adnexa normal appearance Other: No definite free air or free fluid. Small umbilical hernia containing fat. No inflammatory process identified. Musculoskeletal: Osseous structures unremarkable. IMPRESSION: Fatty infiltration of liver. Question tiny nonobstructing renal calculi versus early excretion of contrast material into the renal collecting systems. Small umbilical hernia containing fat. No definite acute intra-abdominal or intrapelvic abnormalities. Electronically Signed   By: Ulyses Southward M.D.   On: 01/27/2017 16:13    Erasmo Downer, MD 01/29/2017, 8:01 AM PGY-3, [facility] FPTS Intern pager: [phone], text pages welcome

## 2017-01-29 NOTE — Consult Note (Signed)
Reason for Consult:biliary colic Referring Physician: [name], [name] is an 31 y.o. female.  HPI: 31 yo female with 4 days of intermittent epigastric pain. Pain starts in the epigastrium and radiates around to the back. It is associated with nausea and vomiting. She denies diarrhea or constipation. She is being worked up for anemia and had a negative EGD today and negative MRCP yesterday. She also notes starting her menses recently, which generally lasts 7 days.  Past Medical History:  Diagnosis Date  . Anemia     Past Surgical History:  Procedure Laterality Date  . CESAREAN SECTION  2009; 2011; 2016   x 2 in [place]  . CESAREAN SECTION N/A 12/22/2014   Procedure: CESAREAN SECTION;  Surgeon: Osborne Oman, MD;  Location: [facility];  Service: Obstetrics;  Laterality: N/A;    Family History  Problem Relation Age of Onset  . Hypertension Mother     Social History:  reports that she has never smoked. She has never used smokeless tobacco. She reports that she does not drink alcohol or use drugs.  Allergies: No Known Allergies  Medications: I have reviewed the patient's current medications.  Results for orders placed or performed during the hospital encounter of 01/26/17 (from the past 48 hour(s))  CBC     Status: Abnormal   Collection Time: 01/28/17  4:53 AM  Result Value Ref Range   WBC 6.1 4.0 - 10.5 K/uL   RBC 5.03 3.87 - 5.11 MIL/uL   Hemoglobin 7.5 (L) 12.0 - 15.0 g/dL    Comment: DELTA CHECK NOTED REPEATED TO VERIFY    HCT 32.5 (L) 36.0 - 46.0 %   MCV 64.6 (L) 78.0 - 100.0 fL   MCH 14.9 (L) 26.0 - 34.0 pg   MCHC 23.1 (L) 30.0 - 36.0 g/dL   RDW 17.4 (H) 11.5 - 15.5 %   Platelets 346 150 - 400 K/uL  Basic metabolic panel     Status: Abnormal   Collection Time: 01/28/17  4:53 AM  Result Value Ref Range   Sodium 137 135 - 145 mmol/L   Potassium 4.3 3.5 - 5.1 mmol/L    Comment: DELTA CHECK NOTED SPECIMEN HEMOLYZED. HEMOLYSIS MAY AFFECT INTEGRITY OF RESULTS.     Chloride 107 101 - 111 mmol/L   CO2 23 22 - 32 mmol/L   Glucose, Bld 103 (H) 65 - 99 mg/dL   BUN 6 6 - 20 mg/dL   Creatinine, Ser 0.62 0.44 - 1.00 mg/dL   Calcium 8.7 (L) 8.9 - 10.3 mg/dL   GFR calc non Af Amer >60 >60 mL/min   GFR calc Af Amer >60 >60 mL/min    Comment: (NOTE) The eGFR has been calculated using the CKD EPI equation. This calculation has not been validated in all clinical situations. eGFR's persistently <60 mL/min signify possible Chronic Kidney Disease.    Anion gap 7 5 - 15  Hemoglobin and hematocrit, blood     Status: Abnormal   Collection Time: 01/28/17 10:40 AM  Result Value Ref Range   Hemoglobin 10.0 (L) 12.0 - 15.0 g/dL    Comment: REPEATED TO VERIFY DELTA CHECK NOTED    HCT 32.7 (L) 36.0 - 46.0 %  CBC     Status: Abnormal   Collection Time: 01/28/17  8:00 PM  Result Value Ref Range   WBC 5.4 4.0 - 10.5 K/uL   RBC 5.16 (H) 3.87 - 5.11 MIL/uL   Hemoglobin 10.1 (L) 12.0 - 15.0 g/dL  HCT 33.3 (L) 36.0 - 46.0 %   MCV 64.5 (L) 78.0 - 100.0 fL   MCH 19.6 (L) 26.0 - 34.0 pg   MCHC 30.3 30.0 - 36.0 g/dL   RDW 17.4 (H) 11.5 - 15.5 %   Platelets 426 (H) 150 - 400 K/uL  CBC     Status: Abnormal   Collection Time: 01/29/17  5:51 AM  Result Value Ref Range   WBC 6.5 4.0 - 10.5 K/uL   RBC 5.07 3.87 - 5.11 MIL/uL   Hemoglobin 9.7 (L) 12.0 - 15.0 g/dL   HCT 32.9 (L) 36.0 - 46.0 %   MCV 64.9 (L) 78.0 - 100.0 fL   MCH 19.1 (L) 26.0 - 34.0 pg   MCHC 29.5 (L) 30.0 - 36.0 g/dL   RDW 17.7 (H) 11.5 - 15.5 %   Platelets 421 (H) 150 - 400 K/uL  Comprehensive metabolic panel     Status: Abnormal   Collection Time: 01/29/17  5:51 AM  Result Value Ref Range   Sodium 140 135 - 145 mmol/L   Potassium 4.0 3.5 - 5.1 mmol/L   Chloride 108 101 - 111 mmol/L   CO2 20 (L) 22 - 32 mmol/L   Glucose, Bld 92 65 - 99 mg/dL   BUN 6 6 - 20 mg/dL   Creatinine, Ser 0.63 0.44 - 1.00 mg/dL   Calcium 9.1 8.9 - 10.3 mg/dL   Total Protein 7.9 6.5 - 8.1 g/dL   Albumin 3.4 (L) 3.5 -  5.0 g/dL   AST 240 (H) 15 - 41 U/L   ALT 364 (H) 14 - 54 U/L   Alkaline Phosphatase 146 (H) 38 - 126 U/L   Total Bilirubin 1.0 0.3 - 1.2 mg/dL   GFR calc non Af Amer >60 >60 mL/min   GFR calc Af Amer >60 >60 mL/min    Comment: (NOTE) The eGFR has been calculated using the CKD EPI equation. This calculation has not been validated in all clinical situations. eGFR's persistently <60 mL/min signify possible Chronic Kidney Disease.    Anion gap 12 5 - 15    Mr 3d Recon At Scanner  Result Date: 01/29/2017 CLINICAL DATA:  Epigastric pain and nausea with vomiting for 4 days. Cholelithiasis. Hepatic steatosis. EXAM: MRI ABDOMEN WITHOUT AND WITH CONTRAST TECHNIQUE: Multiplanar multisequence MR imaging of the abdomen was performed both before and after the administration of intravenous contrast. CONTRAST:  39m MULTIHANCE GADOBENATE DIMEGLUMINE 529 MG/ML IV SOLN COMPARISON:  CT on 01/27/2017 FINDINGS: Lower chest: No acute findings. Hepatobiliary: No masses identified. Diffuse hepatic steatosis noted. Multiple tiny gallstones are seen, without signs of acute cholecystitis. There is no evidence of biliary ductal dilatation, with common bile duct measuring 5 mm. No evidence of choledocholithiasis. Pancreas: No mass or inflammatory changes. No evidence of pancreatic ductal dilatation. Spleen:  Within normal limits in size and appearance. Adrenals/Urinary Tract: No masses identified. No evidence of hydronephrosis. Stomach/Bowel: Visualized portions within the abdomen are unremarkable. Vascular/Lymphatic: No pathologically enlarged lymph nodes identified. No abdominal aortic aneurysm. Other:  None. Musculoskeletal:  No suspicious bone lesions identified. IMPRESSION: Cholelithiasis, without radiographic evidence of cholecystitis. No evidence of biliary ductal dilatation, choledocholithiasis, or other acute findings. Hepatic steatosis. Electronically Signed   By: JEarle GellM.D.   On: 01/29/2017 07:37   Mr  Abdomen Mrcp WMoise BoringContast  Result Date: 01/29/2017 CLINICAL DATA:  Epigastric pain and nausea with vomiting for 4 days. Cholelithiasis. Hepatic steatosis. EXAM: MRI ABDOMEN WITHOUT AND WITH CONTRAST TECHNIQUE: Multiplanar  multisequence MR imaging of the abdomen was performed both before and after the administration of intravenous contrast. CONTRAST:  5m MULTIHANCE GADOBENATE DIMEGLUMINE 529 MG/ML IV SOLN COMPARISON:  CT on 01/27/2017 FINDINGS: Lower chest: No acute findings. Hepatobiliary: No masses identified. Diffuse hepatic steatosis noted. Multiple tiny gallstones are seen, without signs of acute cholecystitis. There is no evidence of biliary ductal dilatation, with common bile duct measuring 5 mm. No evidence of choledocholithiasis. Pancreas: No mass or inflammatory changes. No evidence of pancreatic ductal dilatation. Spleen:  Within normal limits in size and appearance. Adrenals/Urinary Tract: No masses identified. No evidence of hydronephrosis. Stomach/Bowel: Visualized portions within the abdomen are unremarkable. Vascular/Lymphatic: No pathologically enlarged lymph nodes identified. No abdominal aortic aneurysm. Other:  None. Musculoskeletal:  No suspicious bone lesions identified. IMPRESSION: Cholelithiasis, without radiographic evidence of cholecystitis. No evidence of biliary ductal dilatation, choledocholithiasis, or other acute findings. Hepatic steatosis. Electronically Signed   By: JEarle GellM.D.   On: 01/29/2017 07:37    Review of Systems  Constitutional: Negative for chills and fever.  HENT: Negative for hearing loss.   Eyes: Negative for blurred vision and double vision.  Respiratory: Negative for cough and hemoptysis.   Cardiovascular: Negative for chest pain and palpitations.  Gastrointestinal: Positive for abdominal pain, nausea and vomiting. Negative for blood in stool, constipation and diarrhea.  Genitourinary: Negative for dysuria and urgency.  Musculoskeletal: Negative for  myalgias and neck pain.  Skin: Negative for itching and rash.  Neurological: Negative for dizziness, tingling and headaches.  Endo/Heme/Allergies: Does not bruise/bleed easily.  Psychiatric/Behavioral: Negative for depression and suicidal ideas.   Blood pressure (!) 129/91, pulse (!) 102, temperature 98.4 F (36.9 C), temperature source Oral, resp. rate 18, height 5' 3"  (1.6 m), weight 80.1 kg (176 lb 9.6 oz), last menstrual period 01/26/2017, SpO2 100 %, unknown if currently breastfeeding. Physical Exam  Vitals reviewed. Constitutional: She is oriented to person, place, and time. She appears well-developed and well-nourished.  HENT:  Head: Normocephalic and atraumatic.  Eyes: Conjunctivae and EOM are normal. Pupils are equal, round, and reactive to light.  Neck: Normal range of motion. Neck supple.  Cardiovascular: Normal rate and regular rhythm.   Respiratory: Effort normal and breath sounds normal.  GI: Soft. Bowel sounds are normal. She exhibits no distension. There is tenderness in the right upper quadrant.  Musculoskeletal: Normal range of motion.  Neurological: She is alert and oriented to person, place, and time.  Skin: Skin is warm and dry.  Psychiatric: She has a normal mood and affect. Her behavior is normal.    Assessment/Plan: Biliary colic Iron Deficiency Anemia Transaminitis 31yo female with intermittent epigastric pain related to all types of food and gallstones seen on MRCP consistent with biliary colic. She does have moderate LFT elevation. She does not know if she ever had hepatitis vaccinations. She is also admitted for iron deficiency anemia without source other than menses. -We discussed the etiology of her pain, we discussed treatment options and recommended surgery. We discussed details of surgery including general anesthesia, laparoscopic approach, identification of cystic duct and common bile duct. Ligation of cystic duct and cystic artery. Possible need for  intraoperative cholangiogram or open procedure. Possible risks of common bile duct injury, liver injury, cystic duct leak, bleeding, infection, post-cholecystectomy syndrome. The patient showed good understanding and all questions were answered -Prior to proceeding with surgery, I think the LFTs and bleeding work up should be completed. I do not think her biliary colic is the  source of her bleeding, it may be the source of her LFT elevation, though typically that would come with either imaging changes consistent with acute cholecystitis or at least a leukocytosis -we will continue to follow along  Arta Bruce Davan Nawabi 01/29/2017, 7:40 PM

## 2017-01-30 DIAGNOSIS — K8020 Calculus of gallbladder without cholecystitis without obstruction: Secondary | ICD-10-CM

## 2017-01-30 DIAGNOSIS — D509 Iron deficiency anemia, unspecified: Secondary | ICD-10-CM

## 2017-01-30 DIAGNOSIS — R7989 Other specified abnormal findings of blood chemistry: Secondary | ICD-10-CM

## 2017-01-30 LAB — CBC
HCT: 30.5 % — ABNORMAL LOW (ref 36.0–46.0)
Hemoglobin: 9.3 g/dL — ABNORMAL LOW (ref 12.0–15.0)
MCH: 19.8 pg — ABNORMAL LOW (ref 26.0–34.0)
MCHC: 30.5 g/dL (ref 30.0–36.0)
MCV: 65 fL — ABNORMAL LOW (ref 78.0–100.0)
Platelets: 385 10*3/uL (ref 150–400)
RBC: 4.69 MIL/uL (ref 3.87–5.11)
RDW: 18 % — ABNORMAL HIGH (ref 11.5–15.5)
WBC: 5.7 10*3/uL (ref 4.0–10.5)

## 2017-01-30 LAB — BASIC METABOLIC PANEL
Anion gap: 8 (ref 5–15)
BUN: 11 mg/dL (ref 6–20)
CO2: 24 mmol/L (ref 22–32)
Calcium: 9.1 mg/dL (ref 8.9–10.3)
Chloride: 107 mmol/L (ref 101–111)
Creatinine, Ser: 0.6 mg/dL (ref 0.44–1.00)
GFR calc Af Amer: >60 mL/min (ref >60)
GFR calc non Af Amer: >60 mL/min (ref >60)
Glucose, Bld: 120 mg/dL — ABNORMAL HIGH (ref 65–99)
Potassium: 3.8 mmol/L (ref 3.5–5.1)
Sodium: 139 mmol/L (ref 135–145)

## 2017-01-30 LAB — OCCULT BLOOD X 1 CARD TO LAB, STOOL: Fecal Occult Bld: POSITIVE — AB

## 2017-01-30 MED ORDER — CEFAZOLIN (ANCEF) 1 G IV SOLR: 2.0000 g | INTRAVENOUS | Status: DC

## 2017-01-30 MED ORDER — CEFAZOLIN SODIUM-DEXTROSE 2-4 GM/100ML-% IV SOLN
2.0000 g | INTRAVENOUS | Status: AC
  Administered 2017-01-31: 2 g via INTRAVENOUS
  Filled 2017-01-30 (×2): qty 100

## 2017-01-30 MED ORDER — SODIUM CHLORIDE 0.9 % IV SOLN
510.0000 mg | Freq: Once | INTRAVENOUS | Status: AC
  Administered 2017-01-30: 510 mg via INTRAVENOUS
  Filled 2017-01-30: qty 17

## 2017-01-30 NOTE — Progress Notes (Signed)
Family Medicine Teaching Service Daily Progress Note Intern Pager: [phone]  Patient name: [name] Medical record number: [id number] Date of birth: 1986/08/20 Age: 31 y.o. Gender: female  Primary Care Provider: Renold Don, MD Consultants: Gastroenterology, General Surgery Code Status: Full  Pt Overview and Major Events to Date:  04/26: Admit for epigastric pain and vomiting 04/29: GI determined symptomatic gall stones, otherwise neg EGD/MRCP, Gen surgery consulted  Assessment and Plan: [name] is a 31 y.o. female presenting with abd pain, N/V. No significant PMH.  #Epigastric pain  NBNB emesis  Transaminitis: Acute. Resolved at present. She had a workup consistent with cholelithiasis without cholecystitis. EGD negative. Epigastric pain and nausea with vomiting likely return if not surgically treated. Tolerating regular diet without nausea. General surgery to perform laparoscopic cholecystectomy 5/1, pt NPO at MN.  --GI consulted, appreciate recommendations: ANA, IgG, EBV just to make sure there is not AIH or other viral source for LFTs --Gen. surgery consulted, appreciate recommendations --Zofran prn for nausea/vomiting --Protonix 40 mg daily --Hepatitis B surface antibody pending  #Iron deficiency anemia: Likely acute on chronic. Hemoglobin stable at baseline (9.5-10).  Appears value of 7.5 4/28 was spurious. Iron studies support this finding. Possibly due to GI bleed, however the need to obtain FOBT to rule out. GI recommends referral to OB/GYN for workup of menorrhagia if anemia persists after transfusion. --Daily CBC --FOBT pending --Ferriheme infusion 4/30  FEN/GI: NPO  for laparoscopic cholecystectomy Prophylaxis: SCDs, pending FOBT  Disposition: Pending cholecystectomy likely 5/1.  Subjective:  Patient states her symptoms resolved. She denies abdominal pain, nausea or vomiting,  Objective: Temp:  [98.3 F (36.8 C)-98.4 F (36.9 C)] 98.3 F (36.8 C)  (04/30 0503) Pulse Rate:  [89-113] 92 (04/30 0503) Resp:  [16-18] 18 (04/30 0503) BP: (98-135)/(63-92) 108/63 (04/30 0503) SpO2:  [97 %-100 %] 100 % (04/30 0503) Physical Exam: General: well nourished, well developed, in no acute distress with non-toxic appearance HEENT: normocephalic, atraumatic, moist mucous membranes CV: regular rate and rhythm without murmurs, rubs, or gallops Lungs: clear to auscultation bilaterally with normal work of breathing Abdomen: soft, NTND, no masses or organomegaly palpable, normoactive bowel sounds Skin: warm, dry, no rashes or lesions, cap refill < 2 seconds Extremities: warm and well perfused, normal tone Rectal: no frank blood, appropriate rectal tone, no external or internal hemorrhoids appreciated  Laboratory:  Recent Labs Lab 01/28/17 2000 01/29/17 0551 01/30/17 0428  WBC 5.4 6.5 5.7  HGB 10.1* 9.7* 9.3*  HCT 33.3* 32.9* 30.5*  PLT 426* 421* 385    Recent Labs Lab 01/26/17 1707 01/27/17 0756 01/28/17 0453 01/29/17 0551 01/30/17 0428  NA 140 140 137 140 139  K 3.8 3.3* 4.3 4.0 3.8  CL 107 108 107 108 107  CO2 23 21* 23 20* 24  BUN CREATININE 0.60 0.64 0.62 0.63 0.60  CALCIUM 8.8* 8.7* 8.7* 9.1 9.1  PROT 7.8 7.7  --  7.9  --   BILITOT 0.7 1.0  --  1.0  --   ALKPHOS 139* 142*  --  146*  --   ALT 392* 335*  --  364*  --   AST 349* 223*  --  240*  --   GLUCOSE 96 95 103* 92 120*   Mag: 2.1 (WNL) Phos: 3.4 (WNL) Lipase: 27 (WNL) UA: Amber, hazy, large hgb, 20 ketones, 100 protein, RBC too numerous to ct, rare bacteria, 6-30 squams Preg: Negative FOBT: Pending Hepatitis panel: Negative Vitamin B-12: 727 (  WNL) Folate: 47.7 (WNL) Iron panel: iron 21 (L), TIBC 456 (H), sat ratio 5 (L) Ferritin: 11 (WNL) Retic ct: 1.2 (WNL)  Imaging/Diagnostic Tests: ESOPHAGOGASTRODUODENOSCOPY (01/29/2017) Findings: The esophagus was normal. The stomach was normal. The examined duodenum was normal. Impression: Normal esophagus.  Normal stomach. Normal examined duodenum. No specimens collected.  MR ABDOMEN MRCP W WO CONTAST (01/28/2017) FINDINGS: Lower chest: No acute findings. Hepatobiliary: No masses identified. Diffuse hepatic steatosis noted. Multiple tiny gallstones are seen, without signs of acute cholecystitis. There is no evidence of biliary ductal dilatation, with common bile duct measuring 5 mm. No evidence of choledocholithiasis. Pancreas: No mass or inflammatory changes. No evidence of pancreatic ductal dilatation. Spleen:  Within normal limits in size and appearance. Adrenals/Urinary Tract: No masses identified. No evidence of hydronephrosis. Stomach/Bowel: Visualized portions within the abdomen are unremarkable. Vascular/Lymphatic: No pathologically enlarged lymph nodes identified. No abdominal aortic aneurysm. Other:  None. Musculoskeletal:  No suspicious bone lesions identified.  IMPRESSION: Cholelithiasis, without radiographic evidence of cholecystitis. No evidence of biliary ductal dilatation, choledocholithiasis, or other acute findings. Hepatic steatosis.  CT ABDOMEN PELVIS W CONTRAST (01/28/2017) FINDINGS: Lower chest: Lung bases clear Hepatobiliary: Diffuse fatty infiltration of liver. Gallbladder and liver otherwise unremarkable. Pancreas: Normal appearance Spleen: Normal appearance Adrenals/Urinary Tract: Adrenal glands normal appearance. Question tiny calculi versus early excretion of contrast material in the renal collecting systems. No hydronephrosis or ureteral dilatation. No evidence of renal mass. Bladder unremarkable. Stomach/Bowel: Normal appendix. Scattered respiratory motion artifacts. Stomach and bowel loops otherwise grossly unremarkable. Vascular/Lymphatic: Normal appearance Reproductive: Uterus and adnexa normal appearance Other: No definite free air or free fluid. Small umbilical hernia containing fat. No inflammatory process identified. Musculoskeletal: Osseous structures  unremarkable.  IMPRESSION: Fatty infiltration of liver. Question tiny nonobstructing renal calculi versus early excretion of contrast material into the renal collecting systems. Small umbilical hernia containing fat. No definite acute intra-abdominal or intrapelvic abnormalities.  Wendee Beavers, DO 01/30/2017, 7:21 AM PGY-1, [facility] FPTS Intern pager: [phone], text pages welcome

## 2017-01-30 NOTE — Progress Notes (Signed)
[facility] Progress Note  1 Day Post-Op  Subjective: CC: epigastric abdominal pain  Abdominal pain and nausea improved today. We discussed results of EGD and MRCP. Patient is agreeable to sugery. Requesting for arabic translator to go over details of surgery.   Objective: Vital signs in last 24 hours: Temp:  [98.3 F (36.8 C)-98.4 F (36.9 C)] 98.3 F (36.8 C) (04/30 0503) Pulse Rate:  [89-113] 92 (04/30 0503) Resp:  [16-18] 18 (04/30 0503) BP: (98-135)/(63-92) 108/63 (04/30 0503) SpO2:  [97 %-100 %] 100 % (04/30 0503) Last BM Date: 01/27/17  Intake/Output from previous day: No intake/output data recorded. Intake/Output this shift: No intake/output data recorded.  PE: Gen:  Alert, NAD, pleasant Card:  Regular rate and rhythm, pedal pulses 2+ BL Pulm:  Normal effort, clear to auscultation bilaterally Abd: Soft, non-tender, non-distended, bowel sounds present in all 4 quadrants Skin: warm and dry, no rashes  Psych: A&Ox3   Lab Results:   Recent Labs  01/29/17 0551 01/30/17 0428  WBC 6.5 5.7  HGB 9.7* 9.3*  HCT 32.9* 30.5*  PLT 421* 385   BMET  Recent Labs  01/29/17 0551 01/30/17 0428  NA 140 139  K 4.0 3.8  CL 108 107  CO2 20* 24  GLUCOSE 92 120*  BUN 6 11  CREATININE 0.63 0.60  CALCIUM 9.1 9.1   PT/INR No results for input(s): LABPROT, INR in the last 72 hours. CMP     Component Value Date/Time   NA 139 01/30/2017 0428   K 3.8 01/30/2017 0428   CL 107 01/30/2017 0428   CO2 24 01/30/2017 0428   GLUCOSE 120 (H) 01/30/2017 0428   GLUCOSE 80 09/10/2014 1115   BUN 11 01/30/2017 0428   CREATININE 0.60 01/30/2017 0428   CREATININE 0.56 12/08/2015 0942   CALCIUM 9.1 01/30/2017 0428   PROT 7.9 01/29/2017 0551   ALBUMIN 3.4 (L) 01/29/2017 0551   AST 240 (H) 01/29/2017 0551   ALT 364 (H) 01/29/2017 0551   ALKPHOS 146 (H) 01/29/2017 0551   BILITOT 1.0 01/29/2017 0551   GFRNONAA >60 01/30/2017 0428   GFRAA >60 01/30/2017 0428   Lipase      Component Value Date/Time   LIPASE 27 01/26/2017 1707   Studies/Results: Mr 3d Recon At Scanner  Result Date: 01/29/2017 CLINICAL DATA:  Epigastric pain and nausea with vomiting for 4 days. Cholelithiasis. Hepatic steatosis. EXAM: MRI ABDOMEN WITHOUT AND WITH CONTRAST TECHNIQUE: Multiplanar multisequence MR imaging of the abdomen was performed both before and after the administration of intravenous contrast. CONTRAST:  66m MULTIHANCE GADOBENATE DIMEGLUMINE 529 MG/ML IV SOLN COMPARISON:  CT on 01/27/2017 FINDINGS: Lower chest: No acute findings. Hepatobiliary: No masses identified. Diffuse hepatic steatosis noted. Multiple tiny gallstones are seen, without signs of acute cholecystitis. There is no evidence of biliary ductal dilatation, with common bile duct measuring 5 mm. No evidence of choledocholithiasis. Pancreas: No mass or inflammatory changes. No evidence of pancreatic ductal dilatation. Spleen:  Within normal limits in size and appearance. Adrenals/Urinary Tract: No masses identified. No evidence of hydronephrosis. Stomach/Bowel: Visualized portions within the abdomen are unremarkable. Vascular/Lymphatic: No pathologically enlarged lymph nodes identified. No abdominal aortic aneurysm. Other:  None. Musculoskeletal:  No suspicious bone lesions identified. IMPRESSION: Cholelithiasis, without radiographic evidence of cholecystitis. No evidence of biliary ductal dilatation, choledocholithiasis, or other acute findings. Hepatic steatosis. Electronically Signed   By: JEarle GellM.D.   On: 01/29/2017 07:37   Mr Abdomen Mrcp WMoise BoringContast  Result Date: 01/29/2017  CLINICAL DATA:  Epigastric pain and nausea with vomiting for 4 days. Cholelithiasis. Hepatic steatosis. EXAM: MRI ABDOMEN WITHOUT AND WITH CONTRAST TECHNIQUE: Multiplanar multisequence MR imaging of the abdomen was performed both before and after the administration of intravenous contrast. CONTRAST:  82m MULTIHANCE GADOBENATE DIMEGLUMINE  529 MG/ML IV SOLN COMPARISON:  CT on 01/27/2017 FINDINGS: Lower chest: No acute findings. Hepatobiliary: No masses identified. Diffuse hepatic steatosis noted. Multiple tiny gallstones are seen, without signs of acute cholecystitis. There is no evidence of biliary ductal dilatation, with common bile duct measuring 5 mm. No evidence of choledocholithiasis. Pancreas: No mass or inflammatory changes. No evidence of pancreatic ductal dilatation. Spleen:  Within normal limits in size and appearance. Adrenals/Urinary Tract: No masses identified. No evidence of hydronephrosis. Stomach/Bowel: Visualized portions within the abdomen are unremarkable. Vascular/Lymphatic: No pathologically enlarged lymph nodes identified. No abdominal aortic aneurysm. Other:  None. Musculoskeletal:  No suspicious bone lesions identified. IMPRESSION: Cholelithiasis, without radiographic evidence of cholecystitis. No evidence of biliary ductal dilatation, choledocholithiasis, or other acute findings. Hepatic steatosis. Electronically Signed   By: JEarle GellM.D.   On: 01/29/2017 07:37   Anti-infectives: Anti-infectives    None     Assessment/Plan Symptomatic cholelithiasis/biliary colic  - abdominal pain worse after meals  - MRCP negative for biliary obstruction or signs of cholecystitis, EGD 4/29 negative for CBD or gastritis  - symptoms consistent with symptomatic cholelithiasis; will make patient NPO after MN for probable cholecystectomy tomorrow by Dr. TGeorgette Dover   Transaminitis -  AST 240, ALT 364, Alk Phos 146; Hep panel negative;  Nausea/vomiting  Anemia - hgb 9.3/hct 30.5; on menstrual cycle, normal EGD 4/29 by Dr. HBenson Norway FEN- heart healthy diet ID- none VTE- SCD's   LOS: 4 days    EJill Alexanders, PMidwest Eye Surgery CenterSurgery 01/30/2017, 8:32 AM Pager: [phone]: [phone]-Fri 7:00 am-4:30 pm Sat-Sun 7:00 am-11:30 am

## 2017-01-30 NOTE — Progress Notes (Signed)
Used Status video interpreting.

## 2017-01-30 NOTE — Progress Notes (Signed)
Daily Rounding Note  01/30/2017, 9:51 AM  LOS: 4 days   SUBJECTIVE:   Chief complaint:     Pain improved.  No N/V.  Tolerating solid diet  OBJECTIVE:         Vital signs in last 24 hours:    Temp:  [98.3 F (36.8 C)-98.4 F (36.9 C)] 98.3 F (36.8 C) (04/30 0503) Pulse Rate:  [89-113] 92 (04/30 0503) Resp:  [16-18] 18 (04/30 0503) BP: (98-135)/(63-92) 108/63 (04/30 0503) SpO2:  [97 %-100 %] 100 % (04/30 0503) Last BM Date: 01/27/17 Filed Weights   01/26/17 1620  Weight: 80.1 kg (176 lb 9.6 oz)   General: looks well   Heart: RRR Chest: clear bil.  No sob or cough Abdomen: soft, active BS. Nd, NT.    Extremities: no CCE Neuro/Psych:  Pleasant, calm.  Alert and oriented x 3.    Intake/Output from previous day: No intake/output data recorded.  Intake/Output this shift: No intake/output data recorded.  Lab Results:  Recent Labs  01/28/17 2000 01/29/17 0551 01/30/17 0428  WBC 5.4 6.5 5.7  HGB 10.1* 9.7* 9.3*  HCT 33.3* 32.9* 30.5*  PLT 426* 421* 385   BMET  Recent Labs  01/28/17 0453 01/29/17 0551 01/30/17 0428  NA 137 140 139  K 4.3 4.0 3.8  CL 107 108 107  CO2 23 20* 24  GLUCOSE 103* 92 120*  BUN CREATININE 0.62 0.63 0.60  CALCIUM 8.7* 9.1 9.1   LFT  Recent Labs  01/29/17 0551  PROT 7.9  ALBUMIN 3.4*  AST 240*  ALT 364*  ALKPHOS 146*  BILITOT 1.0   PT/INR No results for input(s): LABPROT, INR in the last 72 hours. Hepatitis Panel No results for input(s): HEPBSAG, HCVAB, HEPAIGM, HEPBIGM in the last 72 hours.  Studies/Results: Mr 3d Recon At Scanner  Result Date: 01/29/2017 CLINICAL DATA:  Epigastric pain and nausea with vomiting for 4 days. Cholelithiasis. Hepatic steatosis. EXAM: MRI ABDOMEN WITHOUT AND WITH CONTRAST TECHNIQUE: Multiplanar multisequence MR imaging of the abdomen was performed both before and after the administration of intravenous contrast. CONTRAST:   18mL MULTIHANCE GADOBENATE DIMEGLUMINE 529 MG/ML IV SOLN COMPARISON:  CT on 01/27/2017 FINDINGS: Lower chest: No acute findings. Hepatobiliary: No masses identified. Diffuse hepatic steatosis noted. Multiple tiny gallstones are seen, without signs of acute cholecystitis. There is no evidence of biliary ductal dilatation, with common bile duct measuring 5 mm. No evidence of choledocholithiasis. Pancreas: No mass or inflammatory changes. No evidence of pancreatic ductal dilatation. Spleen:  Within normal limits in size and appearance. Adrenals/Urinary Tract: No masses identified. No evidence of hydronephrosis. Stomach/Bowel: Visualized portions within the abdomen are unremarkable. Vascular/Lymphatic: No pathologically enlarged lymph nodes identified. No abdominal aortic aneurysm. Other:  None. Musculoskeletal:  No suspicious bone lesions identified. IMPRESSION: Cholelithiasis, without radiographic evidence of cholecystitis. No evidence of biliary ductal dilatation, choledocholithiasis, or other acute findings. Hepatic steatosis. Electronically Signed   By: Myles Rosenthal M.D.   On: 01/29/2017 07:37   Mr Abdomen Mrcp Vivien Rossetti Contast  Result Date: 01/29/2017 CLINICAL DATA:  Epigastric pain and nausea with vomiting for 4 days. Cholelithiasis. Hepatic steatosis. EXAM: MRI ABDOMEN WITHOUT AND WITH CONTRAST TECHNIQUE: Multiplanar multisequence MR imaging of the abdomen was performed both before and after the administration of intravenous contrast. CONTRAST:  18mL MULTIHANCE GADOBENATE DIMEGLUMINE 529 MG/ML IV SOLN COMPARISON:  CT on 01/27/2017 FINDINGS: Lower chest: No acute findings. Hepatobiliary: No masses identified.  Diffuse hepatic steatosis noted. Multiple tiny gallstones are seen, without signs of acute cholecystitis. There is no evidence of biliary ductal dilatation, with common bile duct measuring 5 mm. No evidence of choledocholithiasis. Pancreas: No mass or inflammatory changes. No evidence of pancreatic ductal  dilatation. Spleen:  Within normal limits in size and appearance. Adrenals/Urinary Tract: No masses identified. No evidence of hydronephrosis. Stomach/Bowel: Visualized portions within the abdomen are unremarkable. Vascular/Lymphatic: No pathologically enlarged lymph nodes identified. No abdominal aortic aneurysm. Other:  None. Musculoskeletal:  No suspicious bone lesions identified. IMPRESSION: Cholelithiasis, without radiographic evidence of cholecystitis. No evidence of biliary ductal dilatation, choledocholithiasis, or other acute findings. Hepatic steatosis. Electronically Signed   By: Myles Rosenthal M.D.   On: 01/29/2017 07:37    ASSESMENT:   *  Transaminitis, epigastric pain.  ? All/only due to gallstones.  Viral serologies negative.    MRCP: normal biliary tree, cholelithiasis.   CT: fatty liver. Small umbilical hernia.    *  Iron def anemia.  No FOBT testing to date.  4/29 EGD:  Normal.    Goes through 30 pads over the course of her monthly 5 to 7 day periods.  Heavy bleeding for up to 3 days.      PLAN   *  ANA, IgG, EBV just to make sure there is not AIH or other viral source for LFTs.  *  Consider parenteral iron infusion prior to discharge and po iron at discharge.  If anemia persists, would refer to ob/gyn for wup/mgt of menorrhagia  *  Dr Corliss Skains plans lap chole tomorrow.     Jennye Moccasin  01/30/2017, 9:51 AM Pager: [phone]

## 2017-01-31 ENCOUNTER — Encounter (HOSPITAL_COMMUNITY): Payer: Self-pay | Admitting: Anesthesiology

## 2017-01-31 ENCOUNTER — Encounter (HOSPITAL_COMMUNITY)
Admission: AD | Disposition: A | Discharge status: Home / Self Care | Payer: Self-pay | Source: Ambulatory Visit | Attending: Family Medicine

## 2017-01-31 ENCOUNTER — Inpatient Hospital Stay (HOSPITAL_COMMUNITY): Payer: BLUE CROSS/BLUE SHIELD | Admitting: Anesthesiology

## 2017-01-31 ENCOUNTER — Inpatient Hospital Stay (HOSPITAL_COMMUNITY): Payer: BLUE CROSS/BLUE SHIELD

## 2017-01-31 DIAGNOSIS — K8050 Calculus of bile duct without cholangitis or cholecystitis without obstruction: Secondary | ICD-10-CM

## 2017-01-31 DIAGNOSIS — Z419 Encounter for procedure for purposes other than remedying health state, unspecified: Secondary | ICD-10-CM

## 2017-01-31 HISTORY — PX: CHOLECYSTECTOMY: SHX55

## 2017-01-31 LAB — BASIC METABOLIC PANEL
Anion gap: 8 (ref 5–15)
BUN: 7 mg/dL (ref 6–20)
CO2: 23 mmol/L (ref 22–32)
Calcium: 9 mg/dL (ref 8.9–10.3)
Chloride: 108 mmol/L (ref 101–111)
Creatinine, Ser: 0.58 mg/dL (ref 0.44–1.00)
GFR calc Af Amer: >60 mL/min (ref >60)
GFR calc non Af Amer: >60 mL/min (ref >60)
Glucose, Bld: 114 mg/dL — ABNORMAL HIGH (ref 65–99)
Potassium: 3.7 mmol/L (ref 3.5–5.1)
Sodium: 139 mmol/L (ref 135–145)

## 2017-01-31 LAB — CBC
HCT: 30.8 % — ABNORMAL LOW (ref 36.0–46.0)
HCT: 32.2 % — ABNORMAL LOW (ref 36.0–46.0)
Hemoglobin: 9.2 g/dL — ABNORMAL LOW (ref 12.0–15.0)
Hemoglobin: 10 g/dL — ABNORMAL LOW (ref 12.0–15.0)
MCH: 19.3 pg — ABNORMAL LOW (ref 26.0–34.0)
MCH: 20 pg — ABNORMAL LOW (ref 26.0–34.0)
MCHC: 29.9 g/dL — ABNORMAL LOW (ref 30.0–36.0)
MCHC: 31.1 g/dL (ref 30.0–36.0)
MCV: 64.6 fL — ABNORMAL LOW (ref 78.0–100.0)
MCV: 64.3 fL — ABNORMAL LOW (ref 78.0–100.0)
Platelets: 375 10*3/uL (ref 150–400)
Platelets: 315 10*3/uL (ref 150–400)
RBC: 4.77 MIL/uL (ref 3.87–5.11)
RBC: 5.01 MIL/uL (ref 3.87–5.11)
RDW: 17.7 % — ABNORMAL HIGH (ref 11.5–15.5)
RDW: 18.2 % — ABNORMAL HIGH (ref 11.5–15.5)
WBC: 5.2 10*3/uL (ref 4.0–10.5)
WBC: 6.3 10*3/uL (ref 4.0–10.5)

## 2017-01-31 LAB — HEPATIC FUNCTION PANEL
ALT: 180 U/L — ABNORMAL HIGH (ref 14–54)
AST: 63 U/L — ABNORMAL HIGH (ref 15–41)
Albumin: 3.2 g/dL — ABNORMAL LOW (ref 3.5–5.0)
Alkaline Phosphatase: 105 U/L (ref 38–126)
Bilirubin, Direct: 0.1 mg/dL (ref 0.1–0.5)
Indirect Bilirubin: 0.5 mg/dL (ref 0.3–0.9)
Total Bilirubin: 0.6 mg/dL (ref 0.3–1.2)
Total Protein: 7.3 g/dL (ref 6.5–8.1)

## 2017-01-31 LAB — SURGICAL PCR SCREEN
MRSA, PCR: NEGATIVE
Staphylococcus aureus: NEGATIVE

## 2017-01-31 LAB — RETICULOCYTES
RBC.: 5.09 MIL/uL (ref 3.87–5.11)
Retic Count, Absolute: 71.3 10*3/uL (ref 19.0–186.0)
Retic Ct Pct: 1.4 % (ref 0.4–3.1)

## 2017-01-31 LAB — EPSTEIN-BARR VIRUS VCA, IGM: EBV VCA IgM: <36 U/mL (ref 0.0–35.9)

## 2017-01-31 LAB — IRON AND TIBC
Iron: <5 ug/dL — ABNORMAL LOW (ref 28–170)
TIBC: 461 ug/dL — ABNORMAL HIGH (ref 250–450)

## 2017-01-31 LAB — ANTI-SMOOTH MUSCLE ANTIBODY, IGG: F-Actin IgG: 16 Units (ref 0–19)

## 2017-01-31 LAB — CREATININE, SERUM
Creatinine, Ser: 0.62 mg/dL (ref 0.44–1.00)
GFR calc Af Amer: >60 mL/min (ref >60)
GFR calc non Af Amer: >60 mL/min (ref >60)

## 2017-01-31 LAB — FERRITIN: Ferritin: 233 ng/mL (ref 11–307)

## 2017-01-31 LAB — FOLATE: Folate: 27.1 ng/mL (ref >5.9)

## 2017-01-31 LAB — ANTINUCLEAR ANTIBODIES, IFA: ANA Ab, IFA: NEGATIVE

## 2017-01-31 LAB — HEPATITIS B SURFACE ANTIBODY,QUALITATIVE: Hep B S Ab: NONREACTIVE

## 2017-01-31 LAB — IGG: IgG (Immunoglobin G), Serum: 1130 mg/dL (ref 700–1600)

## 2017-01-31 LAB — VITAMIN B12: Vitamin B-12: 739 pg/mL (ref 180–914)

## 2017-01-31 SURGERY — LAPAROSCOPIC CHOLECYSTECTOMY WITH INTRAOPERATIVE CHOLANGIOGRAM
Anesthesia: General | Site: Abdomen

## 2017-01-31 MED ORDER — ROCURONIUM BROMIDE 100 MG/10ML IV SOLN
INTRAVENOUS | Status: DC | PRN
  Administered 2017-01-31: 50 mg via INTRAVENOUS

## 2017-01-31 MED ORDER — HYDROMORPHONE HCL 1 MG/ML IJ SOLN
INTRAMUSCULAR | Status: AC
  Administered 2017-01-31: 0.5 mg via INTRAVENOUS
  Filled 2017-01-31: qty 0.5

## 2017-01-31 MED ORDER — FENTANYL CITRATE (PF) 250 MCG/5ML IJ SOLN
INTRAMUSCULAR | Status: AC
  Filled 2017-01-31: qty 5

## 2017-01-31 MED ORDER — DIPHENHYDRAMINE HCL 25 MG PO CAPS: 25.0000 mg | ORAL_CAPSULE | Freq: Four times a day (QID) | ORAL | Status: DC | PRN

## 2017-01-31 MED ORDER — LACTATED RINGERS IV SOLN
INTRAVENOUS | Status: DC
  Administered 2017-01-31: 10:00:00 via INTRAVENOUS

## 2017-01-31 MED ORDER — OXYCODONE HCL 5 MG PO TABS
5.0000 mg | ORAL_TABLET | ORAL | Status: DC | PRN
  Administered 2017-02-01 – 2017-02-02 (×4): 10 mg via ORAL
  Filled 2017-01-31 (×4): qty 2

## 2017-01-31 MED ORDER — SODIUM CHLORIDE 0.9 % IV SOLN
INTRAVENOUS | Status: DC
  Administered 2017-01-31 – 2017-02-01 (×3): via INTRAVENOUS

## 2017-01-31 MED ORDER — BUPIVACAINE-EPINEPHRINE 0.25% -1:200000 IJ SOLN: INTRAMUSCULAR | Status: DC | PRN

## 2017-01-31 MED ORDER — ESMOLOL HCL 100 MG/10ML IV SOLN
INTRAVENOUS | Status: DC | PRN
  Administered 2017-01-31: 10 mg via INTRAVENOUS
  Administered 2017-01-31: 20 mg via INTRAVENOUS

## 2017-01-31 MED ORDER — FENTANYL CITRATE (PF) 100 MCG/2ML IJ SOLN
INTRAMUSCULAR | Status: DC | PRN
  Administered 2017-01-31: 100 ug via INTRAVENOUS
  Administered 2017-01-31 (×2): 50 ug via INTRAVENOUS
  Administered 2017-01-31: 100 ug via INTRAVENOUS
  Administered 2017-01-31 (×4): 50 ug via INTRAVENOUS

## 2017-01-31 MED ORDER — MORPHINE SULFATE (PF) 2 MG/ML IV SOLN: 2.0000 mg | INTRAVENOUS | Status: DC | PRN

## 2017-01-31 MED ORDER — LIDOCAINE 2% (20 MG/ML) 5 ML SYRINGE
INTRAMUSCULAR | Status: AC
  Filled 2017-01-31: qty 5

## 2017-01-31 MED ORDER — ONDANSETRON HCL 4 MG/2ML IJ SOLN
INTRAMUSCULAR | Status: DC | PRN
  Administered 2017-01-31: 4 mg via INTRAVENOUS

## 2017-01-31 MED ORDER — MORPHINE SULFATE (PF) 4 MG/ML IV SOLN
2.0000 mg | INTRAVENOUS | Status: DC | PRN
  Administered 2017-01-31 (×2): 4 mg via INTRAVENOUS
  Filled 2017-01-31 (×2): qty 1

## 2017-01-31 MED ORDER — PROMETHAZINE HCL 25 MG/ML IJ SOLN: 6.2500 mg | INTRAMUSCULAR | Status: DC | PRN

## 2017-01-31 MED ORDER — BUPIVACAINE HCL (PF) 0.25 % IJ SOLN
INTRAMUSCULAR | Status: AC
  Filled 2017-01-31: qty 30

## 2017-01-31 MED ORDER — BUPIVACAINE HCL (PF) 0.25 % IJ SOLN
INTRAMUSCULAR | Status: DC | PRN
  Administered 2017-01-31: 30 mL

## 2017-01-31 MED ORDER — DIPHENHYDRAMINE HCL 50 MG/ML IJ SOLN: 25.0000 mg | Freq: Four times a day (QID) | INTRAMUSCULAR | Status: DC | PRN

## 2017-01-31 MED ORDER — SUGAMMADEX SODIUM 200 MG/2ML IV SOLN
INTRAVENOUS | Status: DC | PRN
  Administered 2017-01-31: 200 mg via INTRAVENOUS

## 2017-01-31 MED ORDER — ONDANSETRON HCL 4 MG/2ML IJ SOLN
INTRAMUSCULAR | Status: AC
  Filled 2017-01-31: qty 2

## 2017-01-31 MED ORDER — MIDAZOLAM HCL 2 MG/2ML IJ SOLN
INTRAMUSCULAR | Status: AC
  Filled 2017-01-31: qty 2

## 2017-01-31 MED ORDER — PROPOFOL 10 MG/ML IV BOLUS
INTRAVENOUS | Status: AC
  Filled 2017-01-31: qty 20

## 2017-01-31 MED ORDER — ACETAMINOPHEN 650 MG RE SUPP: 650.0000 mg | Freq: Four times a day (QID) | RECTAL | Status: DC | PRN

## 2017-01-31 MED ORDER — SODIUM CHLORIDE 0.9 % IR SOLN
Status: DC | PRN
  Administered 2017-01-31: 1

## 2017-01-31 MED ORDER — ACETAMINOPHEN 325 MG PO TABS
650.0000 mg | ORAL_TABLET | Freq: Four times a day (QID) | ORAL | Status: DC | PRN
  Administered 2017-02-01 (×2): 650 mg via ORAL
  Filled 2017-01-31 (×3): qty 2

## 2017-01-31 MED ORDER — IOPAMIDOL (ISOVUE-300) INJECTION 61%
INTRAVENOUS | Status: AC
  Filled 2017-01-31: qty 50

## 2017-01-31 MED ORDER — CEFAZOLIN SODIUM-DEXTROSE 2-4 GM/100ML-% IV SOLN
2.0000 g | Freq: Three times a day (TID) | INTRAVENOUS | Status: AC
  Administered 2017-01-31: 2 g via INTRAVENOUS
  Filled 2017-01-31: qty 100

## 2017-01-31 MED ORDER — PROPOFOL 10 MG/ML IV BOLUS
INTRAVENOUS | Status: DC | PRN
  Administered 2017-01-31: 200 mg via INTRAVENOUS

## 2017-01-31 MED ORDER — MIDAZOLAM HCL 5 MG/5ML IJ SOLN
INTRAMUSCULAR | Status: DC | PRN
  Administered 2017-01-31: 2 mg via INTRAVENOUS

## 2017-01-31 MED ORDER — HYDROMORPHONE HCL 1 MG/ML IJ SOLN
0.2500 mg | INTRAMUSCULAR | Status: DC | PRN
  Administered 2017-01-31: 0.5 mg via INTRAVENOUS

## 2017-01-31 MED ORDER — HEMOSTATIC AGENTS (NO CHARGE) OPTIME
TOPICAL | Status: DC | PRN
  Administered 2017-01-31: 1 via TOPICAL

## 2017-01-31 MED ORDER — ESMOLOL HCL 100 MG/10ML IV SOLN
INTRAVENOUS | Status: AC
  Filled 2017-01-31: qty 10

## 2017-01-31 MED ORDER — 0.9 % SODIUM CHLORIDE (POUR BTL) OPTIME
TOPICAL | Status: DC | PRN
  Administered 2017-01-31: 1000 mL

## 2017-01-31 MED ORDER — ENOXAPARIN SODIUM 40 MG/0.4ML ~~LOC~~ SOLN
40.0000 mg | SUBCUTANEOUS | Status: DC
  Administered 2017-02-01 – 2017-02-02 (×2): 40 mg via SUBCUTANEOUS
  Filled 2017-01-31 (×2): qty 0.4

## 2017-01-31 MED ORDER — SODIUM CHLORIDE 0.9 % IV SOLN
INTRAVENOUS | Status: DC | PRN
  Administered 2017-01-31: 100 mL

## 2017-01-31 SURGICAL SUPPLY — 44 items
APPLIER CLIP ROT 10 11.4 M/L (STAPLE) ×2
BENZOIN TINCTURE PRP APPL 2/3 (GAUZE/BANDAGES/DRESSINGS) ×2 IMPLANT
BLADE CLIPPER SURG (BLADE) IMPLANT
CANISTER SUCT 3000ML PPV (MISCELLANEOUS) ×2 IMPLANT
CHLORAPREP W/TINT 26ML (MISCELLANEOUS) ×2 IMPLANT
CLIP APPLIE ROT 10 11.4 M/L (STAPLE) ×1 IMPLANT
COVER MAYO STAND STRL (DRAPES) ×2 IMPLANT
COVER SURGICAL LIGHT HANDLE (MISCELLANEOUS) ×2 IMPLANT
DRAPE C-ARM 42X72 X-RAY (DRAPES) ×2 IMPLANT
DRSG TEGADERM 2-3/8X2-3/4 SM (GAUZE/BANDAGES/DRESSINGS) ×6 IMPLANT
DRSG TEGADERM 4X4.75 (GAUZE/BANDAGES/DRESSINGS) ×2 IMPLANT
ELECT REM PT RETURN 9FT ADLT (ELECTROSURGICAL) ×2
ELECTRODE REM PT RTRN 9FT ADLT (ELECTROSURGICAL) ×1 IMPLANT
FILTER SMOKE EVAC LAPAROSHD (FILTER) ×2 IMPLANT
GAUZE SPONGE 2X2 8PLY STRL LF (GAUZE/BANDAGES/DRESSINGS) ×1 IMPLANT
GLOVE BIO SURGEON STRL SZ7 (GLOVE) ×2 IMPLANT
GLOVE BIOGEL PI IND STRL 7.0 (GLOVE) ×2 IMPLANT
GLOVE BIOGEL PI IND STRL 7.5 (GLOVE) ×1 IMPLANT
GLOVE BIOGEL PI INDICATOR 7.0 (GLOVE) ×2
GLOVE BIOGEL PI INDICATOR 7.5 (GLOVE) ×1
GLOVE SURG SS PI 7.0 STRL IVOR (GLOVE) ×4 IMPLANT
GOWN STRL REUS W/ TWL LRG LVL3 (GOWN DISPOSABLE) ×3 IMPLANT
GOWN STRL REUS W/TWL LRG LVL3 (GOWN DISPOSABLE) ×3
HEMOSTAT SNOW SURGICEL 2X4 (HEMOSTASIS) ×2 IMPLANT
KIT BASIN OR (CUSTOM PROCEDURE TRAY) ×2 IMPLANT
KIT ROOM TURNOVER OR (KITS) ×2 IMPLANT
NS IRRIG 1000ML POUR BTL (IV SOLUTION) ×2 IMPLANT
PAD ARMBOARD 7.5X6 YLW CONV (MISCELLANEOUS) ×2 IMPLANT
POUCH SPECIMEN RETRIEVAL 10MM (ENDOMECHANICALS) ×2 IMPLANT
SCISSORS LAP 5X35 DISP (ENDOMECHANICALS) ×2 IMPLANT
SET CHOLANGIOGRAPH 5 50 .035 (SET/KITS/TRAYS/PACK) ×2 IMPLANT
SET IRRIG TUBING LAPAROSCOPIC (IRRIGATION / IRRIGATOR) ×2 IMPLANT
SLEEVE ENDOPATH XCEL 5M (ENDOMECHANICALS) ×2 IMPLANT
SPECIMEN JAR SMALL (MISCELLANEOUS) ×2 IMPLANT
SPONGE GAUZE 2X2 STER 10/PKG (GAUZE/BANDAGES/DRESSINGS) ×1
STRIP CLOSURE SKIN 1/2X4 (GAUZE/BANDAGES/DRESSINGS) ×2 IMPLANT
SUT MNCRL AB 4-0 PS2 18 (SUTURE) ×2 IMPLANT
TOWEL OR 17X24 6PK STRL BLUE (TOWEL DISPOSABLE) ×2 IMPLANT
TOWEL OR 17X26 10 PK STRL BLUE (TOWEL DISPOSABLE) ×2 IMPLANT
TRAY LAPAROSCOPIC MC (CUSTOM PROCEDURE TRAY) ×2 IMPLANT
TROCAR XCEL BLUNT TIP 100MML (ENDOMECHANICALS) ×2 IMPLANT
TROCAR XCEL NON-BLD 11X100MML (ENDOMECHANICALS) ×2 IMPLANT
TROCAR XCEL NON-BLD 5MMX100MML (ENDOMECHANICALS) ×2 IMPLANT
TUBING INSUFFLATION (TUBING) ×2 IMPLANT

## 2017-01-31 NOTE — Progress Notes (Signed)
Family Medicine Teaching Service Daily Progress Note Intern Pager: [phone]  Patient name: [name] Medical record number: [id number] Date of birth: Nov 14, 1985 Age: 31 y.o. Gender: female  Primary Care Provider: Renold Don, MD Consultants: Gastroenterology, General Surgery Code Status: Full  Pt Overview and Major Events to Date:   04/26: Admit for epigastric pain and vomiting  04/29: GI determined symptomatic gall stones, otherwise neg EGD/MRCP, Gen surgery consulted   Assessment and Plan: [name] is a 31 y.o. female presenting with abd pain, N/V. No significant PMH.   #Epigastric pain, gallstones with transaminitis:  Tolerating regular diet without nausea.  NPO at MN.  General surgery to perform laparoscopic cholecystectomy today.  --GI following, appreciate recommendations: ANA, IgG, EBV just to make sure there is not AIH or other viral source for LFTs --Follow up surgery recs post-op  --Zofran prn for nausea/vomiting  --Protonix 40 mg daily  --Hepatitis B sAb, since she has elevated liver enzymes will need hep B vaccination   #Iron deficiency anemia: Likely acute on chronic. Hemoglobin stable at baseline (9.5-10).  Appears value of 7.5 4/28 was spurious. Iron studies support this finding. FOBT+ however patient menstruating when checked so may be false.   -Will need to repeat FOBT once her menstrual cycle is over.   -GI recommends referral to OB/GYN for workup of menorrhagia if anemia persists after transfusion. --Daily CBC  - Anemia panel ordered to further evaluate  --Ferriheme infusion 4/30  FEN/GI: NPO  for laparoscopic cholecystectomy Prophylaxis: SCDs   Disposition: Lap chole today.  Dispo pending clinical improvement.   Subjective:  Patient states she does not have any pain this AM. Denies nausea or vomiting.  Going for surgery today.    Objective: Temp:  [97.3 F (36.3 C)-98.6 F (37 C)] 97.3 F (36.3 C) (05/01 1215) Pulse Rate:  [83-105] 105 (05/01  1215) Resp:  [10-18] 10 (05/01 1215) BP: (107-140)/(68-99) 136/99 (05/01 1215) SpO2:  [100 %] 100 % (05/01 1215)   Physical Exam: General: 31 yo F, NAD, laying in bed CV: RRR no MRG  Lungs: CTA B/L with comfortable WOB  Abdomen: soft, NTND, mild RUQ pain, +bs Skin: warm, dry  Laboratory:  Recent Labs Lab 01/29/17 0551 01/30/17 0428 01/31/17 0346  WBC 6.5 5.7 5.2  HGB 9.7* 9.3* 9.2*  HCT 32.9* 30.5* 30.8*  PLT 421* 385 375    Recent Labs Lab 01/27/17 0756  01/29/17 0551 01/30/17 0428 01/31/17 0346 01/31/17 0700  NA 140  < > 140 139 139  --   K 3.3*  < > 4.0 3.8 3.7  --   CL 108  < > 108 107 108  --   CO2 21*  < > 20* 24 23  --   BUN 9  < > --   CREATININE 0.64  < > 0.63 0.60 0.58  --   CALCIUM 8.7*  < > 9.1 9.1 9.0  --   PROT 7.7  --  7.9  --   --  7.3  BILITOT 1.0  --  1.0  --   --  0.6  ALKPHOS 142*  --  146*  --   --  105  ALT 335*  --  364*  --   --  180*  AST 223*  --  240*  --   --  63*  GLUCOSE 95  < > 92 120* 114*  --   < > = values in this interval not displayed. Mag: 2.1 (  WNL) Phos: 3.4 (WNL) Lipase: 27 (WNL) UA: Amber, hazy, large hgb, 20 ketones, 100 protein, RBC too numerous to ct, rare bacteria, 6-30 squams Preg: Negative FOBT: Pending Hepatitis panel: Negative Vitamin B-12: 727 (WNL) Folate: 47.7 (WNL) Iron panel: iron 21 (L), TIBC 456 (H), sat ratio 5 (L) Ferritin: 11 (WNL) Retic ct: 1.2 (WNL)  Imaging/Diagnostic Tests: ESOPHAGOGASTRODUODENOSCOPY (01/29/2017) Findings: The esophagus was normal. The stomach was normal. The examined duodenum was normal. Impression: Normal esophagus. Normal stomach. Normal examined duodenum. No specimens collected.  MR ABDOMEN MRCP W WO CONTAST (01/28/2017) FINDINGS: Lower chest: No acute findings. Hepatobiliary: No masses identified. Diffuse hepatic steatosis noted. Multiple tiny gallstones are seen, without signs of acute cholecystitis. There is no evidence of biliary ductal dilatation, with  common bile duct measuring 5 mm. No evidence of choledocholithiasis. Pancreas: No mass or inflammatory changes. No evidence of pancreatic ductal dilatation. Spleen:  Within normal limits in size and appearance. Adrenals/Urinary Tract: No masses identified. No evidence of hydronephrosis. Stomach/Bowel: Visualized portions within the abdomen are unremarkable. Vascular/Lymphatic: No pathologically enlarged lymph nodes identified. No abdominal aortic aneurysm. Other:  None. Musculoskeletal:  No suspicious bone lesions identified.  IMPRESSION: Cholelithiasis, without radiographic evidence of cholecystitis. No evidence of biliary ductal dilatation, choledocholithiasis, or other acute findings. Hepatic steatosis.  CT ABDOMEN PELVIS W CONTRAST (01/28/2017) FINDINGS: Lower chest: Lung bases clear Hepatobiliary: Diffuse fatty infiltration of liver. Gallbladder and liver otherwise unremarkable. Pancreas: Normal appearance Spleen: Normal appearance Adrenals/Urinary Tract: Adrenal glands normal appearance. Question tiny calculi versus early excretion of contrast material in the renal collecting systems. No hydronephrosis or ureteral dilatation. No evidence of renal mass. Bladder unremarkable. Stomach/Bowel: Normal appendix. Scattered respiratory motion artifacts. Stomach and bowel loops otherwise grossly unremarkable. Vascular/Lymphatic: Normal appearance Reproductive: Uterus and adnexa normal appearance Other: No definite free air or free fluid. Small umbilical hernia containing fat. No inflammatory process identified. Musculoskeletal: Osseous structures unremarkable.  IMPRESSION: Fatty infiltration of liver. Question tiny nonobstructing renal calculi versus early excretion of contrast material into the renal collecting systems. Small umbilical hernia containing fat. No definite acute intra-abdominal or intrapelvic abnormalities.  Freddrick March, MD 01/31/2017, 12:38 PM PGY-1, [facility] FPTS Intern pager: [phone], text pages welcome

## 2017-01-31 NOTE — Transfer of Care (Signed)
Immediate Anesthesia Transfer of Care Note  Patient: [name]  Procedure(s) Performed: Procedure(s): LAPAROSCOPIC CHOLECYSTECTOMY WITH INTRAOPERATIVE CHOLANGIOGRAM; REPAIR OF UMBILICAL HERNIA (N/A)  Patient Location: PACU  Anesthesia Type:General  Level of Consciousness: awake, alert , sedated, patient cooperative and responds to stimulation  Airway & Oxygen Therapy: Patient Spontanous Breathing and Patient connected to nasal cannula oxygen  Post-op Assessment: Report given to RN, Post -op Vital signs reviewed and stable and Patient moving all extremities  Post vital signs: Reviewed and stable  Last Vitals:  Vitals:   01/30/17 2110 01/31/17 0521  BP: 107/72 110/70  Pulse: 89 83  Resp: 18 16  Temp: 37 C 36.8 C    Last Pain:  Vitals:   01/30/17 2030  TempSrc:   PainSc: 0-No pain         Complications: No apparent anesthesia complications

## 2017-01-31 NOTE — Discharge Instructions (Signed)
Laparoscopic Cholecystectomy, Care After °This sheet gives you information about how to care for yourself after your procedure. Your health care provider may also give you more specific instructions. If you have problems or questions, contact your health care provider. °What can I expect after the procedure? °After the procedure, it is common to have: °· Pain at your incision sites. You will be given medicines to control this pain. °· Mild nausea or vomiting. °· Bloating and possible shoulder pain from the air-like gas that was used during the procedure. °Follow these instructions at home: °Incision care  ° °· Follow instructions from your health care provider about how to take care of your incisions. Make sure you: °¨ Wash your hands with soap and water before you change your bandage (dressing). If soap and water are not available, use hand sanitizer. °¨ Change your dressing as told by your health care provider. °¨ Leave stitches (sutures), skin glue, or adhesive strips in place. These skin closures may need to be in place for 2 weeks or longer. If adhesive strip edges start to loosen and curl up, you may trim the loose edges. Do not remove adhesive strips completely unless your health care provider tells you to do that. °· Do not take baths, swim, or use a hot tub until your health care provider approves. Ask your health care provider if you can take showers. You may only be allowed to take sponge baths for bathing. °· Check your incision area every day for signs of infection. Check for: °¨ More redness, swelling, or pain. °¨ More fluid or blood. °¨ Warmth. °¨ Pus or a bad smell. °Activity  °· Do not drive or use heavy machinery while taking prescription pain medicine. °· Do not lift anything that is heavier than 10 lb (4.5 kg) until your health care provider approves. °· Do not play contact sports until your health care provider approves. °· Do not drive for 24 hours if you were given a medicine to help you relax  (sedative). °· Rest as needed. Do not return to work or school until your health care provider approves. °General instructions  °· Take over-the-counter and prescription medicines only as told by your health care provider. °· To prevent or treat constipation while you are taking prescription pain medicine, your health care provider may recommend that you: °¨ Drink enough fluid to keep your urine clear or pale yellow. °¨ Take over-the-counter or prescription medicines. °¨ Eat foods that are high in fiber, such as fresh fruits and vegetables, whole grains, and beans. °¨ Limit foods that are high in fat and processed sugars, such as fried and sweet foods. °Contact a health care provider if: °· You develop a rash. °· You have more redness, swelling, or pain around your incisions. °· You have more fluid or blood coming from your incisions. °· Your incisions feel warm to the touch. °· You have pus or a bad smell coming from your incisions. °· You have a fever. °· One or more of your incisions breaks open. °Get help right away if: °· You have trouble breathing. °· You have chest pain. °· You have increasing pain in your shoulders. °· You faint or feel dizzy when you stand. °· You have severe pain in your abdomen. °· You have nausea or vomiting that lasts for more than one day. °· You have leg pain. °This information is not intended to replace advice given to you by your health care provider. Make sure you discuss any   questions you have with your health care provider. °Document Released: 09/19/2005 Document Revised: 04/09/2016 Document Reviewed: 03/07/2016 °Elsevier Interactive Patient Education © 2017 [employer] ° °CCS ______[facility] °LAPAROSCOPIC SURGERY: POST OP INSTRUCTIONS °Always review your discharge instruction sheet given to you by the facility where your surgery was performed. °IF YOU HAVE DISABILITY OR FAMILY LEAVE FORMS, YOU MUST BRING THEM TO THE OFFICE FOR PROCESSING.   °DO NOT GIVE  THEM TO YOUR DOCTOR. ° °1. A prescription for pain medication may be given to you upon discharge.  Take your pain medication as prescribed, if needed.  If narcotic pain medicine is not needed, then you may take acetaminophen (Tylenol) or ibuprofen (Advil) as needed. °2. Take your usually prescribed medications unless otherwise directed. °3. If you need a refill on your pain medication, please contact your pharmacy.  They will contact our office to request authorization. Prescriptions will not be filled after 5pm or on week-ends. °4. You should follow a light diet the first few days after arrival home, such as soup and crackers, etc.  Be sure to include lots of fluids daily. °5. Most patients will experience some swelling and bruising in the area of the incisions.  Ice packs will help.  Swelling and bruising can take several days to resolve.  °6. It is common to experience some constipation if taking pain medication after surgery.  Increasing fluid intake and taking a stool softener (such as Colace) will usually help or prevent this problem from occurring.  A mild laxative (Milk of Magnesia or Miralax) should be taken according to package instructions if there are no bowel movements after 48 hours. °7. Unless discharge instructions indicate otherwise, you may remove your bandages 24-48 hours after surgery, and you may shower at that time.  You may have steri-strips (small skin tapes) in place directly over the incision.  These strips should be left on the skin for 7-10 days.  If your surgeon used skin glue on the incision, you may shower in 24 hours.  The glue will flake off over the next 2-3 weeks.  Any sutures or staples will be removed at the office during your follow-up visit. °8. ACTIVITIES:  You may resume regular (light) daily activities beginning the next day--such as daily self-care, walking, climbing stairs--gradually increasing activities as tolerated.  You may have sexual intercourse when it is  comfortable.  Refrain from any heavy lifting or straining until approved by your doctor. °a. You may drive when you are no longer taking prescription pain medication, you can comfortably wear a seatbelt, and you can safely maneuver your car and apply brakes. °b. RETURN TO WORK:  __________________________________________________________ °9. You should see your doctor in the office for a follow-up appointment approximately 2-3 weeks after your surgery.  Make sure that you call for this appointment within a day or two after you arrive home to insure a convenient appointment time. °10. OTHER INSTRUCTIONS: __________________________________________________________________________________________________________________________ __________________________________________________________________________________________________________________________ °WHEN TO CALL YOUR DOCTOR: °1. Fever over 101.0 °2. Inability to urinate °3. Continued bleeding from incision. °4. Increased pain, redness, or drainage from the incision. °5. Increasing abdominal pain ° °The clinic staff is available to answer your questions during regular business hours.  Please don’t hesitate to call and ask to speak to one of the nurses for clinical concerns.  If you have a medical emergency, go to the nearest emergency room or call 911.  A surgeon from [facility] is always on call at the hospital. °[address], [address], [place], [place]  [address] ? [address] [address], [place], [place]   [address] °[phone] ? [phone] ? FAX [phone] °Web site: [email or web address] ° °

## 2017-01-31 NOTE — Anesthesia Preprocedure Evaluation (Signed)
Anesthesia Evaluation  Patient identified by MRN, date of birth, ID band Patient awake    Reviewed: Allergy & Precautions, NPO status , Patient's Chart, lab work & pertinent test results  Airway Mallampati: II  TM Distance: >3 FB Neck ROM: Full    Dental no notable dental hx. (+) Dental Advisory Given   Pulmonary neg pulmonary ROS,    Pulmonary exam normal        Cardiovascular negative cardio ROS Normal cardiovascular exam     Neuro/Psych negative neurological ROS  negative psych ROS   GI/Hepatic negative GI ROS, Neg liver ROS,   Endo/Other  negative endocrine ROS  Renal/GU negative Renal ROS  negative genitourinary   Musculoskeletal negative musculoskeletal ROS (+)   Abdominal   Peds negative pediatric ROS (+)  Hematology negative hematology ROS (+)   Anesthesia Other Findings   Reproductive/Obstetrics negative OB ROS                            Anesthesia Physical Anesthesia Plan  ASA: II  Anesthesia Plan: General   Post-op Pain Management:    Induction: Intravenous  Airway Management Planned: Oral ETT  Additional Equipment:   Intra-op Plan:   Post-operative Plan: Extubation in OR  Informed Consent: I have reviewed the patients History and Physical, chart, labs and discussed the procedure including the risks, benefits and alternatives for the proposed anesthesia with the patient or authorized representative who has indicated his/her understanding and acceptance.   Dental advisory given  Plan Discussed with: CRNA, Anesthesiologist and Surgeon  Anesthesia Plan Comments:         Anesthesia Quick Evaluation  

## 2017-01-31 NOTE — Anesthesia Procedure Notes (Signed)
Procedure Name: Intubation Date/Time: 01/31/2017 10:41 AM Performed by: Rebekah Chesterfield L Pre-anesthesia Checklist: Patient identified, Emergency Drugs available, Suction available and Patient being monitored Patient Re-evaluated:Patient Re-evaluated prior to inductionOxygen Delivery Method: Circle System Utilized Preoxygenation: Pre-oxygenation with 100% oxygen Intubation Type: IV induction Ventilation: Mask ventilation without difficulty Laryngoscope Size: Mac and 3 Grade View: Grade I Tube type: Oral Tube size: 7.5 mm Number of attempts: 1 Airway Equipment and Method: Stylet and Oral airway Placement Confirmation: ETT inserted through vocal cords under direct vision,  positive ETCO2 and breath sounds checked- equal and bilateral Secured at: 20 cm Tube secured with: Tape Dental Injury: Teeth and Oropharynx as per pre-operative assessment

## 2017-01-31 NOTE — Op Note (Signed)
Laparoscopic Cholecystectomy with IOC Procedure Note  Indications: This patient presents with symptomatic gallbladder disease and will undergo laparoscopic cholecystectomy.  MRCP confirmed gallstones but no sign of common bile duct obstruction.  T. Bilirubin was normal.  Transaminases are improving.  Pre-operative Diagnosis: Calculus of gallbladder with other cholecystitis, without mention of obstruction  Post-operative Diagnosis: Same  Surgeon: Etherine Mackowiak K.   Assistants: none  Anesthesia: General endotracheal anesthesia  ASA Class: 2  Procedure Details  The patient was seen again in the Holding Room. The risks, benefits, complications, treatment options, and expected outcomes were discussed with the patient. The possibilities of reaction to medication, pulmonary aspiration, perforation of viscus, bleeding, recurrent infection, finding a normal gallbladder, the need for additional procedures, failure to diagnose a condition, the possible need to convert to an open procedure, and creating a complication requiring transfusion or operation were discussed with the patient. The likelihood of improving the patient's symptoms with return to their baseline status is good.  The patient and/or family concurred with the proposed plan, giving informed consent. The site of surgery properly noted. The patient was taken to Operating Room, identified as [name] and the procedure verified as Laparoscopic Cholecystectomy with Intraoperative Cholangiogram. A Time Out was held and the above information confirmed.  Prior to the induction of general anesthesia, antibiotic prophylaxis was administered. General endotracheal anesthesia was then administered and tolerated well. After the induction, the abdomen was prepped with Chloraprep and draped in the sterile fashion. The patient was positioned in the supine position.  Local anesthetic agent was injected into the skin near the umbilicus and an incision made.  We dissected down to the abdominal fascia with blunt dissection.  The fascia was incised vertically and we entered the peritoneal cavity bluntly.  A pursestring suture of 0-Vicryl was placed around the fascial opening.  The Hasson cannula was inserted and secured with the stay suture.  Pneumoperitoneum was then created with CO2 and tolerated well without any adverse changes in the patient's vital signs. An 11-mm port was placed in the subxiphoid position.  Two 5-mm ports were placed in the right upper quadrant. All skin incisions were infiltrated with a local anesthetic agent before making the incision and placing the trocars.   We positioned the patient in reverse Trendelenburg, tilted slightly to the patient's left.  The gallbladder was identified, the fundus grasped and retracted cephalad. It was mildly thickened.  Adhesions were lysed bluntly and with the electrocautery where indicated, taking care not to injure any adjacent organs or viscus. The infundibulum was grasped and retracted laterally, exposing the peritoneum overlying the triangle of Calot. This was then divided and exposed in a blunt fashion. A critical view of the cystic duct and cystic artery was obtained.  The cystic duct was clearly identified and bluntly dissected circumferentially. The cystic duct was ligated with a clip distally.   An incision was made in the cystic duct and the [facility] cholangiogram catheter introduced. The catheter was secured using a clip. A cholangiogram was then obtained which showed good visualization of the distal and proximal biliary tree with no sign of filling defects or obstruction.  Contrast flowed easily into the duodenum. The catheter was then removed.   The cystic duct was then ligated with clips and divided. The cystic artery was identified, dissected free, ligated with clips and divided as well.   The gallbladder was dissected from the liver bed in retrograde fashion with the electrocautery. The gallbladder  was removed and placed in  an Endocatch sac. The liver bed was irrigated and inspected. Hemostasis was achieved with the electrocautery and SNOW. Copious irrigation was utilized and was repeatedly aspirated until clear.  The gallbladder and Endocatch sac were then removed through the umbilical port site.  The pursestring suture was used to close the umbilical fascia.    We again inspected the right upper quadrant for hemostasis.  Pneumoperitoneum was released as we removed the trocars.  4-0 Monocryl was used to close the skin.   Benzoin, steri-strips, and clean dressings were applied. The patient was then extubated and brought to the recovery room in stable condition. Instrument, sponge, and needle counts were correct at closure and at the conclusion of the case.   Findings: Cholecystitis with Cholelithiasis  Estimated Blood Loss: Minimal         Drains: none         Specimens: Gallbladder           Complications: None; patient tolerated the procedure well.         Disposition: PACU - hemodynamically stable.         Condition: stable  [name]. Vanessa Skains, [name], [facility] Surgery  General/ Trauma Surgery  01/31/2017 11:40 AM

## 2017-01-31 NOTE — Progress Notes (Signed)
2 Days Post-Op  Subjective: No pain today. No other questions  Objective: Vital signs in last 24 hours: Temp:  [98.2 F (36.8 C)-98.6 F (37 C)] 98.3 F (36.8 C) (05/01 0521) Pulse Rate:  [83-92] 83 (05/01 0521) Resp:  [16-18] 16 (05/01 0521) BP: (107-113)/(68-72) 110/70 (05/01 0521) SpO2:  [100 %] 100 % (05/01 0521) Last BM Date: 01/30/17  Intake/Output from previous day: 04/30 0701 - 05/01 0700 In: 290 [P.O.:290] Out: -  Intake/Output this shift: No intake/output data recorded.  General appearance: alert, cooperative and no distress GI: soft, non-tender; bowel sounds normal; no masses,  no organomegaly  Lab Results:   Recent Labs  01/30/17 0428 01/31/17 0346  WBC 5.7 5.2  HGB 9.3* 9.2*  HCT 30.5* 30.8*  PLT 385 375   BMET  Recent Labs  01/30/17 0428 01/31/17 0346  NA 139 139  K 3.8 3.7  CL 107 108  CO2 24 23  GLUCOSE 120* 114*  BUN 11 7  CREATININE 0.60 0.58  CALCIUM 9.1 9.0   Hepatic Function Latest Ref Rng & Units 01/31/2017 01/29/2017 01/27/2017  Total Protein 6.5 - 8.1 g/dL 7.3 7.9 7.7  Albumin 3.5 - 5.0 g/dL 3.2(L) 3.4(L) 3.7  AST 15 - 41 U/L 63(H) 240(H) 223(H)  ALT 14 - 54 U/L 180(H) 364(H) 335(H)  Alk Phosphatase 38 - 126 U/L 105 146(H) 142(H)  Total Bilirubin 0.3 - 1.2 mg/dL 0.6 1.0 1.0  Bilirubin, Direct 0.1 - 0.5 mg/dL 0.1 - -    PT/INR No results for input(s): LABPROT, INR in the last 72 hours. ABG No results for input(s): PHART, HCO3 in the last 72 hours.  Invalid input(s): PCO2, PO2  Studies/Results: No results found.  Anti-infectives: Anti-infectives    Start     Dose/Rate Route Frequency Ordered Stop   01/31/17 0600  ceFAZolin (ANCEF) IVPB 2g/100 mL premix     2 g 200 mL/hr over 30 Minutes Intravenous To ShortStay Surgical 01/30/17 0959 02/01/17 0600   01/30/17 0945  ceFAZolin (ANCEF) powder 2 g  Status:  Discontinued     2 g Other On call to O.R. 01/30/17 0939 01/30/17 0959      Assessment/Plan:  Plan lap chole with  IOC today.  The surgical procedure has been discussed with the patient.  Potential risks, benefits, alternative treatments, and expected outcomes have been explained.  All of the patient's questions at this time have been answered.  The likelihood of reaching the patient's treatment goal is good.  The patient understand the proposed surgical procedure and wishes to proceed.   LOS: 5 days    [name] K. 01/31/2017

## 2017-02-01 ENCOUNTER — Encounter (HOSPITAL_COMMUNITY): Payer: Self-pay | Admitting: Surgery

## 2017-02-01 DIAGNOSIS — Z419 Encounter for procedure for purposes other than remedying health state, unspecified: Secondary | ICD-10-CM

## 2017-02-01 LAB — HEPATIC FUNCTION PANEL
ALT: 149 U/L — ABNORMAL HIGH (ref 14–54)
AST: 80 U/L — ABNORMAL HIGH (ref 15–41)
Albumin: 3 g/dL — ABNORMAL LOW (ref 3.5–5.0)
Alkaline Phosphatase: 87 U/L (ref 38–126)
Bilirubin, Direct: 0.1 mg/dL (ref 0.1–0.5)
Indirect Bilirubin: 0.5 mg/dL (ref 0.3–0.9)
Total Bilirubin: 0.6 mg/dL (ref 0.3–1.2)
Total Protein: 6.8 g/dL (ref 6.5–8.1)

## 2017-02-01 LAB — CBC
HCT: 30.2 % — ABNORMAL LOW (ref 36.0–46.0)
Hemoglobin: 9.1 g/dL — ABNORMAL LOW (ref 12.0–15.0)
MCH: 19.5 pg — ABNORMAL LOW (ref 26.0–34.0)
MCHC: 30.1 g/dL (ref 30.0–36.0)
MCV: 64.8 fL — ABNORMAL LOW (ref 78.0–100.0)
Platelets: 374 10*3/uL (ref 150–400)
RBC: 4.66 MIL/uL (ref 3.87–5.11)
RDW: 17.9 % — ABNORMAL HIGH (ref 11.5–15.5)
WBC: 8.6 10*3/uL (ref 4.0–10.5)

## 2017-02-01 LAB — BASIC METABOLIC PANEL
Anion gap: 8 (ref 5–15)
BUN: <5 mg/dL — ABNORMAL LOW (ref 6–20)
CO2: 25 mmol/L (ref 22–32)
Calcium: 8.6 mg/dL — ABNORMAL LOW (ref 8.9–10.3)
Chloride: 102 mmol/L (ref 101–111)
Creatinine, Ser: 0.59 mg/dL (ref 0.44–1.00)
GFR calc Af Amer: >60 mL/min (ref >60)
GFR calc non Af Amer: >60 mL/min (ref >60)
Glucose, Bld: 124 mg/dL — ABNORMAL HIGH (ref 65–99)
Potassium: 3.8 mmol/L (ref 3.5–5.1)
Sodium: 135 mmol/L (ref 135–145)

## 2017-02-01 MED ORDER — SODIUM CHLORIDE 0.9 % IV SOLN
510.0000 mg | Freq: Once | INTRAVENOUS | Status: DC
  Filled 2017-02-01: qty 17

## 2017-02-01 NOTE — Progress Notes (Signed)
Pt s/p 5/1 lap chole with negative IOC.  Smooth muscle Ab, EBV virus, ANA, IgG tests, Hep B and C serologies all negative so have ruled out AIH  (autoimmune hepatitis) and viral hepatitis.   2 separate anemia/iron panels with low iron, low iron sats, and low ferritin c/w IDA.   s/p Feraheme infusion 4/30 for menorrhagia induced IDA.    Will sign off.  Does not need GI follow up.    Jennye Moccasin PA-C.

## 2017-02-01 NOTE — Progress Notes (Signed)
Family Medicine Teaching Service Daily Progress Note Intern Pager: [phone]  Patient name: [name] Medical record number: [id number] Date of birth: 03-29-1986 Age: 31 y.o. Gender: female  Primary Care Provider: Renold Don, MD Consultants: Gastroenterology, General Surgery Code Status: Full   Pt Overview and Major Events to Date:   04/26: Admit for epigastric pain and vomiting  04/29: GI determined symptomatic gall stones, otherwise neg EGD/MRCP, Gen surgery consulted    Assessment and Plan: [name] is a 31 y.o. female presenting with abd pain, N/V. No significant PMH.   #Epigastric pain, gallstones with transaminitis:  POD #1 lap chole, no intraoperative complications.  Patient is comfortable this morning and denies significant pain.  Tolerating liquid diet without nausea. Benign abdominal exam and bowel sounds present.  --GI following, appreciate recommendations: Smooth muscle Ab, EBV, ANA, IgG, Hep B and C serologies negative.  Autoimmune hepatitis and viral causes have been ruled out.  Does not need GI follow up.  --Follow up surgery recs post-op >> ADAT, ready for d/c from surgery standpoint, f/u outpatient. Tylenol/Ibuprofen as first line for pain and Oxycodone for second line if needed. -Will monitor one more day in setting of tachycardia and ferreheme administration tomorrow AM.  --Zofran prn for nausea/vomiting  --Protonix 40 mg daily  --will need hep B vaccination as outpatient   #Iron deficiency anemia: Hgb stable at 9.1. Likely acute on chronic. Hemoglobin stable at baseline (9.5-10).  Appears value of 7.5 4/28 was spurious. Iron studies support this finding. FOBT+ however patient menstruating when checked so may be false.  Anemia panel revealed likely iron deficiency anemia.  -s/p ferreheme transfusion 4/30 for menorrhagia induced IDA.   -Will repeat ferreheme admin tomorrow given low iron level.  -outpatient repeat FOBT once no longer on menstrual cycle.  Consider  outpatient referral to OB/GYN for workup of menorrhagia if anemia persists after transfusion, per GI recs.  -Discharge home with po iron supplementation   FEN/GI: NPO  for laparoscopic cholecystectomy Prophylaxis: SCDs   Disposition: Likely discharge home tomorrow.   Subjective:  Patient states she does not have any pain this AM. No complications with surgery.  Tolerating liquids.  No nausea or vomiting.  Family at bedside.   Objective: Temp:  [98.7 F (37.1 C)-102.8 F (39.3 C)] 99.9 F (37.7 C) (05/02 0719) Pulse Rate:  [116-125] 125 (05/02 0558) Resp:  [17] 17 (05/02 0558) BP: (105-108)/(69-82) 105/69 (05/02 0558) SpO2:  [99 %] 99 % (05/02 0558)   Physical Exam: General: 31 yo F, NAD, laying in bed with family at bedside  CV: RRR no MRG  Lungs: CTA B/L with comfortable WOB  Abdomen: soft, NTND, chole ports c/d/I with no surrounding erythema or swelling, +bs Skin: warm, dry  Laboratory:  Recent Labs Lab 01/31/17 0346 01/31/17 1416 02/01/17 0535  WBC 5.2 6.3 8.6  HGB 9.2* 10.0* 9.1*  HCT 30.8* 32.2* 30.2*  PLT 375 315 374    Recent Labs Lab 01/29/17 0551 01/30/17 0428 01/31/17 0346 01/31/17 0700 01/31/17 1416 02/01/17 0535 02/01/17 1000  NA 140 139 139  --   --  135  --   K 4.0 3.8 3.7  --   --  3.8  --   CL 108 107 108  --   --  102  --   CO2 20* 24 23  --   --  25  --   BUN --   --  <5*  --   CREATININE 0.63 0.60  0.58  --  0.62 0.59  --   CALCIUM 9.1 9.1 9.0  --   --  8.6*  --   PROT 7.9  --   --  7.3  --   --  6.8  BILITOT 1.0  --   --  0.6  --   --  0.6  ALKPHOS 146*  --   --  105  --   --  87  ALT 364*  --   --  180*  --   --  149*  AST 240*  --   --  63*  --   --  80*  GLUCOSE 92 120* 114*  --   --  124*  --    Mag: 2.1 (WNL) Phos: 3.4 (WNL) Lipase: 27 (WNL) UA: Amber, hazy, large hgb, 20 ketones, 100 protein, RBC too numerous to ct, rare bacteria, 6-30 squams Preg: Negative FOBT: Pending Hepatitis panel: Negative Vitamin B-12:  727 (WNL) Folate: 47.7 (WNL) Iron panel: iron 21 (L), TIBC 456 (H), sat ratio 5 (L) Ferritin: 11 (WNL) Retic ct: 1.2 (WNL)  Imaging/Diagnostic Tests: ESOPHAGOGASTRODUODENOSCOPY (01/29/2017) Findings: The esophagus was normal. The stomach was normal. The examined duodenum was normal. Impression: Normal esophagus. Normal stomach. Normal examined duodenum. No specimens collected.  MR ABDOMEN MRCP W WO CONTAST (01/28/2017) FINDINGS: Lower chest: No acute findings. Hepatobiliary: No masses identified. Diffuse hepatic steatosis noted. Multiple tiny gallstones are seen, without signs of acute cholecystitis. There is no evidence of biliary ductal dilatation, with common bile duct measuring 5 mm. No evidence of choledocholithiasis. Pancreas: No mass or inflammatory changes. No evidence of pancreatic ductal dilatation. Spleen:  Within normal limits in size and appearance. Adrenals/Urinary Tract: No masses identified. No evidence of hydronephrosis. Stomach/Bowel: Visualized portions within the abdomen are unremarkable. Vascular/Lymphatic: No pathologically enlarged lymph nodes identified. No abdominal aortic aneurysm. Other:  None. Musculoskeletal:  No suspicious bone lesions identified.  IMPRESSION: Cholelithiasis, without radiographic evidence of cholecystitis. No evidence of biliary ductal dilatation, choledocholithiasis, or other acute findings. Hepatic steatosis.  CT ABDOMEN PELVIS W CONTRAST (01/28/2017) FINDINGS: Lower chest: Lung bases clear Hepatobiliary: Diffuse fatty infiltration of liver. Gallbladder and liver otherwise unremarkable. Pancreas: Normal appearance Spleen: Normal appearance Adrenals/Urinary Tract: Adrenal glands normal appearance. Question tiny calculi versus early excretion of contrast material in the renal collecting systems. No hydronephrosis or ureteral dilatation. No evidence of renal mass. Bladder unremarkable. Stomach/Bowel: Normal appendix. Scattered  respiratory motion artifacts. Stomach and bowel loops otherwise grossly unremarkable.  Vascular/Lymphatic: Normal appearance Reproductive: Uterus and adnexa normal appearance Other: No definite free air or free fluid. Small umbilical hernia containing fat. No inflammatory process identified. Musculoskeletal: Osseous structures unremarkable.  IMPRESSION: Fatty infiltration of liver. Question tiny nonobstructing renal calculi versus early excretion of contrast material into the renal collecting systems. Small umbilical hernia containing fat. No definite acute intra-abdominal or intrapelvic abnormalities.  Freddrick March, MD 02/01/2017, 2:16 PM PGY-1, [facility] Family Medicine FPTS Intern pager: [phone], text pages welcome

## 2017-02-01 NOTE — Progress Notes (Signed)
Transitions of Care Pharmacy Note  Plan:  Educated husband on likely addition of oral iron and possibly pain medications.  Recommend discharging patient with stool softener if both iron and opioid prescribed.  --------------------------------------------- [name] is an 31 y.o. female who presents with a chief complaint of gallstones. In anticipation of discharge, pharmacy has reviewed this patient's prior to admission medication history, as well as current inpatient medications listed per the [facility].  Current medication indications, dosing, frequency, and notable side effects reviewed with family. family verbalized understanding of current inpatient medication regimen and are aware that the After Visit Summary when presented, will represent the most accurate medication list at discharge.   Discussed likely addition of oral iron and possibly a pain medication with husband. Educated that these medications can cause constipation as one of the main side effects.   Assessment: Understanding of regimen: fair Understanding of indications: fair Potential of compliance: fair Barriers to Obtaining Medications: No  Patient instructed to contact inpatient pharmacy team with further questions or concerns if needed.    Time spent preparing for discharge counseling: 10 minutes Time spent counseling patient: 10 minutes    Thank you for allowing pharmacy to be a part of this patient's care.  Hillis Range, PharmD PGY1 Pharmacy Resident Pager: [phone]

## 2017-02-01 NOTE — Anesthesia Postprocedure Evaluation (Addendum)
Anesthesia Post Note  Patient: [name]  Procedure(s) Performed: Procedure(s) (LRB): LAPAROSCOPIC CHOLECYSTECTOMY WITH INTRAOPERATIVE CHOLANGIOGRAM; REPAIR OF UMBILICAL HERNIA (N/A)  Patient location during evaluation: PACU Anesthesia Type: General Level of consciousness: awake and alert Pain management: pain level controlled Vital Signs Assessment: post-procedure vital signs reviewed and stable Respiratory status: spontaneous breathing, nonlabored ventilation, respiratory function stable and patient connected to nasal cannula oxygen Cardiovascular status: blood pressure returned to baseline and stable Postop Assessment: no signs of nausea or vomiting Anesthetic complications: no       Last Vitals:  Vitals:   02/01/17 0558 02/01/17 0719  BP: 105/69   Pulse: (!) 125   Resp: 17   Temp: (!) 39.3 C 37.7 C    Last Pain:  Vitals:   02/01/17 0719  TempSrc: Oral  PainSc:                  Tija Biss

## 2017-02-01 NOTE — Progress Notes (Signed)
CC:  Abdominal pain  1 Day Post-Op  Subjective: She looks pretty good this AM.  She had some fever earlier, but that appear better.  I will let Medicine review that.  She reports some sweating but it is not currently an issue on exam.  Port sites all look good.  I will advance her diet and mobilize her.    Objective: Vital signs in last 24 hours: Temp:  [97.3 F (36.3 C)-102.8 F (39.3 C)] 99.9 F (37.7 C) (05/02 0719) Pulse Rate:  [95-125] 125 (05/02 0558) Resp:  [10-18] 17 (05/02 0558) BP: (105-140)/(69-103) 105/69 (05/02 0558) SpO2:  [99 %-100 %] 99 % (05/02 0558) Last BM Date: 01/30/17 340 PO 1348 IV 700 urine recorded TM 102.8 this AM -  0500 BMP/CBC OK this Am  WBC is 8.6K IOC:  Intraoperative cholangiogram demonstrates extrahepatic biliary ducts of unremarkable caliber, with no large filling defect identified. Free flow of contrast across the ampulla. Intake/Output from previous day: 05/01 0701 - 05/02 0700 In: 1688.3 [P.O.:340; I.V.:1348.3] Out: 710 [Urine:700; Blood:10] Intake/Output this shift: No intake/output data recorded.  General appearance: alert, cooperative and no distress GI: soft, sore, sites all look fine.      Lab Results:   Recent Labs  01/31/17 1416 02/01/17 0535  WBC 6.3 8.6  HGB 10.0* 9.1*  HCT 32.2* 30.2*  PLT 315 374    BMET  Recent Labs  01/31/17 0346 01/31/17 1416 02/01/17 0535  NA 139  --  135  K 3.7  --  3.8  CL 108  --  102  CO2 23  --  25  GLUCOSE 114*  --  124*  BUN 7  --  <5*  CREATININE 0.58 0.62 0.59  CALCIUM 9.0  --  8.6*   PT/INR No results for input(s): LABPROT, INR in the last 72 hours.   Recent Labs Lab 01/26/17 1707 01/27/17 0756 01/29/17 0551 01/31/17 0700  AST 349* 223* 240* 63*  ALT 392* 335* 364* 180*  ALKPHOS 139* 142* 146* 105  BILITOT 0.7 1.0 1.0 0.6  PROT 7.8 7.7 7.9 7.3  ALBUMIN 3.7 3.7 3.4* 3.2*     Lipase     Component Value Date/Time   LIPASE 27 01/26/2017 1707      Studies/Results: Dg Cholangiogram Operative  Result Date: 01/31/2017 CLINICAL DATA:  31 year old female with a history of cholangiogram for cholelithiasis EXAM: INTRAOPERATIVE CHOLANGIOGRAM TECHNIQUE: Cholangiographic images from the C-arm fluoroscopic device were submitted for interpretation post-operatively. Please see the procedural report for the amount of contrast and the fluoroscopy time utilized. COMPARISON:  MR 01/28/2017 FINDINGS: Surgical instruments project over the upper abdomen. There is cannulation of the cystic duct/gallbladder neck, with antegrade infusion of contrast. Caliber of the extrahepatic ductal system within normal limits. No large filling defect identified. Free flow of contrast across the ampulla. IMPRESSION: Intraoperative cholangiogram demonstrates extrahepatic biliary ducts of unremarkable caliber, with no large filling defect identified. Free flow of contrast across the ampulla. Please refer to the dictated operative report for full details of intraoperative findings and procedure Electronically Signed   By: Gilmer Mor D.O.   On: 01/31/2017 12:25   Prior to Admission medications   Medication Sig Start Date End Date Taking? Authorizing Provider  ibuprofen (ADVIL,MOTRIN) 200 MG tablet Take 400 mg by mouth every 6 (six) hours as needed.   Yes Historical Provider, MD    Medications: . enoxaparin (LOVENOX) injection  40 mg Subcutaneous Q24H  . pantoprazole  40 mg Oral Daily   .  sodium chloride 50 mL/hr at 02/01/17 0233  . lactated ringers     Assessment/Plan Calculus of gallbladder with other cholecystitis, without mention of obstruction EGD 01/29/17 - Dr Elnoria Howard Laparoscopic cholecystectomy with IOC, 01/31/17, Dr. Manus Rudd FEN:  IV fluids/clears ID:  Rocephin pre op DVT:  Lovenox   Plan:  I will advance her diet, mobilize and if she is tolerating diet, voiding, pain controlled with PO meds, she can go home from our standpoint.  Our office will call with follow  up with the [facility] in our office.  DC instructions are in the AVS.  She can have Tylenol/Ibuprofen as first line for pain and Oxycodone for second line pain issues.  I have told her and her husband to restrict lifting to 20 lbs.  She has 3 young children at home. Call if there are any questions.        LOS: 6 days    [name] 02/01/2017 [phone]

## 2017-02-02 LAB — BASIC METABOLIC PANEL
Anion gap: 9 (ref 5–15)
BUN: <5 mg/dL — ABNORMAL LOW (ref 6–20)
CO2: 24 mmol/L (ref 22–32)
Calcium: 8.5 mg/dL — ABNORMAL LOW (ref 8.9–10.3)
Chloride: 105 mmol/L (ref 101–111)
Creatinine, Ser: 0.57 mg/dL (ref 0.44–1.00)
GFR calc Af Amer: >60 mL/min (ref >60)
GFR calc non Af Amer: >60 mL/min (ref >60)
Glucose, Bld: 104 mg/dL — ABNORMAL HIGH (ref 65–99)
Potassium: 3.4 mmol/L — ABNORMAL LOW (ref 3.5–5.1)
Sodium: 138 mmol/L (ref 135–145)

## 2017-02-02 LAB — CBC
HCT: 29.3 % — ABNORMAL LOW (ref 36.0–46.0)
Hemoglobin: 8.5 g/dL — ABNORMAL LOW (ref 12.0–15.0)
MCH: 19.1 pg — ABNORMAL LOW (ref 26.0–34.0)
MCHC: 29 g/dL — ABNORMAL LOW (ref 30.0–36.0)
MCV: 65.7 fL — ABNORMAL LOW (ref 78.0–100.0)
Platelets: 342 10*3/uL (ref 150–400)
RBC: 4.46 MIL/uL (ref 3.87–5.11)
RDW: 18.2 % — ABNORMAL HIGH (ref 11.5–15.5)
WBC: 7.3 10*3/uL (ref 4.0–10.5)

## 2017-02-02 MED ORDER — OXYCODONE HCL 5 MG PO TABS: 5.0000 mg | ORAL_TABLET | ORAL | 0 refills | Status: DC | PRN

## 2017-02-02 MED ORDER — SENNOSIDES-DOCUSATE SODIUM 8.6-50 MG PO TABS: 1.0000 | ORAL_TABLET | Freq: Every day | ORAL | 0 refills | Status: DC

## 2017-02-02 MED ORDER — FERROUS SULFATE 325 (65 FE) MG PO TABS: 325.0000 mg | ORAL_TABLET | ORAL | 3 refills | Status: DC

## 2017-02-02 MED ORDER — PANTOPRAZOLE SODIUM 40 MG PO TBEC: 40.0000 mg | DELAYED_RELEASE_TABLET | Freq: Every day | ORAL | 1 refills | Status: DC

## 2017-02-02 MED ORDER — SENNOSIDES-DOCUSATE SODIUM 8.6-50 MG PO TABS
1.0000 | ORAL_TABLET | Freq: Once | ORAL | Status: AC
  Administered 2017-02-02: 1 via ORAL
  Filled 2017-02-02: qty 1

## 2017-02-02 MED ORDER — SODIUM CHLORIDE 0.9 % IV SOLN
510.0000 mg | Freq: Once | INTRAVENOUS | Status: AC
  Administered 2017-02-02: 510 mg via INTRAVENOUS
  Filled 2017-02-02: qty 17

## 2017-02-02 NOTE — Progress Notes (Signed)
[name] to be D/C'd Home per MD order.  Discussed with the patient and all questions fully answered.  VSS, Skin clean, dry and intact without evidence of skin break down, no evidence of skin tears noted. IV catheter discontinued intact. Site without signs and symptoms of complications. Dressing and pressure applied.  An After Visit Summary was printed and given to the patient. Patient received prescription.  D/c education completed with patient/family including follow up instructions, medication list, d/c activities limitations if indicated, with other d/c instructions as indicated by MD - patient able to verbalize understanding, all questions fully answered.   Patient instructed to return to ED, call 911, or call MD for any changes in condition.   Patient escorted via WC, and D/C home via private auto.  Rock NephewLisbeth  Tashi Band 02/02/2017 3:04 PM

## 2017-02-02 NOTE — Progress Notes (Signed)
Family Medicine Teaching Service Daily Progress Note Intern Pager: [phone]  Patient name: [name] Medical record number: [id number] Date of birth: [birth year] Age: 31 y.o. Gender: female  Primary Care Provider: Renold DonWALDEN,JEFF, MD Consultants: Gastroenterology, General Surgery Code Status: Full   Pt Overview and Major Events to Date:   04/26: Admit for epigastric pain and vomiting  04/29: GI determined symptomatic gall stones, otherwise neg EGD/MRCP, Gen surgery consulted    Assessment and Plan: Vanessa Chickajat M Erney is a 31 y.o. female presenting with abd pain, N/V. No significant PMH.   Epigastric pain, gallstones with transaminitis:  POD #2 lap chole, no intraoperative complications.   -GI following, appreciate recommendations: serologies negative.  Autoimmune hepatitis and viral causes have been ruled out.  Does not need GI follow up.  -Ready for discharge from surgery recs -Zofran prn  -Protonix 40 mg daily  -will need hep B vaccination as outpatient   Iron deficiency anemia: Hgb stable at 9.1. Likely acute on chronic. Hemoglobin stable at baseline (9.5-10).  Appears value of 7.5 4/28 was spurious. Iron studies support this finding. FOBT+ however patient menstruating when checked so may be false.  Anemia panel revealed likely iron deficiency anemia.  -s/p ferreheme transfusion 4/30 for menorrhagia induced IDA -give additional ferreheme today -outpatient repeat FOBT once no longer on menstrual cycle -Discharge home with po iron supplementation   FEN/GI: regular diet Prophylaxis: SCDs   Disposition: discharge today  Subjective:  Patient feels well, endorses passing gas but no stools. Pain medication is working, patient excited to go home today.   Objective: Temp:  [98.7 F (37.1 C)-100.3 F (37.9 C)] 98.8 F (37.1 C) (05/03 0502) Pulse Rate:  [101-109] 101 (05/03 0502) Resp:  [18-20] 18 (05/03 0502) BP: (104-111)/(67-74) 111/74 (05/03 0502) SpO2:  [93 %-100 %] 96 % (05/03  0502)   Physical Exam: General: 31 yo F, NAD, laying in bed with family at bedside  CV: RRR no MRG  Lungs: CTAB with comfortable WOB  Abdomen: soft, NTND, ports c/d/I with no surrounding erythema or swelling, +bs Skin: warm, dry  Laboratory:  Recent Labs Lab 01/31/17 1416 02/01/17 0535 02/02/17 0316  WBC 6.3 8.6 7.3  HGB 10.0* 9.1* 8.5*  HCT 32.2* 30.2* 29.3*  PLT 315 374 342    Recent Labs Lab 01/29/17 0551  01/31/17 0346 01/31/17 0700 01/31/17 1416 02/01/17 0535 02/01/17 1000 02/02/17 0316  NA 140  < > 139  --   --  135  --  138  K 4.0  < > 3.7  --   --  3.8  --  3.4*  CL 108  < > 108  --   --  102  --  105  CO2 20*  < > 23  --   --  25  --  24  BUN 6  < > 7  --   --  <5*  --  <5*  CREATININE 0.63  < > 0.58  --  0.62 0.59  --  0.57  CALCIUM 9.1  < > 9.0  --   --  8.6*  --  8.5*  PROT 7.9  --   --  7.3  --   --  6.8  --   BILITOT 1.0  --   --  0.6  --   --  0.6  --   ALKPHOS 146*  --   --  105  --   --  87  --   ALT 364*  --   --  180*  --   --  149*  --   AST 240*  --   --  63*  --   --  80*  --   GLUCOSE 92  < > 114*  --   --  124*  --  104*  < > = values in this interval not displayed. Mag: 2.1 (WNL) Phos: 3.4 (WNL) Lipase: 27 (WNL) UA: Amber, hazy, large hgb, 20 ketones, 100 protein, RBC too numerous to ct, rare bacteria, 6-30 squams Preg: Negative FOBT: Pending Hepatitis panel: Negative Vitamin B-12: 727 (WNL) Folate: 47.7 (WNL) Iron panel: iron 21 (L), TIBC 456 (H), sat ratio 5 (L) Ferritin: 11 (WNL) Retic ct: 1.2 (WNL)  Imaging/Diagnostic Tests: ESOPHAGOGASTRODUODENOSCOPY (01/29/2017) Findings: The esophagus was normal. The stomach was normal. The examined duodenum was normal. Impression: Normal esophagus. Normal stomach. Normal examined duodenum. No specimens collected.  MR ABDOMEN MRCP W WO CONTAST (01/28/2017) FINDINGS: Lower chest: No acute findings. Hepatobiliary: No masses identified. Diffuse hepatic steatosis noted. Multiple tiny  gallstones are seen, without signs of acute cholecystitis. There is no evidence of biliary ductal dilatation, with common bile duct measuring 5 mm. No evidence of choledocholithiasis. Pancreas: No mass or inflammatory changes. No evidence of pancreatic ductal dilatation. Spleen:  Within normal limits in size and appearance. Adrenals/Urinary Tract: No masses identified. No evidence of hydronephrosis. Stomach/Bowel: Visualized portions within the abdomen are unremarkable. Vascular/Lymphatic: No pathologically enlarged lymph nodes identified. No abdominal aortic aneurysm. Other:  None. Musculoskeletal:  No suspicious bone lesions identified.  IMPRESSION: Cholelithiasis, without radiographic evidence of cholecystitis. No evidence of biliary ductal dilatation, choledocholithiasis, or other acute findings. Hepatic steatosis.  CT ABDOMEN PELVIS W CONTRAST (01/28/2017) FINDINGS: Lower chest: Lung bases clear Hepatobiliary: Diffuse fatty infiltration of liver. Gallbladder and liver otherwise unremarkable. Pancreas: Normal appearance Spleen: Normal appearance Adrenals/Urinary Tract: Adrenal glands normal appearance. Question tiny calculi versus early excretion of contrast material in the renal collecting systems. No hydronephrosis or ureteral dilatation. No evidence of renal mass. Bladder unremarkable. Stomach/Bowel: Normal appendix. Scattered respiratory motion artifacts. Stomach and bowel loops otherwise grossly unremarkable.  Vascular/Lymphatic: Normal appearance Reproductive: Uterus and adnexa normal appearance Other: No definite free air or free fluid. Small umbilical hernia containing fat. No inflammatory process identified. Musculoskeletal: Osseous structures unremarkable.  IMPRESSION: Fatty infiltration of liver. Question tiny nonobstructing renal calculi versus early excretion of contrast material into the renal collecting systems. Small umbilical hernia containing fat. No definite acute  intra-abdominal or intrapelvic abnormalities.  Garth Bigness, MD 02/02/2017, 7:23 AM PGY-1, [facility] Family Medicine FPTS Intern pager: [phone], text pages welcome

## 2017-02-02 NOTE — Progress Notes (Signed)
2 Days Post-Op  Subjective: She looks fine has not been out of the room so far.  Sites all look good, sore as expected.  She can shower this AM.    Objective: Vital signs in last 24 hours: Temp:  [98.7 F (37.1 C)-100.3 F (37.9 C)] 98.8 F (37.1 C) (05/03 0502) Pulse Rate:  [101-109] 101 (05/03 0502) Resp:  [18-20] 18 (05/03 0502) BP: (104-111)/(67-74) 111/74 (05/03 0502) SpO2:  [93 %-100 %] 96 % (05/03 0502) Last BM Date: 01/31/17 290 PO recorded 600 IV 400 urine recorded TM 100.3 at 1900 last PM.  HR still up some, BP is fine K+ 3.4, otherwise normal CMP and WBC H/H is dow some No films   Intake/Output from previous day: 05/02 0701 - 05/03 0700 In: 890 [P.O.:290; I.V.:600] Out: 400 [Urine:400] Intake/Output this shift: No intake/output data recorded.  General appearance: alert, cooperative and no distress GI: soft, sore sites OK.    Lab Results:   Recent Labs  02/01/17 0535 02/02/17 0316  WBC 8.6 7.3  HGB 9.1* 8.5*  HCT 30.2* 29.3*  PLT 374 342    BMET  Recent Labs  02/01/17 0535 02/02/17 0316  NA 135 138  K 3.8 3.4*  CL 102 105  CO2 25 24  GLUCOSE 124* 104*  BUN <5* <5*  CREATININE 0.59 0.57  CALCIUM 8.6* 8.5*   PT/INR No results for input(s): LABPROT, INR in the last 72 hours.   Recent Labs Lab 01/26/17 1707 01/27/17 0756 01/29/17 0551 01/31/17 0700 02/01/17 1000  AST 349* 223* 240* 63* 80*  ALT 392* 335* 364* 180* 149*  ALKPHOS 139* 142* 146* 105 87  BILITOT 0.7 1.0 1.0 0.6 0.6  PROT 7.8 7.7 7.9 7.3 6.8  ALBUMIN 3.7 3.7 3.4* 3.2* 3.0*     Lipase     Component Value Date/Time   LIPASE 27 01/26/2017 1707     Studies/Results: Dg Cholangiogram Operative  Result Date: 01/31/2017 CLINICAL DATA:  31 year old female with a history of cholangiogram for cholelithiasis EXAM: INTRAOPERATIVE CHOLANGIOGRAM TECHNIQUE: Cholangiographic images from the C-arm fluoroscopic device were submitted for interpretation post-operatively. Please see  the procedural report for the amount of contrast and the fluoroscopy time utilized. COMPARISON:  MR 01/28/2017 FINDINGS: Surgical instruments project over the upper abdomen. There is cannulation of the cystic duct/gallbladder neck, with antegrade infusion of contrast. Caliber of the extrahepatic ductal system within normal limits. No large filling defect identified. Free flow of contrast across the ampulla. IMPRESSION: Intraoperative cholangiogram demonstrates extrahepatic biliary ducts of unremarkable caliber, with no large filling defect identified. Free flow of contrast across the ampulla. Please refer to the dictated operative report for full details of intraoperative findings and procedure Electronically Signed   By: Gilmer MorJaime  Wagner D.O.   On: 01/31/2017 12:25    Medications: . enoxaparin (LOVENOX) injection  40 mg Subcutaneous Q24H  . pantoprazole  40 mg Oral Daily   . sodium chloride 50 mL/hr at 02/01/17 2215  . lactated ringers      Assessment/Plan Calculus of gallbladder with other cholecystitis, without mention of obstruction EGD 01/29/17 - Dr Elnoria HowardHung Laparoscopic cholecystectomy with IOC, 01/31/17, Dr. Manus RuddMatthew Tsuei FEN:  IV fluids/clears ID:  Rocephin pre op DVT:  Lovenox   Plan:  From our standpoint she is doing well.  I would get her up moving more, be sure she is doing IS. Advance diet.  We will see again in the office.  Shower and if steri strips do not come off with shower,she  can take them off in 1 week.      LOS: 7 days    Aric Jost 02/02/2017 [phone]

## 2017-02-08 ENCOUNTER — Ambulatory Visit (INDEPENDENT_AMBULATORY_CARE_PROVIDER_SITE_OTHER): Payer: BLUE CROSS/BLUE SHIELD | Admitting: Student in an Organized Health Care Education/Training Program

## 2017-02-08 ENCOUNTER — Encounter: Payer: Self-pay | Admitting: Student in an Organized Health Care Education/Training Program

## 2017-02-08 ENCOUNTER — Other Ambulatory Visit (HOSPITAL_COMMUNITY)
Admission: RE | Admit: 2017-02-08 | Discharge: 2017-02-08 | Disposition: A | Discharge status: Home / Self Care | Payer: BLUE CROSS/BLUE SHIELD | Source: Ambulatory Visit | Attending: Family Medicine | Admitting: Family Medicine

## 2017-02-08 VITALS — BP 104/74 | HR 101 | Temp 98.5°F | Ht 63.0 in | Wt 170.0 lb

## 2017-02-08 DIAGNOSIS — N949 Unspecified condition associated with female genital organs and menstrual cycle: Secondary | ICD-10-CM

## 2017-02-08 DIAGNOSIS — Z113 Encounter for screening for infections with a predominantly sexual mode of transmission: Secondary | ICD-10-CM | POA: Diagnosis not present

## 2017-02-08 DIAGNOSIS — Z23 Encounter for immunization: Secondary | ICD-10-CM | POA: Diagnosis not present

## 2017-02-08 DIAGNOSIS — Z419 Encounter for procedure for purposes other than remedying health state, unspecified: Secondary | ICD-10-CM

## 2017-02-08 DIAGNOSIS — N920 Excessive and frequent menstruation with regular cycle: Secondary | ICD-10-CM | POA: Diagnosis not present

## 2017-02-08 LAB — POCT WET PREP (WET MOUNT)
Clue Cells Wet Prep Whiff POC: NEGATIVE
Trichomonas Wet Prep HPF POC: ABSENT

## 2017-02-08 MED ORDER — POLYETHYLENE GLYCOL 3350 17 G PO PACK: 17.0000 g | PACK | Freq: Every day | ORAL | 0 refills | Status: DC

## 2017-02-08 NOTE — Patient Instructions (Signed)
It was a pleasure seeing you today in our clinic. Today we discussed your recent surgery, your vaginal bleeding and the vaginal lump. Here is the treatment plan we have discussed and agreed upon together:  For constipation, use Miralax and titrate to 1-2 soft bowel movements per day  We will recheck labs for your hemoglobin today  We will give the Hep B vaccine today  We did screening labs for sexually transmitted infections today, we will call with the results of these tests. If you do not hear from us in 1 week please call back our office. Your vaginal exam was completely normal.  Our clinic's number is [phone]. Please call with questions or concerns about what we discussed today.  Be well, Dr. Mosetta PuttFeng

## 2017-02-08 NOTE — Progress Notes (Signed)
   HPI:  [name] presents for hospital follow up. Patient was hospitalized from 01/26/2017 to 02/02/2017 with epigastric pain and vomiting due to acute cholecystitis.    S/p Lap Chole - no redness or drainage from surgical wounds noted - no recent fevers - no abdominal pain - no N/V/D  Menorrhagia - noted during the hospitalization, patient endorses now improved - no light headedness, palpatations or fatigue - Hgb stable at DC - endorses taking Fe supplementation appropriately - does endorse increased constipation, takes miralax only once daily  Hep B non-immune - agreeable to Hep B vaccination today  Vaginal "Lump" - chronic, noted 1 year ago, unchanged per patient. No drainage. Denies STD exposure however would like to be tested. One female partner (husband). No abnormal discharge, urinary symptoms or pain.  ROS: See HPI.  PMFSH:  CC, smoking hx reviewed  PHYSICAL EXAM: BP 104/74   Pulse (!) 101   Temp 98.5 F (36.9 C) (Oral)   Ht 5\' 3"  (1.6 m)   Wt 77.1 kg (170 lb)   LMP 01/26/2017   SpO2 99%   BMI 30.11 kg/m  Gen: NAD, sits comfortably HEENT: NCAT, MMM Heart: RRR, no m/r/g Lungs: CTA bil, no W/R/R Neuro: CN II-XII grossly intact Ext: warm and well-perfused no redness or edema in LE bil GU: noted s/p genital mutilation/clitorectomy. Otherwise, labia majora and minora grossly normal, no lumps noted, no lesions, internal exam normal, +mild mucousy DC from cervical os, cervix appears normal  ASSESSMENT/PLAN:  Menorrhagia with regular cycle - improved per patient, regular cycle this month - continue ferrous sulfate - titrate up miralax as needed for 1-2 soft BM per day - recheck CBC this visit Hgb stable  Need for hepatitis B vaccination - immunization given in office today  Vaginal lump Not seen on physical exam, though patient endorses it is there - [name] also examined and her findings were consistent with those documented in my exam - Patient asked  for STI screening, so HIV, RPR, wet prep, GC/Chl and Hep B panel ordered - will follow up with STI screening results  Routine screening for STI (sexually transmitted infection) See above  Surgery, elective s/p lap chole, incisions healing well, no red flags - no further follow up needed  Howard PouchLauren Yvetta Drotar, MD, MS PGY1 [facility]

## 2017-02-09 DIAGNOSIS — N949 Unspecified condition associated with female genital organs and menstrual cycle: Secondary | ICD-10-CM | POA: Insufficient documentation

## 2017-02-09 DIAGNOSIS — N920 Excessive and frequent menstruation with regular cycle: Secondary | ICD-10-CM | POA: Insufficient documentation

## 2017-02-09 DIAGNOSIS — Z309 Encounter for contraceptive management, unspecified: Secondary | ICD-10-CM | POA: Insufficient documentation

## 2017-02-09 DIAGNOSIS — Z113 Encounter for screening for infections with a predominantly sexual mode of transmission: Secondary | ICD-10-CM | POA: Insufficient documentation

## 2017-02-09 DIAGNOSIS — Z23 Encounter for immunization: Secondary | ICD-10-CM | POA: Insufficient documentation

## 2017-02-09 LAB — ACUTE HEP PANEL AND HEP B SURFACE AB
Hep A IgM: NEGATIVE
Hep B C IgM: NEGATIVE
Hep C Virus Ab: <0.1 s/co ratio (ref 0.0–0.9)
Hepatitis B Surf Ab Quant: 3.2 m[IU]/mL — ABNORMAL LOW (ref >9.9)
Hepatitis B Surface Ag: NEGATIVE

## 2017-02-09 LAB — CBC
Hematocrit: 33.3 % — ABNORMAL LOW (ref 34.0–46.6)
Hemoglobin: 10.2 g/dL — ABNORMAL LOW (ref 11.1–15.9)
MCH: 20.7 pg — ABNORMAL LOW (ref 26.6–33.0)
MCHC: 30.6 g/dL — ABNORMAL LOW (ref 31.5–35.7)
MCV: 68 fL — ABNORMAL LOW (ref 79–97)
Platelets: 418 10*3/uL — ABNORMAL HIGH (ref 150–379)
RBC: 4.92 x10E6/uL (ref 3.77–5.28)
RDW: 22.9 % — ABNORMAL HIGH (ref 12.3–15.4)
WBC: 5.6 10*3/uL (ref 3.4–10.8)

## 2017-02-09 LAB — RPR: RPR Ser Ql: NONREACTIVE

## 2017-02-09 LAB — CERVICOVAGINAL ANCILLARY ONLY
Chlamydia: NEGATIVE
Neisseria Gonorrhea: NEGATIVE

## 2017-02-09 LAB — HIV ANTIBODY (ROUTINE TESTING W REFLEX): HIV Screen 4th Generation wRfx: NONREACTIVE

## 2017-02-09 NOTE — Assessment & Plan Note (Signed)
s/p lap chole, incisions healing well, no red flags - no further follow up needed

## 2017-02-09 NOTE — Assessment & Plan Note (Signed)
-   immunization given in office today

## 2017-02-09 NOTE — Assessment & Plan Note (Addendum)
Not seen on physical exam, though patient endorses it is there - [name] also examined and her findings were consistent with those documented in my exam - Patient asked for STI screening, so HIV, RPR, wet prep, GC/Chl and Hep B panel ordered - will follow up with STI screening results

## 2017-02-09 NOTE — Assessment & Plan Note (Signed)
See above

## 2017-02-09 NOTE — Assessment & Plan Note (Signed)
-   improved per patient, regular cycle this month - continue ferrous sulfate - titrate up miralax as needed for 1-2 soft BM per day - recheck CBC this visit Hgb stable

## 2017-02-10 ENCOUNTER — Encounter: Payer: Self-pay | Admitting: Student in an Organized Health Care Education/Training Program

## 2017-03-06 NOTE — Addendum Note (Signed)
Addendum  created 03/06/17 1440 by Val EagleMoser, Fremon Zacharia, MD   Sign clinical note

## 2017-12-05 ENCOUNTER — Ambulatory Visit (INDEPENDENT_AMBULATORY_CARE_PROVIDER_SITE_OTHER): Payer: BLUE CROSS/BLUE SHIELD | Admitting: Family Medicine

## 2017-12-05 ENCOUNTER — Encounter: Payer: Self-pay | Admitting: Family Medicine

## 2017-12-05 ENCOUNTER — Other Ambulatory Visit: Payer: Self-pay

## 2017-12-05 VITALS — BP 122/84 | HR 93 | Temp 98.1°F | Ht 63.0 in | Wt 179.8 lb

## 2017-12-05 DIAGNOSIS — R7989 Other specified abnormal findings of blood chemistry: Secondary | ICD-10-CM

## 2017-12-05 DIAGNOSIS — Z23 Encounter for immunization: Secondary | ICD-10-CM

## 2017-12-05 DIAGNOSIS — J302 Other seasonal allergic rhinitis: Secondary | ICD-10-CM | POA: Diagnosis not present

## 2017-12-05 DIAGNOSIS — R945 Abnormal results of liver function studies: Secondary | ICD-10-CM | POA: Diagnosis not present

## 2017-12-05 DIAGNOSIS — N920 Excessive and frequent menstruation with regular cycle: Secondary | ICD-10-CM

## 2017-12-05 DIAGNOSIS — R490 Dysphonia: Secondary | ICD-10-CM | POA: Diagnosis not present

## 2017-12-05 DIAGNOSIS — D509 Iron deficiency anemia, unspecified: Secondary | ICD-10-CM

## 2017-12-05 MED ORDER — HYDROCORTISONE 2.5 % EX CREA: TOPICAL_CREAM | Freq: Two times a day (BID) | CUTANEOUS | 0 refills | Status: DC

## 2017-12-05 NOTE — Assessment & Plan Note (Signed)
Likely contributor to hoarseness.  Can attempt flonase for relief.

## 2017-12-05 NOTE — Patient Instructions (Addendum)
It was good to see you again today.  We are checking some labs.  I will let you know these results.   Use the hydrocortisone cream on your light spots.  If you continue to notice changes in your vision, let me know.

## 2017-12-05 NOTE — Assessment & Plan Note (Signed)
Cycle now becoming less regular/prolonged.  Likely cause of ongoing microcytic anemia.  Rechecking today.  Declined any OCPs.  If continues to be anemic will refer to gynecology at patient request.

## 2017-12-05 NOTE — Progress Notes (Signed)
Subjective:   Arabic interpreter [name] [phone] used for entire visit:  [name] is a 32 y.o. female who presents to [facility] today for several concerns, including hoarseness:  1.  Hoarseness:  Had recent URI and still hoarse.  Occasionally with runny nose but this is generally better since URI.  No cough.  NO GERD symptoms.  Inconsistent hoarseness.  No weight loss.    2.  Menorrhagia: Present for several months.  States that her periods getting heavier.  Used to last 5 days now last at least 7.  Heaviest the first 4 days.  She is having these extra tampons and pads.  She does have some lightheadedness at times.  Also with swelling in her ankles but this is mostly after she has been on her feet all day.  Also sometimes with difficulty seeing small objects with her close to her eyes.  Occasional headaches.  3.  Skin color changes: She has noticed a few spots on her arms and around her lips which are lighter than usual.  No other skin lesions or rash.  Present before recent URI.  Hasn't tried anything for relief.  NO itching.     ROS as above per HPI.   The following portions of the patient's history were reviewed and updated as appropriate: allergies, current medications, past medical history, family and social history, and problem list. Patient is a nonsmoker.    PMH reviewed.  Past Medical History:  Diagnosis Date  . Anemia    Past Surgical History:  Procedure Laterality Date  . CESAREAN SECTION  2009; 2011; 2016   x 2 in [place]  . CESAREAN SECTION N/A 12/22/2014   Procedure: CESAREAN SECTION;  Surgeon: Tereso Newcomer, MD;  Location: [facility];  Service: Obstetrics;  Laterality: N/A;  . CHOLECYSTECTOMY N/A 01/31/2017   Procedure: LAPAROSCOPIC CHOLECYSTECTOMY WITH INTRAOPERATIVE CHOLANGIOGRAM; REPAIR OF UMBILICAL HERNIA;  Surgeon: Manus Rudd, MD;  Location: [facility] OR;  Service: General;  Laterality: N/A;  . ESOPHAGOGASTRODUODENOSCOPY N/A 01/29/2017   Procedure: ESOPHAGOGASTRODUODENOSCOPY (EGD);   Surgeon: Jeani Hawking, MD;  Location: [facility] ENDOSCOPY;  Service: Endoscopy;  Laterality: N/A;    Medications reviewed. Current Outpatient Medications  Medication Sig Dispense Refill  . ferrous sulfate 325 (65 FE) MG tablet Take 1 tablet (325 mg total) by mouth every other day. 30 tablet 3  . ibuprofen (ADVIL,MOTRIN) 200 MG tablet Take 400 mg by mouth every 6 (six) hours as needed.    . pantoprazole (PROTONIX) 40 MG tablet Take 1 tablet (40 mg total) by mouth daily. 30 tablet 1  . polyethylene glycol (MIRALAX / GLYCOLAX) packet Take 17 g by mouth daily. 14 each 0  . senna-docusate (SENOKOT-S) 8.6-50 MG tablet Take 1 tablet by mouth daily. 30 tablet 0   No current facility-administered medications for this visit.      Objective:   Physical Exam BP 122/84   Pulse 93   Temp 98.1 F (36.7 C) (Oral)   Ht 5\' 3"  (1.6 m)   Wt 179 lb 12.8 oz (81.6 kg)   LMP 11/08/2017 (Exact Date)   SpO2 99%   BMI 31.85 kg/m  Gen:  Alert, cooperative patient who appears stated age in no acute distress.  Vital signs reviewed. HEENT: EOMI,  MMM.  No conjunctival or oral pallor noted.   Neck:  No LAD Cardiac:  Regular rate and rhythm  Pulm:  Clear to auscultation bilaterally with good air movement.   Abd:  Soft/nondistended/nontender.    Exts: Non edematous  BL  LE, warm and well perfused.   No results found for this or any previous visit (from the past 72 hour(s)).

## 2017-12-05 NOTE — Assessment & Plan Note (Signed)
After recent URI.   Likely sequelae to this.  Will need to FU to ensure either resolution or further w/u.   Seasonal allergies likely also contributing.

## 2017-12-06 LAB — COMPREHENSIVE METABOLIC PANEL
ALT: 23 IU/L (ref 0–32)
AST: 21 IU/L (ref 0–40)
Albumin/Globulin Ratio: 1.3 (ref 1.2–2.2)
Albumin: 4.2 g/dL (ref 3.5–5.5)
Alkaline Phosphatase: 92 IU/L (ref 39–117)
BUN/Creatinine Ratio: 14 (ref 9–23)
BUN: 8 mg/dL (ref 6–20)
Bilirubin Total: <0.2 mg/dL (ref 0.0–1.2)
CO2: 21 mmol/L (ref 20–29)
Calcium: 9.6 mg/dL (ref 8.7–10.2)
Chloride: 106 mmol/L (ref 96–106)
Creatinine, Ser: 0.59 mg/dL (ref 0.57–1.00)
GFR calc Af Amer: 139 mL/min/{1.73_m2} (ref >59)
GFR calc non Af Amer: 121 mL/min/{1.73_m2} (ref >59)
Globulin, Total: 3.3 g/dL (ref 1.5–4.5)
Glucose: 127 mg/dL — ABNORMAL HIGH (ref 65–99)
Potassium: 4.4 mmol/L (ref 3.5–5.2)
Sodium: 142 mmol/L (ref 134–144)
Total Protein: 7.5 g/dL (ref 6.0–8.5)

## 2017-12-06 LAB — CBC WITH DIFFERENTIAL/PLATELET
Basophils Absolute: 0 10*3/uL (ref 0.0–0.2)
Basos: 0 %
EOS (ABSOLUTE): 0.1 10*3/uL (ref 0.0–0.4)
Eos: 2 %
Hematocrit: 34.7 % (ref 34.0–46.6)
Hemoglobin: 11 g/dL — ABNORMAL LOW (ref 11.1–15.9)
Immature Grans (Abs): 0 10*3/uL (ref 0.0–0.1)
Immature Granulocytes: 0 %
Lymphocytes Absolute: 2 10*3/uL (ref 0.7–3.1)
Lymphs: 33 %
MCH: 22.3 pg — ABNORMAL LOW (ref 26.6–33.0)
MCHC: 31.7 g/dL (ref 31.5–35.7)
MCV: 70 fL — ABNORMAL LOW (ref 79–97)
Monocytes Absolute: 0.4 10*3/uL (ref 0.1–0.9)
Monocytes: 6 %
Neutrophils Absolute: 3.4 10*3/uL (ref 1.4–7.0)
Neutrophils: 59 %
Platelets: 382 10*3/uL — ABNORMAL HIGH (ref 150–379)
RBC: 4.94 x10E6/uL (ref 3.77–5.28)
RDW: 15.7 % — ABNORMAL HIGH (ref 12.3–15.4)
WBC: 5.9 10*3/uL (ref 3.4–10.8)

## 2017-12-06 LAB — IRON AND TIBC
Iron Saturation: 8 % — CL (ref 15–55)
Iron: 31 ug/dL (ref 27–159)
Total Iron Binding Capacity: 403 ug/dL (ref 250–450)
UIBC: 372 ug/dL (ref 131–425)

## 2017-12-06 LAB — LIPID PANEL
Chol/HDL Ratio: 3.6 ratio (ref 0.0–4.4)
Cholesterol, Total: 169 mg/dL (ref 100–199)
HDL: 47 mg/dL (ref >39)
LDL Calculated: 86 mg/dL (ref 0–99)
Triglycerides: 179 mg/dL — ABNORMAL HIGH (ref 0–149)
VLDL Cholesterol Cal: 36 mg/dL (ref 5–40)

## 2017-12-06 LAB — FERRITIN: Ferritin: 14 ng/mL — ABNORMAL LOW (ref 15–150)

## 2017-12-06 LAB — TSH: TSH: 2.33 u[IU]/mL (ref 0.450–4.500)

## 2017-12-21 ENCOUNTER — Encounter: Payer: Self-pay | Admitting: Family Medicine

## 2017-12-21 DIAGNOSIS — N924 Excessive bleeding in the premenopausal period: Secondary | ICD-10-CM

## 2018-01-12 ENCOUNTER — Encounter: Payer: Self-pay | Admitting: Obstetrics & Gynecology

## 2018-01-12 ENCOUNTER — Ambulatory Visit: Payer: BLUE CROSS/BLUE SHIELD | Admitting: Obstetrics & Gynecology

## 2018-01-12 VITALS — BP 126/84 | Ht 63.0 in | Wt 179.0 lb

## 2018-01-12 DIAGNOSIS — Z01419 Encounter for gynecological examination (general) (routine) without abnormal findings: Secondary | ICD-10-CM

## 2018-01-12 DIAGNOSIS — Z1151 Encounter for screening for human papillomavirus (HPV): Secondary | ICD-10-CM

## 2018-01-12 DIAGNOSIS — N920 Excessive and frequent menstruation with regular cycle: Secondary | ICD-10-CM

## 2018-01-12 DIAGNOSIS — Z3169 Encounter for other general counseling and advice on procreation: Secondary | ICD-10-CM

## 2018-01-12 DIAGNOSIS — Z113 Encounter for screening for infections with a predominantly sexual mode of transmission: Secondary | ICD-10-CM | POA: Diagnosis not present

## 2018-01-12 NOTE — Patient Instructions (Signed)
1. Encounter for routine gynecological examination with Papanicolaou smear of cervix Gynecologic exam status post clitorectomy.  Pap with high-risk HPV done.  Breast exam normal.  Health labs with family physician.  Encouraged to exercise regularly.  2. Encounter for preconception consultation Risks associated with 3 previous C-section discussed.  Patient wants to conceive nonetheless.  Recommend prenatal vitamins.  3. Menorrhagia with regular cycle Heavy periods with regular menstrual cycles.  Will follow up with a pelvic ultrasound to rule out endometrial polyps, sub-mucosal fibroid, endometrial hyperplasia and endometrial cancer.  TSH today to verify thyroid function.  Rule out anemia. - TSH - CBC - US Transvaginal Non-OB; Future  4. Screen for STD (sexually transmitted disease) - Gono-Chlam on pap - HIV antibody (with reflex) - RPR - Hepatitis C Antibody - Hepatitis B Surface AntiGEN  Cerise, it was a pleasure meeting you today!  I will inform you of your results as soon as they are available.  I will see you soon again for the pelvic ultrasound.  Health Maintenance, Female Adopting a healthy lifestyle and getting preventive care can go a long way to promote health and wellness. Talk with your health care provider about what schedule of regular examinations is right for you. This is a good chance for you to check in with your provider about disease prevention and staying healthy. In between checkups, there are plenty of things you can do on your own. Experts have done a lot of research about which lifestyle changes and preventive measures are most likely to keep you healthy. Ask your health care provider for more information. Weight and diet Eat a healthy diet  Be sure to include plenty of vegetables, fruits, low-fat dairy products, and lean protein.  Do not eat a lot of foods high in solid fats, added sugars, or salt.  Get regular exercise. This is one of the most important things  you can do for your health. ? Most adults should exercise for at least 150 minutes each week. The exercise should increase your heart rate and make you sweat (moderate-intensity exercise). ? Most adults should also do strengthening exercises at least twice a week. This is in addition to the moderate-intensity exercise.  Maintain a healthy weight  Body mass index (BMI) is a measurement that can be used to identify possible weight problems. It estimates body fat based on height and weight. Your health care provider can help determine your BMI and help you achieve or maintain a healthy weight.  For females 75 years of age and older: ? A BMI below 18.5 is considered underweight. ? A BMI of 18.5 to 24.9 is normal. ? A BMI of 25 to 29.9 is considered overweight. ? A BMI of 30 and above is considered obese.  Watch levels of cholesterol and blood lipids  You should start having your blood tested for lipids and cholesterol at 34 years of age, then have this test every 5 years.  You may need to have your cholesterol levels checked more often if: ? Your lipid or cholesterol levels are high. ? You are older than 34 years of age. ? You are at high risk for heart disease.  Cancer screening Lung Cancer  Lung cancer screening is recommended for adults 19-45 years old who are at high risk for lung cancer because of a history of smoking.  A yearly low-dose CT scan of the lungs is recommended for people who: ? Currently smoke. ? Have quit within the past 15 years. ? Have  at least a 30-pack-year history of smoking. A pack year is smoking an average of one pack of cigarettes a day for 1 year.  Yearly screening should continue until it has been 15 years since you quit.  Yearly screening should stop if you develop a health problem that would prevent you from having lung cancer treatment.  Breast Cancer  Practice breast self-awareness. This means understanding how your breasts normally appear and  feel.  It also means doing regular breast self-exams. Let your health care provider know about any changes, no matter how small.  If you are in your 20s or 30s, you should have a clinical breast exam (CBE) by a health care provider every 1-3 years as part of a regular health exam.  If you are 42 or older, have a CBE every year. Also consider having a breast X-ray (mammogram) every year.  If you have a family history of breast cancer, talk to your health care provider about genetic screening.  If you are at high risk for breast cancer, talk to your health care provider about having an MRI and a mammogram every year.  Breast cancer gene (BRCA) assessment is recommended for women who have family members with BRCA-related cancers. BRCA-related cancers include: ? Breast. ? Ovarian. ? Tubal. ? Peritoneal cancers.  Results of the assessment will determine the need for genetic counseling and BRCA1 and BRCA2 testing.  Cervical Cancer Your health care provider may recommend that you be screened regularly for cancer of the pelvic organs (ovaries, uterus, and vagina). This screening involves a pelvic examination, including checking for microscopic changes to the surface of your cervix (Pap test). You may be encouraged to have this screening done every 3 years, beginning at age 64.  For women ages 29-65, health care providers may recommend pelvic exams and Pap testing every 3 years, or they may recommend the Pap and pelvic exam, combined with testing for human papilloma virus (HPV), every 5 years. Some types of HPV increase your risk of cervical cancer. Testing for HPV may also be done on women of any age with unclear Pap test results.  Other health care providers may not recommend any screening for nonpregnant women who are considered low risk for pelvic cancer and who do not have symptoms. Ask your health care provider if a screening pelvic exam is right for you.  If you have had past treatment for  cervical cancer or a condition that could lead to cancer, you need Pap tests and screening for cancer for at least 20 years after your treatment. If Pap tests have been discontinued, your risk factors (such as having a new sexual partner) need to be reassessed to determine if screening should resume. Some women have medical problems that increase the chance of getting cervical cancer. In these cases, your health care provider may recommend more frequent screening and Pap tests.  Colorectal Cancer  This type of cancer can be detected and often prevented.  Routine colorectal cancer screening usually begins at 34 years of age and continues through 33 years of age.  Your health care provider may recommend screening at an earlier age if you have risk factors for colon cancer.  Your health care provider may also recommend using home test kits to check for hidden blood in the stool.  A small camera at the end of a tube can be used to examine your colon directly (sigmoidoscopy or colonoscopy). This is done to check for the earliest forms of colorectal cancer.  Routine screening usually begins at age 59.  Direct examination of the colon should be repeated every 5-10 years through 34 years of age. However, you may need to be screened more often if early forms of precancerous polyps or small growths are found.  Skin Cancer  Check your skin from head to toe regularly.  Tell your health care provider about any new moles or changes in moles, especially if there is a change in a mole's shape or color.  Also tell your health care provider if you have a mole that is larger than the size of a pencil eraser.  Always use sunscreen. Apply sunscreen liberally and repeatedly throughout the day.  Protect yourself by wearing long sleeves, pants, a wide-brimmed hat, and sunglasses whenever you are outside.  Heart disease, diabetes, and high blood pressure  High blood pressure causes heart disease and increases  the risk of stroke. High blood pressure is more likely to develop in: ? People who have blood pressure in the high end of the normal range (130-139/85-89 mm Hg). ? People who are overweight or obese. ? People who are African American.  If you are 60-13 years of age, have your blood pressure checked every 3-5 years. If you are 18 years of age or older, have your blood pressure checked every year. You should have your blood pressure measured twice-once when you are at a hospital or clinic, and once when you are not at a hospital or clinic. Record the average of the two measurements. To check your blood pressure when you are not at a hospital or clinic, you can use: ? An automated blood pressure machine at a pharmacy. ? A home blood pressure monitor.  If you are between 42 years and 69 years old, ask your health care provider if you should take aspirin to prevent strokes.  Have regular diabetes screenings. This involves taking a blood sample to check your fasting blood sugar level. ? If you are at a normal weight and have a low risk for diabetes, have this test once every three years after 34 years of age. ? If you are overweight and have a high risk for diabetes, consider being tested at a younger age or more often. Preventing infection Hepatitis B  If you have a higher risk for hepatitis B, you should be screened for this virus. You are considered at high risk for hepatitis B if: ? You were born in a country where hepatitis B is common. Ask your health care provider which countries are considered high risk. ? Your parents were born in a high-risk country, and you have not been immunized against hepatitis B (hepatitis B vaccine). ? You have HIV or AIDS. ? You use needles to inject street drugs. ? You live with someone who has hepatitis B. ? You have had sex with someone who has hepatitis B. ? You get hemodialysis treatment. ? You take certain medicines for conditions, including cancer, organ  transplantation, and autoimmune conditions.  Hepatitis C  Blood testing is recommended for: ? Everyone born from [birth year] through 1967. ? Anyone with known risk factors for hepatitis C.  Sexually transmitted infections (STIs)  You should be screened for sexually transmitted infections (STIs) including gonorrhea and chlamydia if: ? You are sexually active and are younger than 34 years of age. ? You are older than 34 years of age and your health care provider tells you that you are at risk for this type of infection. ? Your sexual activity  has changed since you were last screened and you are at an increased risk for chlamydia or gonorrhea. Ask your health care provider if you are at risk.  If you do not have HIV, but are at risk, it may be recommended that you take a prescription medicine daily to prevent HIV infection. This is called pre-exposure prophylaxis (PrEP). You are considered at risk if: ? You are sexually active and do not regularly use condoms or know the HIV status of your partner(s). ? You take drugs by injection. ? You are sexually active with a partner who has HIV.  Talk with your health care provider about whether you are at high risk of being infected with HIV. If you choose to begin PrEP, you should first be tested for HIV. You should then be tested every 3 months for as long as you are taking PrEP. Pregnancy  If you are premenopausal and you may become pregnant, ask your health care provider about preconception counseling.  If you may become pregnant, take 400 to 800 micrograms (mcg) of folic acid every day.  If you want to prevent pregnancy, talk to your health care provider about birth control (contraception). Osteoporosis and menopause  Osteoporosis is a disease in which the bones lose minerals and strength with aging. This can result in serious bone fractures. Your risk for osteoporosis can be identified using a bone density scan.  If you are 41 years of age or  older, or if you are at risk for osteoporosis and fractures, ask your health care provider if you should be screened.  Ask your health care provider whether you should take a calcium or vitamin D supplement to lower your risk for osteoporosis.  Menopause may have certain physical symptoms and risks.  Hormone replacement therapy may reduce some of these symptoms and risks. Talk to your health care provider about whether hormone replacement therapy is right for you. Follow these instructions at home:  Schedule regular health, dental, and eye exams.  Stay current with your immunizations.  Do not use any tobacco products including cigarettes, chewing tobacco, or electronic cigarettes.  If you are pregnant, do not drink alcohol.  If you are breastfeeding, limit how much and how often you drink alcohol.  Limit alcohol intake to no more than 1 drink per day for nonpregnant women. One drink equals 12 ounces of beer, 5 ounces of wine, or 1 ounces of hard liquor.  Do not use street drugs.  Do not share needles.  Ask your health care provider for help if you need support or information about quitting drugs.  Tell your health care provider if you often feel depressed.  Tell your health care provider if you have ever been abused or do not feel safe at home. This information is not intended to replace advice given to you by your health care provider. Make sure you discuss any questions you have with your health care provider. Document Released: 04/04/2011 Document Revised: 02/25/2016 Document Reviewed: 06/23/2015 Elsevier Interactive Patient Education  [employer].

## 2018-01-12 NOTE — Addendum Note (Signed)
Addended by: Berna SpareASTILLO, Anthem Frazer A on: 01/12/2018 12:41 PM   Modules accepted: Orders

## 2018-01-12 NOTE — Progress Notes (Signed)
[name] 1985/12/26 [id number]   History:    32 y.o. [id number]  Married.  C/S x 3.  Children 43 yo girl, 71 yo son and 55 yo son.  From [place], [place] interpret present.  RP:  New patient presenting for annual gyn exam   HPI: Heavy periods.  Menses every month lasting 7-8 days with 4 days of heavy flow.  Heavy periods for the last 2 years.  LMP 01/06/2018.  Would like to conceive.  No pelvic pain.  Normal vaginal secretions.  No pain with intercourse.  Urine and bowel movements normal.  Breasts normal.  BMI 31.71.  Not regularly physically active.  Health labs with family physician.  Past medical history,surgical history, family history and social history were all reviewed and documented in the EPIC chart.  Gynecologic History Patient's last menstrual period was 01/06/2018. Contraception: none Last Pap: 2017. Results were: normal Last mammogram: Never Bone Density: Never Colonoscopy: Never  Obstetric History OB History  Gravida Para Term Preterm AB Living  0 1 3  SAB TAB Ectopic Multiple Live Births  1 0 0 0 3    # Outcome Date GA Lbr Len/2nd Weight Sex Delivery Anes PTL Lv  4 Term 12/22/14 [redacted]w[redacted]d  7 lb 3.9 oz (3.285 kg) M CS-LTranv Spinal  LIV  3 Term 01/21/10    M CS-LTranv Spinal N LIV  2 Term 11/21/07 [redacted]w[redacted]d   F CS-LTranv Spinal N LIV     Complications: Failure to Progress in First Stage  1 SAB             Obstetric Comments  In [place]     ROS: A ROS was performed and pertinent positives and negatives are included in the history.  GENERAL: No fevers or chills. HEENT: No change in vision, no earache, sore throat or sinus congestion. NECK: No pain or stiffness. CARDIOVASCULAR: No chest pain or pressure. No palpitations. PULMONARY: No shortness of breath, cough or wheeze. GASTROINTESTINAL: No abdominal pain, nausea, vomiting or diarrhea, melena or bright red blood per rectum. GENITOURINARY: No urinary frequency, urgency, hesitancy or dysuria. MUSCULOSKELETAL: No joint or  muscle pain, no back pain, no recent trauma. DERMATOLOGIC: No rash, no itching, no lesions. ENDOCRINE: No polyuria, polydipsia, no heat or cold intolerance. No recent change in weight. HEMATOLOGICAL: No anemia or easy bruising or bleeding. NEUROLOGIC: No headache, seizures, numbness, tingling or weakness. PSYCHIATRIC: No depression, no loss of interest in normal activity or change in sleep pattern.     Exam:   Ht  (1.6 m)   Wt 179 lb (81.2 kg)   LMP 01/06/2018   BMI 31.71 kg/m   Body mass index is 31.71 kg/m.  General appearance : Well developed well nourished female. No acute distress HEENT: Eyes: no retinal hemorrhage or exudates,  Neck supple, trachea midline, no carotid bruits, no thyroidmegaly Lungs: Clear to auscultation, no rhonchi or wheezes, or rib retractions  Heart: Regular rate and rhythm, no murmurs or gallops Breast:Examined in sitting and supine position were symmetrical in appearance, no palpable masses or tenderness,  no skin retraction, no nipple inversion, no nipple discharge, no skin discoloration, no axillary or supraclavicular lymphadenopathy Abdomen: no palpable masses or tenderness, no rebound or guarding Extremities: no edema or skin discoloration or tenderness  Pelvic: Vulva: S/P Clitorectomy             Vagina: No gross lesions or discharge  Cervix: No gross lesions or discharge.  Pap/HR HPV done.  Gono-Chlam on Pap.  Uterus  AV, normal size, shape and consistency, non-tender and mobile  Adnexa  Without masses or tenderness  Anus: Normal   Assessment/Plan:  32 y.o. female for annual exam   1. Encounter for routine gynecological examination with Papanicolaou smear of cervix Gynecologic exam status post clitorectomy.  Pap with high-risk HPV done.  Breast exam normal.  Health labs with family physician.  Encouraged to exercise regularly.  2. Encounter for preconception consultation Risks associated with 3 previous C-section discussed.  Patient wants to  conceive nonetheless.  Recommend prenatal vitamins.  3. Menorrhagia with regular cycle Heavy periods with regular menstrual cycles.  Will follow up with a pelvic ultrasound to rule out endometrial polyps, sub-mucosal fibroid, endometrial hyperplasia and endometrial cancer.  TSH today to verify thyroid function.  Rule out anemia. - TSH - CBC - US Transvaginal Non-OB; Future  4. Screen for STD (sexually transmitted disease) - Gono-Chlam on pap - HIV antibody (with reflex) - RPR - Hepatitis C Antibody - Hepatitis B Surface AntiGEN  Counseling on above issues and coordination of care more than 50% of 10 minutes.  Genia Del MD, 10:42 AM 01/12/2018

## 2018-01-15 LAB — CBC
HCT: 33 % — ABNORMAL LOW (ref 35.0–45.0)
Hemoglobin: 10.2 g/dL — ABNORMAL LOW (ref 11.7–15.5)
MCH: 21.2 pg — ABNORMAL LOW (ref 27.0–33.0)
MCHC: 30.9 g/dL — ABNORMAL LOW (ref 32.0–36.0)
MCV: 68.6 fL — ABNORMAL LOW (ref 80.0–100.0)
MPV: 10.6 fL (ref 7.5–12.5)
Platelets: 462 10*3/uL — ABNORMAL HIGH (ref 140–400)
RBC: 4.81 10*6/uL (ref 3.80–5.10)
RDW: 15.6 % — ABNORMAL HIGH (ref 11.0–15.0)
WBC: 5.7 10*3/uL (ref 3.8–10.8)

## 2018-01-15 LAB — PAP IG, CT-NG NAA, HPV HIGH-RISK
C. trachomatis RNA, TMA: NOT DETECTED
HPV DNA High Risk: NOT DETECTED
N. gonorrhoeae RNA, TMA: NOT DETECTED

## 2018-01-15 LAB — RPR: RPR Ser Ql: NONREACTIVE

## 2018-01-15 LAB — HIV ANTIBODY (ROUTINE TESTING W REFLEX): HIV 1&2 Ab, 4th Generation: NONREACTIVE

## 2018-01-15 LAB — HEPATITIS C ANTIBODY
Hepatitis C Ab: NONREACTIVE
SIGNAL TO CUT-OFF: 0.03 (ref <1.00)

## 2018-01-15 LAB — HEPATITIS B SURFACE ANTIGEN: Hepatitis B Surface Ag: NONREACTIVE

## 2018-01-15 LAB — TSH: TSH: 2.57 mIU/L

## 2018-01-16 ENCOUNTER — Encounter: Payer: Self-pay | Admitting: *Deleted

## 2018-02-13 ENCOUNTER — Ambulatory Visit (INDEPENDENT_AMBULATORY_CARE_PROVIDER_SITE_OTHER): Payer: BLUE CROSS/BLUE SHIELD

## 2018-02-13 ENCOUNTER — Ambulatory Visit: Payer: BLUE CROSS/BLUE SHIELD | Admitting: Obstetrics & Gynecology

## 2018-02-13 ENCOUNTER — Encounter: Payer: Self-pay | Admitting: Obstetrics & Gynecology

## 2018-02-13 DIAGNOSIS — D649 Anemia, unspecified: Secondary | ICD-10-CM

## 2018-02-13 DIAGNOSIS — N920 Excessive and frequent menstruation with regular cycle: Secondary | ICD-10-CM

## 2018-02-13 MED ORDER — NORETHIN ACE-ETH ESTRAD-FE 1-20 MG-MCG(24) PO TABS: 1.0000 | ORAL_TABLET | Freq: Every day | ORAL | 4 refills | Status: DC

## 2018-02-13 NOTE — Patient Instructions (Addendum)
1. Menorrhagia with regular cycle Normal pelvic ultrasound with no fibroid or polyp seen.  The endometrium is try layered normal at 9.1 mm.  Ovaries normal.  Patient informed and reassured about ultrasound findings.  She was attempting conception, but given her tiredness probably secondary to anemia with a hemoglobin of 10.2, she would prefer to control the heavy menstrual periods for a while before reattempting conception.  2. Secondary anemia Hemoglobin 10.2 per last evaluation January 12, 2018.  Probably secondary to her heavy menstrual periods.  Decision to start on norethindrone acetate ethynyl estradiol FE 1/20 (24).  Usage, risks and benefits thoroughly reviewed with the patient.  Prescription sent to pharmacy.  Patient will start with next period and will use continuously to avoid her menstrual periods. Patient will continue with 2 tablets of iron sulfate daily in addition to iron rich nutrition.  Other orders - Norethindrone Acetate-Ethinyl Estrad-FE (LOESTRIN 24 FE) 1-20 MG-MCG(24) tablet; Take 1 tablet by mouth daily. Continuous use for Menorrhagia with secondary anemia.  Latriece, good seeing you today!

## 2018-02-13 NOTE — Progress Notes (Signed)
    [name] [birth year] [id number]        33 y.o.  [id number]  Married  RP: Menorrhagia for Pelvic US  HPI: Heavy menses every months.  No breakthrough bleeding.  Secondary anemia with hemoglobin at 10.2 January 12, 2018.  TSH was normal at the same date.  Patient was attempting conception, but feels very tired and would like to control the heavy bleedings before continuing with conception attempts.   OB History  Gravida Para Term Preterm AB Living  0 1 3  SAB TAB Ectopic Multiple Live Births  1 0 0 0 3    # Outcome Date GA Lbr Len/2nd Weight Sex Delivery Anes PTL Lv  4 Term 12/22/14 [redacted]w[redacted]d  7 lb 3.9 oz (3.285 kg) M CS-LTranv Spinal  LIV  3 Term 01/21/10    M CS-LTranv Spinal N LIV  2 Term 11/21/07 [redacted]w[redacted]d   F CS-LTranv Spinal N LIV     Complications: Failure to Progress in First Stage  1 SAB             Obstetric Comments  In [place]    Past medical history,surgical history, problem list, medications, allergies, family history and social history were all reviewed and documented in the EPIC chart.   Directed ROS with pertinent positives and negatives documented in the history of present illness/assessment and plan.  Exam:  There were no vitals filed for this visit. General appearance:  Normal  Pelvic US today: T/V and T/a images.  Anteverted uterus measuring 9.89 x 5.98 x 4.53 cm.  Tri-layered endometrium measuring 9.1 mm.  Previous C-section scars noted close to the cervix and at the mid body of the uterus.  Right and left ovaries normal.  No free fluid in the posterior cul-de-sac.  Labs 01/12/2018:  Hb 10.2 and TSH normal.   Assessment/Plan:  32 y.o. [id number]   1. Menorrhagia with regular cycle Normal pelvic ultrasound with no fibroid or polyp seen.  The endometrium is try layered normal at 9.1 mm.  Ovaries normal.  Patient informed and reassured about ultrasound findings.  She was attempting conception, but given her tiredness probably secondary to anemia with a  hemoglobin of 10.2, she would prefer to control the heavy menstrual periods for a while before reattempting conception.  2. Secondary anemia Hemoglobin 10.2 per last evaluation January 12, 2018.  Probably secondary to her heavy menstrual periods.  Decision to start on norethindrone acetate ethynyl estradiol FE 1/20 (24).  Usage, risks and benefits thoroughly reviewed with the patient.  Prescription sent to pharmacy.  Patient will start with next period and will use continuously to avoid her menstrual periods.  Patient will continue with 2 tablets of iron sulfate daily in addition to iron rich nutrition.  Other orders - Norethindrone Acetate-Ethinyl Estrad-FE (LOESTRIN 24 FE) 1-20 MG-MCG(24) tablet; Take 1 tablet by mouth daily. Continuous use for Menorrhagia with secondary anemia.  Counseling on above issues and coordination of care more than 50% for 15 minutes.  Genia Del MD, 11:14 AM 02/13/2018

## 2018-08-08 ENCOUNTER — Encounter: Payer: Self-pay | Admitting: Family Medicine

## 2018-08-08 ENCOUNTER — Other Ambulatory Visit: Payer: Self-pay

## 2018-08-08 ENCOUNTER — Ambulatory Visit: Payer: BLUE CROSS/BLUE SHIELD | Admitting: Family Medicine

## 2018-08-08 VITALS — BP 116/82 | HR 86 | Temp 98.3°F | Ht 63.0 in | Wt 179.2 lb

## 2018-08-08 DIAGNOSIS — R42 Dizziness and giddiness: Secondary | ICD-10-CM

## 2018-08-08 DIAGNOSIS — N920 Excessive and frequent menstruation with regular cycle: Secondary | ICD-10-CM | POA: Diagnosis not present

## 2018-08-08 DIAGNOSIS — R5383 Other fatigue: Secondary | ICD-10-CM

## 2018-08-08 LAB — POCT URINALYSIS DIP (MANUAL ENTRY)
Bilirubin, UA: NEGATIVE
Blood, UA: NEGATIVE
Glucose, UA: NEGATIVE mg/dL
Leukocytes, UA: NEGATIVE
Nitrite, UA: NEGATIVE
Protein Ur, POC: NEGATIVE mg/dL
Spec Grav, UA: >=1.03 — AB (ref 1.010–1.025)
Urobilinogen, UA: 0.2 E.U./dL
pH, UA: 6 (ref 5.0–8.0)

## 2018-08-08 LAB — POCT HEMOGLOBIN: Hemoglobin: 11.1 g/dL (ref 9.5–13.5)

## 2018-08-08 LAB — POCT CBG (FASTING - GLUCOSE)-MANUAL ENTRY: Glucose Fasting, POC: 131 mg/dL — AB (ref 70–99)

## 2018-08-08 LAB — POCT URINE PREGNANCY: Preg Test, Ur: NEGATIVE

## 2018-08-08 MED ORDER — POLYETHYLENE GLYCOL 3350 17 GM/SCOOP PO POWD: 17.0000 g | Freq: Two times a day (BID) | ORAL | 1 refills | Status: DC | PRN

## 2018-08-08 NOTE — Patient Instructions (Signed)
Through the weekend (next 4 days) take the iron three times a day.  Next week take it twice a day.  Then back down to once.    We are checking labs today as well.  Use the Miralax once in the AM and once in the PM for constipation.  You can also use colace stool softener over the counter once a day.    It was good to see you again today

## 2018-08-08 NOTE — Progress Notes (Signed)
Subjective:    [name] is a 32 y.o. female who presents to [facility] today for "head heaviness:"  1.  Dizziness:  Present for the past several weeks.  Better initially, but then she had heavy menses last week.  Felt it suddenly worsened.  Fell back onto bed after standing this week.  Has felt lightheaded almost every time she's stood.  No chest pain/no shortness of breath.  Menses has lightened but still (mildly) continues.  No palpitations.  Mild headaches.  No N/V/D.  No fevers/chills.  No cough.  No rash.    ROS as above per HPI.    The following portions of the patient's history were reviewed and updated as appropriate: allergies, current medications, past medical history, family and social history, and problem list. Patient is a nonsmoker.    PMH reviewed.  Past Medical History:  Diagnosis Date  . Anemia    Past Surgical History:  Procedure Laterality Date  . CESAREAN SECTION  2009; 2011; 2016   x 2 in [place]  . CESAREAN SECTION N/A 12/22/2014   Procedure: CESAREAN SECTION;  Surgeon: Tereso Newcomer, MD;  Location: [facility];  Service: Obstetrics;  Laterality: N/A;  . CHOLECYSTECTOMY N/A 01/31/2017   Procedure: LAPAROSCOPIC CHOLECYSTECTOMY WITH INTRAOPERATIVE CHOLANGIOGRAM; REPAIR OF UMBILICAL HERNIA;  Surgeon: Manus Rudd, MD;  Location: [facility] OR;  Service: General;  Laterality: N/A;  . ESOPHAGOGASTRODUODENOSCOPY N/A 01/29/2017   Procedure: ESOPHAGOGASTRODUODENOSCOPY (EGD);  Surgeon: Jeani Hawking, MD;  Location: [facility] END[facility]COPY;  Service: Endoscopy;  Laterality: N/A;    Medications reviewed. Current Outpatient Medications  Medication Sig Dispense Refill  . Norethindrone Acetate-Ethinyl Estrad-FE (LOESTRIN 24 FE) 1-20 MG-MCG(24) tablet Take 1 tablet by mouth daily. Continuous use for Menorrhagia with secondary anemia. 4 Package 4   No current facility-administered medications for this visit.      Objective:   Physical Exam BP 116/82   Pulse 86   Temp 98.3 F (36.8 C) (Oral)   Ht  5\' 3"  (1.6 m)   Wt 179 lb 3.2 oz (81.3 kg)   LMP 07/25/2018   SpO2 99%   BMI 31.74 kg/m  Gen:  Alert, cooperative patient who appears stated age in no acute distress.  Vital signs reviewed. HEENT: EOMI.  No nystagmus.  MMM Cardiac:  Regular rate and rhythm  Pulm:  Clear to auscultation bilaterally  Abd:  Soft/nondistended/nontender.  Good bowel sounds throughout all four quadrants.  No masses noted.  Exts: Non edematous BL  LE, warm and well perfused.  Neuro:  CN II-XII intact.  No focal upper/lower extremity focal deficits.

## 2018-08-09 LAB — COMPREHENSIVE METABOLIC PANEL
ALT: 28 IU/L (ref 0–32)
AST: 19 IU/L (ref 0–40)
Albumin/Globulin Ratio: 1.3 (ref 1.2–2.2)
Albumin: 4.2 g/dL (ref 3.5–5.5)
Alkaline Phosphatase: 79 IU/L (ref 39–117)
BUN/Creatinine Ratio: 17 (ref 9–23)
BUN: 10 mg/dL (ref 6–20)
Bilirubin Total: <0.2 mg/dL (ref 0.0–1.2)
CO2: 24 mmol/L (ref 20–29)
Calcium: 9.5 mg/dL (ref 8.7–10.2)
Chloride: 105 mmol/L (ref 96–106)
Creatinine, Ser: 0.6 mg/dL (ref 0.57–1.00)
GFR calc Af Amer: 138 mL/min/{1.73_m2} (ref >59)
GFR calc non Af Amer: 119 mL/min/{1.73_m2} (ref >59)
Globulin, Total: 3.2 g/dL (ref 1.5–4.5)
Glucose: 132 mg/dL — ABNORMAL HIGH (ref 65–99)
Potassium: 4.2 mmol/L (ref 3.5–5.2)
Sodium: 142 mmol/L (ref 134–144)
Total Protein: 7.4 g/dL (ref 6.0–8.5)

## 2018-08-09 LAB — TSH: TSH: 1.5 u[IU]/mL (ref 0.450–4.500)

## 2018-08-09 LAB — CBC WITH DIFFERENTIAL/PLATELET
Basophils Absolute: 0 10*3/uL (ref 0.0–0.2)
Basos: 0 %
EOS (ABSOLUTE): 0.1 10*3/uL (ref 0.0–0.4)
Eos: 2 %
Hematocrit: 37.5 % (ref 34.0–46.6)
Hemoglobin: 11.6 g/dL (ref 11.1–15.9)
Immature Grans (Abs): 0 10*3/uL (ref 0.0–0.1)
Immature Granulocytes: 0 %
Lymphocytes Absolute: 2.1 10*3/uL (ref 0.7–3.1)
Lymphs: 39 %
MCH: 22.9 pg — ABNORMAL LOW (ref 26.6–33.0)
MCHC: 30.9 g/dL — ABNORMAL LOW (ref 31.5–35.7)
MCV: 74 fL — ABNORMAL LOW (ref 79–97)
Monocytes Absolute: 0.4 10*3/uL (ref 0.1–0.9)
Monocytes: 8 %
Neutrophils Absolute: 2.7 10*3/uL (ref 1.4–7.0)
Neutrophils: 51 %
Platelets: 408 10*3/uL (ref 150–450)
RBC: 5.06 x10E6/uL (ref 3.77–5.28)
RDW: 14.4 % (ref 12.3–15.4)
WBC: 5.3 10*3/uL (ref 3.4–10.8)

## 2018-08-09 LAB — IRON AND TIBC
Iron Saturation: 13 % — ABNORMAL LOW (ref 15–55)
Iron: 45 ug/dL (ref 27–159)
Total Iron Binding Capacity: 356 ug/dL (ref 250–450)
UIBC: 311 ug/dL (ref 131–425)

## 2018-08-09 LAB — FERRITIN: Ferritin: 34 ng/mL (ref 15–150)

## 2018-08-10 ENCOUNTER — Encounter: Payer: Self-pay | Admitting: Family Medicine

## 2018-08-10 DIAGNOSIS — R42 Dizziness and giddiness: Secondary | ICD-10-CM | POA: Insufficient documentation

## 2018-08-10 NOTE — Assessment & Plan Note (Signed)
Sounds mostly like orthostasis.  Rechecking iron levels today as well as CBC. CBG normal here.  No evidence of pregnancy or U/A.  - Plan will be to check labs and call patient with response.  No evidence of vertigo.  Likely secondary to anemia and acute blood loss.

## 2018-08-10 NOTE — Assessment & Plan Note (Signed)
Persists.  Likely contributing to orthostatic hypotension.

## 2018-08-28 ENCOUNTER — Encounter: Payer: Self-pay | Admitting: Family Medicine

## 2018-08-29 ENCOUNTER — Ambulatory Visit (INDEPENDENT_AMBULATORY_CARE_PROVIDER_SITE_OTHER): Payer: BLUE CROSS/BLUE SHIELD | Admitting: Family Medicine

## 2018-08-29 ENCOUNTER — Encounter: Payer: Self-pay | Admitting: Family Medicine

## 2018-08-29 ENCOUNTER — Other Ambulatory Visit: Payer: Self-pay

## 2018-08-29 VITALS — BP 108/74 | HR 89 | Temp 98.1°F | Ht 63.0 in | Wt 177.8 lb

## 2018-08-29 DIAGNOSIS — Z23 Encounter for immunization: Secondary | ICD-10-CM

## 2018-08-29 DIAGNOSIS — L819 Disorder of pigmentation, unspecified: Secondary | ICD-10-CM

## 2018-08-29 DIAGNOSIS — R5383 Other fatigue: Secondary | ICD-10-CM

## 2018-08-29 DIAGNOSIS — N920 Excessive and frequent menstruation with regular cycle: Secondary | ICD-10-CM

## 2018-08-29 DIAGNOSIS — R42 Dizziness and giddiness: Secondary | ICD-10-CM

## 2018-08-29 HISTORY — DX: Disorder of pigmentation, unspecified: L81.9

## 2018-08-29 NOTE — Patient Instructions (Signed)
It was good to see you again.  I think you have sleep apnea.  The best way to know this for sure is a sleep study.  Let me know sometime in the next year when you would like to schedule this.  I will refer you to to dermatology for your lighter skin.  Have a good Thanksgiving

## 2018-08-29 NOTE — Assessment & Plan Note (Signed)
Much improved this visit. -Did discuss increasing fluid intake to prevent lightheadedness and dehydration in the future.

## 2018-08-29 NOTE — Progress Notes (Signed)
Subjective:    [name] is a 32 y.o. female who presents to [facility] today for FU for dizziness:  1.  Dizziness:  Much improved.  No further issues with this.  Concentrated urine on UA last visit.  Likely secondary to dehydration.  We did discuss increasing her fluid intake.  She was previously only drinking 2 to 3 glasses of water per day.  No other fluid intake.  2.  Fatigue:  She had mentioned this last visit, but it was less an issue than the dizziness.  Now the fatigue is her main issue.  She describes fatigue worse in the morning.  It usually hits her after she drops her children off at school.  She goes home and has to lay down on the bed for 15 to 20 minutes before feeling refreshed and starting her day.  She states this happens every morning.  She says she sleeps well during the night but only sleeps about 6 to 7 hours per night.  She has long-standing history with past to 3 years of loud snoring according to other people in the house.  No actual apneic episodes that she knows about.  3.  Lightening of skin:  New issue.  Around her mouth.  She noticed this after [place].  She is embarrassed by this and would like to know if there is anything to do about it.  Has tried over-the-counter lotions without relief.   Prev health:  Currently overdue for flu shot.  ROS as above per HPI.  Pertinently, no chest pain, palpitations, SOB, Fever, Chills, Abd pain, N/V/D.   The following portions of the patient's history were reviewed and updated as appropriate: allergies, current medications, past medical history, family and social history, and problem list. Patient is a nonsmoker.    PMH reviewed.  Past Medical History:  Diagnosis Date  . Anemia    Past Surgical History:  Procedure Laterality Date  . CESAREAN SECTION  2009; 2011; 2016   x 2 in [place]  . CESAREAN SECTION N/A 12/22/2014   Procedure: CESAREAN SECTION;  Surgeon: Tereso NewcomerUgonna A Anyanwu, MD;  Location: [facility];  Service:  Obstetrics;  Laterality: N/A;  . CHOLECYSTECTOMY N/A 01/31/2017   Procedure: LAPAROSCOPIC CHOLECYSTECTOMY WITH INTRAOPERATIVE CHOLANGIOGRAM; REPAIR OF UMBILICAL HERNIA;  Surgeon: Manus RuddMatthew Tsuei, MD;  Location: [facility] OR;  Service: General;  Laterality: N/A;  . ESOPHAGOGASTRODUODENOSCOPY N/A 01/29/2017   Procedure: ESOPHAGOGASTRODUODENOSCOPY (EGD);  Surgeon: Jeani HawkingPatrick Hung, MD;  [facility];  Service: Endoscopy;  Laterality: N/A;    Medications reviewed. Current Outpatient Medications  Medication Sig Dispense Refill  . Norethindrone Acetate-Ethinyl Estrad-FE (LOESTRIN 24 FE) 1-20 MG-MCG(24) tablet Take 1 tablet by mouth daily. Continuous use for Menorrhagia with secondary anemia. 4 Package 4  . polyethylene glycol powder (GLYCOLAX/MIRALAX) powder Take 17 g by mouth 2 (two) times daily as needed. 3350 g 1   No current facility-administered medications for this visit.      Objective:   Physical Exam BP 108/74   Pulse 89   Temp 98.1 F (36.7 C) (Oral)   Ht 5\' 3"  (1.6 m)   Wt 177 lb 12.8 oz (80.6 kg)   LMP 08/23/2018 (Exact Date)   SpO2 99%   BMI 31.50 kg/m  Gen:  Alert, cooperative patient who appears stated age in no acute distress.  Vital signs reviewed. HEENT: EOMI,  MMM Cardiac:  Regular rate and rhythm  Pulm:  Clear to auscultation bilaterally with good air movement.  No  wheezes or rales noted.   Skin: Hypopigmentation noted full circumference around buccal mucosa/lips.  No other skin changes noted.     No results found for this or any previous visit (from the past 72 hour(s)).

## 2018-08-29 NOTE — Assessment & Plan Note (Signed)
Sounds like possibly secondary to sleep apnea.  We did discuss obtaining a sleep study test but she is very hesitant about this.  Does not want a sleep in a clinical setting.  However once we discussed that this is not an urgent or emergent issue and that she could schedule this sometime in the upcoming months she was more open.  She will get back with me after the new year to schedule her sleep study.

## 2018-08-29 NOTE — Assessment & Plan Note (Signed)
Bleeding has been better.  Do not think this is related to her fatigue.  Both her iron and hemoglobin levels were normal last check.

## 2018-08-29 NOTE — Assessment & Plan Note (Signed)
Noted only around her lips.  No real evidence of vitiligo.  Unclear if there is much that will help with this.  Patient is requesting dermatology referral.  Will place this today for further evaluation and recommendations.

## 2019-10-09 ENCOUNTER — Ambulatory Visit: Payer: 59 | Attending: Internal Medicine

## 2019-10-09 ENCOUNTER — Ambulatory Visit: Payer: Self-pay | Attending: Internal Medicine

## 2019-10-09 DIAGNOSIS — Z20822 Contact with and (suspected) exposure to covid-19: Secondary | ICD-10-CM

## 2019-10-09 DIAGNOSIS — U071 COVID-19: Secondary | ICD-10-CM | POA: Insufficient documentation

## 2019-10-10 ENCOUNTER — Encounter: Payer: Self-pay | Admitting: Family Medicine

## 2019-10-10 ENCOUNTER — Telehealth (INDEPENDENT_AMBULATORY_CARE_PROVIDER_SITE_OTHER): Payer: 59 | Admitting: Family Medicine

## 2019-10-10 ENCOUNTER — Other Ambulatory Visit: Payer: Self-pay

## 2019-10-10 DIAGNOSIS — R17 Unspecified jaundice: Secondary | ICD-10-CM | POA: Diagnosis not present

## 2019-10-10 DIAGNOSIS — Z20822 Contact with and (suspected) exposure to covid-19: Secondary | ICD-10-CM

## 2019-10-10 LAB — NOVEL CORONAVIRUS, NAA: SARS-CoV-2, NAA: DETECTED — AB

## 2019-10-10 NOTE — Progress Notes (Signed)
[facility] Telemedicine Visit  Patient consented to have virtual visit. Method of visit: Telephone  Encounter participants: Patient: [name] - located at patient's home address  Provider: Nicki Guadalajara - located at provider's home address  Others (if applicable): none  Chief Complaint: ear pain, sore throat and spitting up blood   HPI:  Ear Pain, Sore Throat and Hemoptysis [name] states that she began having symptoms 1 week ago. She reports  having pain in her ears (right ear>left) and her throat.Patient reports feeling swollen lymph nodes in neck.  Patient reports having some discharge from her ears. As far as her throat pain, it has been painful constantly on both sides of her throat. She also has decreased appetite, nausea, does not have sense of taste, has hemoptysis and is concerned whether this is coming from her lungs or from her throat. She quantifies the amount of blood as less than 1 teaspoon in her sputum. Patient endorses no pain with taking a deep breath.   Pain in RUQ with Emesis Started with fever, emesis and abdominal pain. Fever has been gone since last Tuesday, did not measure temperature but reports sweating. Patient last had emesis today, denies any blood in emesis but vomitus appears yellow and green. Patient reports having pain on the right side of her stomach that bothers her after eating.This pain feels like it's near her liver and gallbladder. She had gallbladder removed 3 years ago. She reports that her eyes appear yellow.    Patient reports being in close contact with   Patient has no thermometer to check temp now.   Patient states she has not tried to take any medications. She has family members who may be able to bring medications.   Denies: chest pain or chest tightness, denies that skin appears jaundiced endorses right sided abdominal pain, sclera appear yellow    ROS: per HPI  Pertinent PMHx: cholecystectomy 3 years  ago  Exam:  Respiratory: speaking in full sentences, no signs of respiratory distress  Abdomen: tenderness to palpation in RUQ   Assessment/Plan:  Scleral icterus Patient reports scleral icterus and severe RUQ abdominal pain with emesis for 1 week and inability to tolerate PO. Patient has hx of cholecystectomy in 2018. Most concerning on differential diagnosis is sepsis 2/2 to bile leak from site of cholecystectomy, hepatic inflammation, choledocholithiasis or ascending cholangitis.  -patient will benefit from evaluation and physical exam with vitals -recommended that patient be evaluated in the ED ASAP for CMP and abdominal imaging  -patient is in agreement with this plan  -patient currently awaiting results of covid 19 test, encouraged to wear mask and shield with frequent hand washing   Encounter by telehealth for suspected COVID-19 Patient with symptoms concerning for infection with covid 19.  -covid test results pending  -patient encouraged to continue consuming fluids as tolerated, patient has been nauseated and unable to eat for ~1 week -patient encouraged to visit ED for concern of scleral icterus and potential infection near site of gallbladder removal    Time spent during visit with patient: 29 minutes

## 2019-10-10 NOTE — Assessment & Plan Note (Signed)
Patient reports scleral icterus and severe RUQ abdominal pain with emesis for 1 week and inability to tolerate PO. Patient has hx of cholecystectomy in 2018. Most concerning on differential diagnosis is sepsis 2/2 to bile leak from site of cholecystectomy, hepatic inflammation, choledocholithiasis or ascending cholangitis.  -patient will benefit from evaluation and physical exam with vitals -recommended that patient be evaluated in the ED ASAP for CMP and abdominal imaging  -patient is in agreement with this plan  -patient currently awaiting results of covid 19 test, encouraged to wear mask and shield with frequent hand washing

## 2019-10-10 NOTE — Assessment & Plan Note (Signed)
Patient with symptoms concerning for infection with covid 19.  -covid test results pending  -patient encouraged to continue consuming fluids as tolerated, patient has been nauseated and unable to eat for ~1 week -patient encouraged to visit ED for concern of scleral icterus and potential infection near site of gallbladder removal

## 2019-10-16 ENCOUNTER — Ambulatory Visit: Payer: 59 | Attending: Internal Medicine

## 2019-10-16 DIAGNOSIS — U071 COVID-19: Secondary | ICD-10-CM | POA: Insufficient documentation

## 2019-10-16 DIAGNOSIS — Z20822 Contact with and (suspected) exposure to covid-19: Secondary | ICD-10-CM

## 2019-10-18 LAB — NOVEL CORONAVIRUS, NAA: SARS-CoV-2, NAA: DETECTED — AB

## 2019-11-20 ENCOUNTER — Encounter: Payer: Self-pay | Admitting: Family Medicine

## 2019-11-20 ENCOUNTER — Ambulatory Visit (INDEPENDENT_AMBULATORY_CARE_PROVIDER_SITE_OTHER): Payer: 59 | Admitting: Family Medicine

## 2019-11-20 ENCOUNTER — Other Ambulatory Visit: Payer: Self-pay

## 2019-11-20 DIAGNOSIS — R609 Edema, unspecified: Secondary | ICD-10-CM

## 2019-11-20 DIAGNOSIS — L819 Disorder of pigmentation, unspecified: Secondary | ICD-10-CM

## 2019-11-20 DIAGNOSIS — R17 Unspecified jaundice: Secondary | ICD-10-CM

## 2019-11-20 DIAGNOSIS — R1013 Epigastric pain: Secondary | ICD-10-CM

## 2019-11-20 NOTE — Assessment & Plan Note (Signed)
Not very apparent on physical.  Will check labs for anemia and renal liver dysfunction

## 2019-11-20 NOTE — Assessment & Plan Note (Signed)
Mild and seems to have been present for years.  No further work up or treatment unless she desires

## 2019-11-20 NOTE — Assessment & Plan Note (Signed)
Non apparent on exam.  Check labs

## 2019-11-20 NOTE — Progress Notes (Signed)
   CHIEF COMPLAINT / HPI:  R UPPER QUADRANT PAIN On and off for unsure period of time.  Reported jaundice on last telemed visit none now.   Reports having had her GB removed.  No fever or rectal bleeding  LIMB SWELLING Diffuse hand and leg swelling worse at end of the day.  Happens on and off for variable period of time  VOMITING BLOOD When was diagnosed with covid one month ago had several episodes of what she believes is vomiting not coughing up blood.  Small amount with none for last few weeks No other bleeding.  Has normal every month menstrual periods .   No contraception   PERTINENT  PMH / PSH: History of anemia microcytic    OBJECTIVE: BP 110/72   Pulse (!) 102   Wt 183 lb 12.8 oz (83.4 kg)   SpO2 98%   BMI 32.56 kg/m   Appears health no distress Heart - Regular rate and rhythm.  No murmurs, gallops or rubs.    Lungs:  Normal respiratory effort, chest expands symmetrically. Lungs are clear to auscultation, no crackles or wheezes. Extremities:  No cyanosis, or deformity noted with good range of motion of all major joints.   No edema appreciated Skin:  Intact without suspicious lesions or rashes Slight lightening of skin around upper and lower lips Abdomen: soft  without masses, organomegaly or hernias noted. Mild pain in RUQ under her ribs No guarding or rebound Sclera - no apparent jaundice    ASSESSMENT / PLAN:  Edema Not very apparent on physical.  Will check labs for anemia and renal liver dysfunction   Hypopigmentation Mild and seems to have been present for years.  No further work up or treatment unless she desires  Pain in the abdomen RUQ nonacute, seems mild by description and exam.  Check CMET.  History of cholecystectomy?   Scleral icterus Non apparent on exam.  Check labs      Carney Living, MD [facility]

## 2019-11-20 NOTE — Assessment & Plan Note (Signed)
RUQ nonacute, seems mild by description and exam.  Check CMET.  History of cholecystectomy?

## 2019-11-20 NOTE — Patient Instructions (Signed)
Good to see you today!  Thanks for coming in.  I will call you if your lab tests are not normal.  Otherwise we will discuss them at your next visit.  Come back in 2-3 weeks to to go over your lab tests  If you have any severe pain or large amount of bleeding let us know immediately

## 2019-11-21 ENCOUNTER — Encounter: Payer: Self-pay | Admitting: Family Medicine

## 2019-11-21 LAB — CMP14+EGFR
ALT: 21 IU/L (ref 0–32)
AST: 19 IU/L (ref 0–40)
Albumin/Globulin Ratio: 1.1 — ABNORMAL LOW (ref 1.2–2.2)
Albumin: 4 g/dL (ref 3.8–4.8)
Alkaline Phosphatase: 83 IU/L (ref 39–117)
BUN/Creatinine Ratio: 15 (ref 9–23)
BUN: 9 mg/dL (ref 6–20)
Bilirubin Total: <0.2 mg/dL (ref 0.0–1.2)
CO2: 18 mmol/L — ABNORMAL LOW (ref 20–29)
Calcium: 9.8 mg/dL (ref 8.7–10.2)
Chloride: 104 mmol/L (ref 96–106)
Creatinine, Ser: 0.6 mg/dL (ref 0.57–1.00)
GFR calc Af Amer: 137 mL/min/{1.73_m2} (ref >59)
GFR calc non Af Amer: 118 mL/min/{1.73_m2} (ref >59)
Globulin, Total: 3.5 g/dL (ref 1.5–4.5)
Glucose: 136 mg/dL — ABNORMAL HIGH (ref 65–99)
Potassium: 4.3 mmol/L (ref 3.5–5.2)
Sodium: 139 mmol/L (ref 134–144)
Total Protein: 7.5 g/dL (ref 6.0–8.5)

## 2019-11-21 LAB — LIPASE: Lipase: 32 U/L (ref 14–72)

## 2019-11-21 LAB — CBC
Hematocrit: 37.3 % (ref 34.0–46.6)
Hemoglobin: 11.5 g/dL (ref 11.1–15.9)
MCH: 22.8 pg — ABNORMAL LOW (ref 26.6–33.0)
MCHC: 30.8 g/dL — ABNORMAL LOW (ref 31.5–35.7)
MCV: 74 fL — ABNORMAL LOW (ref 79–97)
Platelets: 405 10*3/uL (ref 150–450)
RBC: 5.04 x10E6/uL (ref 3.77–5.28)
RDW: 15.4 % (ref 11.7–15.4)
WBC: 5.9 10*3/uL (ref 3.4–10.8)

## 2019-11-21 LAB — TSH: TSH: 2.17 u[IU]/mL (ref 0.450–4.500)

## 2019-12-11 ENCOUNTER — Ambulatory Visit (INDEPENDENT_AMBULATORY_CARE_PROVIDER_SITE_OTHER): Payer: 59 | Admitting: Family Medicine

## 2019-12-11 ENCOUNTER — Encounter: Payer: Self-pay | Admitting: Family Medicine

## 2019-12-11 ENCOUNTER — Other Ambulatory Visit: Payer: Self-pay

## 2019-12-11 DIAGNOSIS — R1013 Epigastric pain: Secondary | ICD-10-CM

## 2019-12-11 DIAGNOSIS — R718 Other abnormality of red blood cells: Secondary | ICD-10-CM | POA: Diagnosis not present

## 2019-12-11 DIAGNOSIS — R609 Edema, unspecified: Secondary | ICD-10-CM | POA: Diagnosis not present

## 2019-12-11 NOTE — Assessment & Plan Note (Signed)
Improved. No red flags.  Will start regular metamucil to aid with preventing constipation

## 2019-12-11 NOTE — Assessment & Plan Note (Signed)
Normal labs.  Much improved.  No further work up

## 2019-12-11 NOTE — Assessment & Plan Note (Signed)
With normal hgb and low ferritin in past.  Continue regular iron every other day

## 2019-12-11 NOTE — Progress Notes (Signed)
   CHIEF COMPLAINT / HPI:  R UPPER QUADRANT PAIN Continues on and off for unsure period of time.  Seems worse with after eating and when constipated.  No nausea and vomiting bleeding or weight loss  LIMB SWELLING This has improved  LOW MCV Taking iron daily.     PERTINENT  PMH / PSH: History of anemia microcytic and choley    OBJECTIVE: BP 110/72   Pulse (!) 102   Wt 183 lb 12.8 oz (83.4 kg)   SpO2 98%   BMI 32.56 kg/m   Psych:  Cognition and judgment appear intact. Alert, communicative  and cooperative with normal attention span and concentration. No apparent delusions, illusions, hallucinations   ASSESSMENT / PLAN: Edema Normal labs.  Much improved.  No further work up   Pain in the abdomen Improved. No red flags.  Will start regular metamucil to aid with preventing constipation  Low mean corpuscular volume (MCV) With normal hgb and low ferritin in past.  Continue regular iron every other day

## 2019-12-11 NOTE — Patient Instructions (Addendum)
Good to see you today!  Thanks for coming in.  Your liver and blood count is normal  I would use one tablespoon of Metamucil in a large glass of water regularly  If you have severe pain or vomiting or bleeding you should come back  Continue to exercise  Come back in 6 months for a check up  Ask at front desk for next available SDA appointment with a female physician

## 2019-12-13 ENCOUNTER — Encounter: Payer: Self-pay | Admitting: Family Medicine

## 2019-12-13 ENCOUNTER — Other Ambulatory Visit (HOSPITAL_COMMUNITY)
Admission: RE | Admit: 2019-12-13 | Discharge: 2019-12-13 | Disposition: A | Discharge status: Home / Self Care | Payer: 59 | Source: Ambulatory Visit | Attending: Family Medicine | Admitting: Family Medicine

## 2019-12-13 ENCOUNTER — Ambulatory Visit (INDEPENDENT_AMBULATORY_CARE_PROVIDER_SITE_OTHER): Payer: 59 | Admitting: Family Medicine

## 2019-12-13 ENCOUNTER — Other Ambulatory Visit: Payer: Self-pay

## 2019-12-13 VITALS — BP 98/72 | HR 98 | Wt 180.8 lb

## 2019-12-13 DIAGNOSIS — N898 Other specified noninflammatory disorders of vagina: Secondary | ICD-10-CM

## 2019-12-13 DIAGNOSIS — Z319 Encounter for procreative management, unspecified: Secondary | ICD-10-CM

## 2019-12-13 DIAGNOSIS — B9689 Other specified bacterial agents as the cause of diseases classified elsewhere: Secondary | ICD-10-CM

## 2019-12-13 DIAGNOSIS — N760 Acute vaginitis: Secondary | ICD-10-CM

## 2019-12-13 LAB — POCT WET PREP (WET MOUNT)
Clue Cells Wet Prep Whiff POC: NEGATIVE
Trichomonas Wet Prep HPF POC: ABSENT

## 2019-12-13 MED ORDER — METRONIDAZOLE 500 MG PO TABS: 500.0000 mg | ORAL_TABLET | Freq: Two times a day (BID) | ORAL | 0 refills | Status: DC

## 2019-12-13 MED ORDER — PRENATAL VITAMIN AND MINERAL 28-0.8 MG PO TABS: 1.0000 | ORAL_TABLET | Freq: Every day | ORAL | 12 refills | Status: DC

## 2019-12-13 NOTE — Progress Notes (Signed)
    SUBJECTIVE:   CHIEF COMPLAINT / HPI:  Arabic interpretor used throughout encounter  Vaginal itching Patient presenting with concerns of vaginal itching. States that it began 1 month ago. Denies any vaginal discharge. Does report fishy odor. Reports she is sexually active with 1 partner. Denies exposure to STDs. Denies current pregnancy but her and her husband are actively trying. No PNV use. Last unprotected intercourse 1 day ago. Not breastfeeding.   PERTINENT  PMH / PSH: h/o female genital mutliation with clitorectomy, language barrier  OBJECTIVE:   BP 98/72   Pulse 98   Wt 180 lb 12.8 oz (82 kg)   LMP 12/01/2019 (Exact Date)   SpO2 100%   BMI 32.03 kg/m   Gen: awake and alert, NAD Female genitalia: Vulva: normal appearing vulva with no masses, tenderness or lesions Vagina: normal appearing vagina with normal color and discharge, no lesions Cervix: normal appearing cervix without discharge or lesions Uterus: uterus is normal size, shape, consistency and nontender Adnexa: normal adnexa in size, nontender and no masses Thin, grey dc noted  ASSESSMENT/PLAN:   Bacterial vaginosis Symptoms and PE consistent with bacterial vaginosis. Wet prep neg for whiff and clue cells, however, did show leukocytes and bacteria. Will plan to treat with metronidazole. F/u if no improvement  Desire for pregnancy PNV started     Oralia Manis, DO  PGY-3 [facility]

## 2019-12-13 NOTE — Assessment & Plan Note (Signed)
Symptoms and PE consistent with bacterial vaginosis. Wet prep neg for whiff and clue cells, however, did show leukocytes and bacteria. Will plan to treat with metronidazole. F/u if no improvement

## 2019-12-13 NOTE — Patient Instructions (Signed)

## 2019-12-13 NOTE — Assessment & Plan Note (Signed)
PNV started

## 2019-12-16 LAB — CERVICOVAGINAL ANCILLARY ONLY
Chlamydia: NEGATIVE
Comment: NEGATIVE
Comment: NORMAL
Neisseria Gonorrhea: NEGATIVE

## 2019-12-17 ENCOUNTER — Telehealth: Payer: Self-pay | Admitting: *Deleted

## 2019-12-17 NOTE — Telephone Encounter (Signed)
LMOVM informing pt of negative results. Maple Odaniel Bruna Potter, CMA

## 2019-12-17 NOTE — Telephone Encounter (Signed)
-----   Message from Oralia Manis, DO sent at 12/16/2019  2:12 PM EDT ----- Please inform patient that results of GC/chlamydia are negative.

## 2020-02-18 ENCOUNTER — Encounter: Payer: Self-pay | Admitting: Family Medicine

## 2020-02-18 ENCOUNTER — Ambulatory Visit (INDEPENDENT_AMBULATORY_CARE_PROVIDER_SITE_OTHER): Payer: 59 | Admitting: Family Medicine

## 2020-02-18 ENCOUNTER — Other Ambulatory Visit: Payer: Self-pay

## 2020-02-18 DIAGNOSIS — R1013 Epigastric pain: Secondary | ICD-10-CM | POA: Diagnosis not present

## 2020-02-18 DIAGNOSIS — Z30019 Encounter for initial prescription of contraceptives, unspecified: Secondary | ICD-10-CM | POA: Diagnosis not present

## 2020-02-18 DIAGNOSIS — R739 Hyperglycemia, unspecified: Secondary | ICD-10-CM | POA: Diagnosis not present

## 2020-02-18 DIAGNOSIS — R718 Other abnormality of red blood cells: Secondary | ICD-10-CM

## 2020-02-18 DIAGNOSIS — Z7184 Encounter for health counseling related to travel: Secondary | ICD-10-CM

## 2020-02-18 DIAGNOSIS — E119 Type 2 diabetes mellitus without complications: Secondary | ICD-10-CM | POA: Insufficient documentation

## 2020-02-18 DIAGNOSIS — R7303 Prediabetes: Secondary | ICD-10-CM | POA: Insufficient documentation

## 2020-02-18 LAB — POCT GLYCOSYLATED HEMOGLOBIN (HGB A1C): Hemoglobin A1C: 6 % — AB (ref 4.0–5.6)

## 2020-02-18 MED ORDER — NORGESTIMATE-ETH ESTRADIOL 0.25-35 MG-MCG PO TABS: 1.0000 | ORAL_TABLET | Freq: Every day | ORAL | 1 refills | Status: DC

## 2020-02-18 MED ORDER — ATOVAQUONE-PROGUANIL HCL 250-100 MG PO TABS: 1.0000 | ORAL_TABLET | Freq: Every day | ORAL | 0 refills | Status: DC

## 2020-02-18 NOTE — Progress Notes (Signed)
    SUBJECTIVE:   CHIEF COMPLAINT / HPI:   ABDOMINAL PAIN Has had more diffuse mid abdominal pain especially when ate large meal afer fasting for Ramadan.  Still has intermittent RUQ pain.  No weight loss or nausea and vomiting or bleeding.  Taking miralax 1-2 x per day.  Stopped metamucil.  Taking iron for low MCV  LOW MCV  And ferritin.  Taking iron tab qod most of the time.  No bleeding.  Does have history of heavy menstrual periods   CONTRACEPTION Does not wish to have periods or become pregnant when traveling.  Wishes to take continuous bcps to prevent period.  No smoking or history of migraines   TRAVEL TO [place] Will travel to [place] for 3 months then end of May.  Wishes malaria prophylaxis.  Has never taken before but has had malaria several times.  No known reactions to treatments.  No know liver renal disease  PERTINENT  PMH / PSH: Choley around 2018  OBJECTIVE:   BP 112/72   Pulse 100   Ht 5\' 3"  (1.6 m)   Wt 186 lb 6.4 oz (84.6 kg)   SpO2 98%   BMI 33.02 kg/m   Abdomen - mild diffuse discomfort with palpation. No masses or rebound guarding No CVAT No palpable edema   ASSESSMENT/PLAN:   Hyperglycemia Will check A1c  Low mean corpuscular volume (MCV) Probable iron deficiency.  Has been on iron.  Will check cbc and ferritin   Pain in the abdomen Semi chronic.  No red flags on history for cancer or focal organ disease.  Gaining weight.  Will monitor   Contraception management Will give continuous oral BCPs to prevent period while traveling.  No contraindications   Travel advice encounter Will use malarone for prophylaxis.  She will be on contraception while traveling.  See letter to her husband for immunization advice      , MD [facility]

## 2020-02-18 NOTE — Assessment & Plan Note (Signed)
Will give continuous oral BCPs to prevent period while traveling.  No contraindications

## 2020-02-18 NOTE — Assessment & Plan Note (Deleted)
Will give continuous oral BCPs to prevent period while traveling.  No contraindications  

## 2020-02-18 NOTE — Assessment & Plan Note (Signed)
Semi chronic.  No red flags on history for cancer or focal organ disease.  Gaining weight.  Will monitor

## 2020-02-18 NOTE — Patient Instructions (Addendum)
Good to see you today!  Thanks for coming in.  To Control your Periods - start the birth control pill at the end of your next period. Take one pill every day the same time.  Skip taking the last 7 pills that are a different color and continue taking the next pack   To prevent malaria - Malaria, prophylaxis: Oral: 1 tablet (atovaquone 250 mg/proguanil 100 mg per tablet) once daily; start 1 to 2 days prior to entering a malaria-endemic area, continue throughout the stay and for 7 days after returning   For the low blood - I will check a test today and will send you a message.  Hopefully you can stop taking iron   For the abdomen pain - try to eat regular smaller meals  Will also check a diabetes test and will let you know the result  Have a good trip to [place]

## 2020-02-18 NOTE — Assessment & Plan Note (Signed)
Probable iron deficiency.  Has been on iron.  Will check cbc and ferritin

## 2020-02-18 NOTE — Assessment & Plan Note (Signed)
Will use malarone for prophylaxis.  She will be on contraception while traveling.  See letter to her husband for immunization advice

## 2020-02-18 NOTE — Assessment & Plan Note (Signed)
Will check A1c

## 2020-02-19 ENCOUNTER — Encounter: Payer: Self-pay | Admitting: Family Medicine

## 2020-02-19 LAB — CBC
Hematocrit: 37 % (ref 34.0–46.6)
Hemoglobin: 11.6 g/dL (ref 11.1–15.9)
MCH: 23 pg — ABNORMAL LOW (ref 26.6–33.0)
MCHC: 31.4 g/dL — ABNORMAL LOW (ref 31.5–35.7)
MCV: 73 fL — ABNORMAL LOW (ref 79–97)
Platelets: 370 10*3/uL (ref 150–450)
RBC: 5.05 x10E6/uL (ref 3.77–5.28)
RDW: 16.1 % — ABNORMAL HIGH (ref 11.7–15.4)
WBC: 5.8 10*3/uL (ref 3.4–10.8)

## 2020-02-19 LAB — FERRITIN: Ferritin: 28 ng/mL (ref 15–150)

## 2020-07-08 ENCOUNTER — Encounter: Payer: Self-pay | Admitting: Family Medicine

## 2020-07-08 ENCOUNTER — Other Ambulatory Visit: Payer: Self-pay

## 2020-07-08 ENCOUNTER — Ambulatory Visit (INDEPENDENT_AMBULATORY_CARE_PROVIDER_SITE_OTHER): Payer: 59 | Admitting: Family Medicine

## 2020-07-08 VITALS — BP 110/80 | HR 83 | Ht 64.72 in | Wt 176.8 lb

## 2020-07-08 DIAGNOSIS — R7303 Prediabetes: Secondary | ICD-10-CM

## 2020-07-08 DIAGNOSIS — R634 Abnormal weight loss: Secondary | ICD-10-CM

## 2020-07-08 DIAGNOSIS — R739 Hyperglycemia, unspecified: Secondary | ICD-10-CM | POA: Diagnosis not present

## 2020-07-08 LAB — POCT GLYCOSYLATED HEMOGLOBIN (HGB A1C): HbA1c, POC (controlled diabetic range): 5.8 % (ref 0.0–7.0)

## 2020-07-08 NOTE — Assessment & Plan Note (Signed)
With associated nausea and with history of intermittent abdomen pain and apparently recent malaria while in [place].   No obvious cause on physical and nonacute abdomen.  Will check labs looking for anemia, liver abnormalities.  Will check Upreg next visit if persists (currently on her menstrual period)

## 2020-07-08 NOTE — Progress Notes (Signed)
    SUBJECTIVE:   CHIEF COMPLAINT / HPI:   NAUSEA Complaining of nausea with one episode of vomiting for the last few weeks.  Does not feel is eating as much.  Currently having her expected menstrual period.  Mild diffuse abdomen pain perhaps more on the R side.   Did not take BCPs when in [place].  Her weight measures 10 lb less than last visit in May.  No fever or GI bleeding  MALARIA Was treated for malaria when in [place] a few weeks ago.  Unsure of medication.  Did not take prophylaxis of malranone  PERTINENT  PMH / PSH: history of intermittent abdomen pain.  Is desiring to get pregnant.  Not taking vitamins  OBJECTIVE:   BP 110/80   Pulse 83   Ht 5' 4.72" (1.644 m)   Wt 176 lb 12.8 oz (80.2 kg)   LMP 07/03/2020 (Exact Date)   SpO2 100%   BMI 29.67 kg/m   No distress Mobility:able to get up and down from exam table without assistance or distress Abdomen - mild diffuse tenderness without guarding or rebound No edema Heart - Regular rate and rhythm.  No murmurs, gallops or rubs.    Lungs:  Normal respiratory effort, chest expands symmetrically. Lungs are clear to auscultation, no crackles or wheezes.   ASSESSMENT/PLAN:   Weight loss With associated nausea and with history of intermittent abdomen pain and apparently recent malaria while in [place].   No obvious cause on physical and nonacute abdomen.  Will check labs looking for anemia, liver abnormalities.  Will check Upreg next visit if persists (currently on her menstrual period)   Prediabetes Stable A1c     Carney Living, MD [facility]

## 2020-07-08 NOTE — Patient Instructions (Signed)
Good to see you today!  Thanks for coming in.  We will do blood tests that hopefully will help to find why you have nausea  Your diabetes is doing well   Come back in 3 weeks to check your weight and go over the blood tests  Take general multivitamin (over the counter) every day

## 2020-07-09 LAB — CMP14+EGFR
ALT: 16 IU/L (ref 0–32)
AST: 16 IU/L (ref 0–40)
Albumin/Globulin Ratio: 1.1 — ABNORMAL LOW (ref 1.2–2.2)
Albumin: 4.1 g/dL (ref 3.8–4.8)
Alkaline Phosphatase: 85 IU/L (ref 44–121)
BUN/Creatinine Ratio: 18 (ref 9–23)
BUN: 11 mg/dL (ref 6–20)
Bilirubin Total: <0.2 mg/dL (ref 0.0–1.2)
CO2: 21 mmol/L (ref 20–29)
Calcium: 9.2 mg/dL (ref 8.7–10.2)
Chloride: 102 mmol/L (ref 96–106)
Creatinine, Ser: 0.6 mg/dL (ref 0.57–1.00)
GFR calc Af Amer: 136 mL/min/{1.73_m2} (ref >59)
GFR calc non Af Amer: 118 mL/min/{1.73_m2} (ref >59)
Globulin, Total: 3.6 g/dL (ref 1.5–4.5)
Glucose: 103 mg/dL — ABNORMAL HIGH (ref 65–99)
Potassium: 4.4 mmol/L (ref 3.5–5.2)
Sodium: 138 mmol/L (ref 134–144)
Total Protein: 7.7 g/dL (ref 6.0–8.5)

## 2020-07-09 LAB — CBC WITH DIFFERENTIAL/PLATELET
Basophils Absolute: 0 10*3/uL (ref 0.0–0.2)
Basos: 1 %
EOS (ABSOLUTE): 0.1 10*3/uL (ref 0.0–0.4)
Eos: 2 %
Hematocrit: 34.7 % (ref 34.0–46.6)
Hemoglobin: 10.6 g/dL — ABNORMAL LOW (ref 11.1–15.9)
Immature Grans (Abs): 0 10*3/uL (ref 0.0–0.1)
Immature Granulocytes: 0 %
Lymphocytes Absolute: 2.4 10*3/uL (ref 0.7–3.1)
Lymphs: 45 %
MCH: 22.4 pg — ABNORMAL LOW (ref 26.6–33.0)
MCHC: 30.5 g/dL — ABNORMAL LOW (ref 31.5–35.7)
MCV: 73 fL — ABNORMAL LOW (ref 79–97)
Monocytes Absolute: 0.3 10*3/uL (ref 0.1–0.9)
Monocytes: 6 %
Neutrophils Absolute: 2.6 10*3/uL (ref 1.4–7.0)
Neutrophils: 46 %
Platelets: 388 10*3/uL (ref 150–450)
RBC: 4.74 x10E6/uL (ref 3.77–5.28)
RDW: 15.7 % — ABNORMAL HIGH (ref 11.7–15.4)
WBC: 5.4 10*3/uL (ref 3.4–10.8)

## 2020-07-09 LAB — TSH: TSH: 2.9 u[IU]/mL (ref 0.450–4.500)

## 2020-07-09 LAB — SEDIMENTATION RATE: Sed Rate: 47 mm/hr — ABNORMAL HIGH (ref 0–32)

## 2020-07-09 NOTE — Assessment & Plan Note (Signed)
Stable A1c.

## 2020-07-29 ENCOUNTER — Encounter: Payer: Self-pay | Admitting: Family Medicine

## 2020-07-29 ENCOUNTER — Ambulatory Visit (INDEPENDENT_AMBULATORY_CARE_PROVIDER_SITE_OTHER): Payer: 59 | Admitting: Family Medicine

## 2020-07-29 ENCOUNTER — Other Ambulatory Visit: Payer: Self-pay

## 2020-07-29 VITALS — BP 132/84 | HR 80 | Wt 179.6 lb

## 2020-07-29 DIAGNOSIS — Z23 Encounter for immunization: Secondary | ICD-10-CM

## 2020-07-29 DIAGNOSIS — R109 Unspecified abdominal pain: Secondary | ICD-10-CM

## 2020-07-29 DIAGNOSIS — R634 Abnormal weight loss: Secondary | ICD-10-CM

## 2020-07-29 DIAGNOSIS — Z319 Encounter for procreative management, unspecified: Secondary | ICD-10-CM | POA: Diagnosis not present

## 2020-07-29 DIAGNOSIS — R718 Other abnormality of red blood cells: Secondary | ICD-10-CM

## 2020-07-29 LAB — POCT URINALYSIS DIP (MANUAL ENTRY)
Bilirubin, UA: NEGATIVE
Blood, UA: NEGATIVE
Glucose, UA: NEGATIVE mg/dL
Ketones, POC UA: NEGATIVE mg/dL
Leukocytes, UA: NEGATIVE
Nitrite, UA: NEGATIVE
Protein Ur, POC: NEGATIVE mg/dL
Spec Grav, UA: >=1.03 — AB (ref 1.010–1.025)
Urobilinogen, UA: 0.2 E.U./dL
pH, UA: 5 (ref 5.0–8.0)

## 2020-07-29 LAB — POCT URINE PREGNANCY: Preg Test, Ur: NEGATIVE

## 2020-07-29 NOTE — Progress Notes (Signed)
   Covid-19 Vaccination Clinic  Name:  [name]    MRN: [id number] DOB: [birth year]  07/29/2020  Ms. Needle was observed post Covid-19 immunization for 15 minutes without incident. She was provided with Vaccine Information Sheet and instruction to access the V-Safe system.   [name] was instructed to call 911 with any severe reactions post vaccine: Marland Kitchen Difficulty breathing  . Swelling of face and throat  . A fast heartbeat  . A bad rash all over body  . Dizziness and weakness

## 2020-07-29 NOTE — Assessment & Plan Note (Signed)
Found Halal multivit at [employer]

## 2020-07-29 NOTE — Assessment & Plan Note (Signed)
Likely due to iron def at least partially.  Replete iron and follow

## 2020-07-29 NOTE — Progress Notes (Signed)
    SUBJECTIVE:   CHIEF COMPLAINT / HPI:   NAUSEA ABDOMEN PAIN Has improved since last visit.  Only mild and occasional.  No vomiting or bleeding or weight loss.  ANEMIA No lightheadness.  Not taking iron as causes stomach upset.    FERTILITY Not taking bcps as can't find ones without gluten that are Halal    PERTINENT  PMH / PSH: UTD on PAP smear.  ESR was 47 on 10/6   OBJECTIVE:   BP 132/84   Pulse 80   Wt 179 lb 9.6 oz (81.5 kg)   LMP 07/03/2020 (Exact Date)   SpO2 99%   BMI 30.14 kg/m   Good spirits Mouth - no lesions, mucous membranes are moist, no decaying teeth   Ears:  External ear exam shows no significant lesions or deformities.  Otoscopic examination reveals clear canal on L with wax moderate in R, tympanic membranes are intact bilaterally without bulging, retraction, inflammation or discharge. Hearing is grossly normal bilaterall  ASSESSMENT/PLAN:   Desire for pregnancy Found Halal multivit at [facility]   Low mean corpuscular volume (MCV) Likely due to iron def at least partially.  Replete iron and follow   Pain in the abdomen Much improved.  Will monitor  Weight loss Resolved.  May have been due to international travel.  Lab work up was normal      Lind Covert, MD [place]

## 2020-07-29 NOTE — Assessment & Plan Note (Signed)
Much improved.  Will monitor

## 2020-07-29 NOTE — Patient Instructions (Signed)
Good to see you today!  Thanks for coming in.  Take one every day  Halal Multivitamins For Adults  90 Gummies, 45 Days Supply w/ All Essential Vitamins  Vegetarian, NON-GMO, Gluten Gelatin Peanuts Free  Folic Acid, Zinc, C A D E B-6 B-12 Biotin  SHIFAA NUTRITION  Iron tablet take 1/2 every day  Avoid large amounts of fat  For your ear get an Ear wax softner and bulb syringe to wash out Do that daily for 4 days  For the lip feelings try the vitamin every day for several months  30 minutes every day of walking

## 2020-07-29 NOTE — Assessment & Plan Note (Signed)
Resolved.  May have been due to international travel.  Lab work up was normal

## 2020-08-19 ENCOUNTER — Ambulatory Visit (INDEPENDENT_AMBULATORY_CARE_PROVIDER_SITE_OTHER): Payer: 59

## 2020-08-19 ENCOUNTER — Other Ambulatory Visit: Payer: Self-pay

## 2020-08-19 DIAGNOSIS — Z23 Encounter for immunization: Secondary | ICD-10-CM | POA: Diagnosis not present

## 2020-08-19 NOTE — Progress Notes (Signed)
   Covid-19 Vaccination Clinic  Name:  [name]    MRN: [id number] DOB: April 07, 1986  08/19/2020   Patient presents to nurse clinic for second COVID vaccination. Patient denies history of allergic reaction and answers no to all screening questions. Administered in LD, site unremarkable, tolerated injection well.   Ms. Rencher was observed post Covid-19 immunization for 15 minutes without incident. She was provided with Vaccine Information Sheet and instruction to access the V-Safe system.   Ms. Lezotte was instructed to call 911 with any severe reactions post vaccine: Marland Kitchen Difficulty breathing  . Swelling of face and throat  . A fast heartbeat  . A bad rash all over body  . Dizziness and weakness  Provided patient with updated immunization card and record.   Veronda Prude, RN

## 2020-09-02 ENCOUNTER — Ambulatory Visit (INDEPENDENT_AMBULATORY_CARE_PROVIDER_SITE_OTHER): Payer: 59 | Admitting: Family Medicine

## 2020-09-02 ENCOUNTER — Other Ambulatory Visit: Payer: Self-pay

## 2020-09-02 ENCOUNTER — Encounter: Payer: Self-pay | Admitting: Family Medicine

## 2020-09-02 VITALS — BP 101/70 | HR 96 | Ht 64.96 in | Wt 181.4 lb

## 2020-09-02 DIAGNOSIS — Z3200 Encounter for pregnancy test, result unknown: Secondary | ICD-10-CM | POA: Diagnosis not present

## 2020-09-02 DIAGNOSIS — H538 Other visual disturbances: Secondary | ICD-10-CM | POA: Diagnosis not present

## 2020-09-02 DIAGNOSIS — Z319 Encounter for procreative management, unspecified: Secondary | ICD-10-CM | POA: Diagnosis not present

## 2020-09-02 DIAGNOSIS — H539 Unspecified visual disturbance: Secondary | ICD-10-CM | POA: Insufficient documentation

## 2020-09-02 LAB — POCT URINE PREGNANCY: Preg Test, Ur: POSITIVE — AB

## 2020-09-02 NOTE — Progress Notes (Signed)
    SUBJECTIVE:   CHIEF COMPLAINT / HPI:   PREGNANT? Hopes she is pregnant.  LMP Oct 28.  Prior was Oct 1.  A little trouble determining period interval using the interpreter but I'm pretty sure reports regular periods every 3 weeks lasting 6-7 days.  Has been taking prenatal vits and iron.  Often feels mildly nauseous .  Last vomited 3 days ago.  No vaginal bleeding   History of 4 prior pregnancies - 3 live children one miscarriage at 2 mo.  Did have gestational diabetes diet controlled with last pregnancy   VISION - complains of trouble seeing both eyes at time.  No pain or redness or trauma and has been present for unknown time  PERTINENT  PMH / PSH: she and husband are happy about pregnancy   OBJECTIVE:   BP 101/70   Pulse 96   Ht 5' 4.96" (1.65 m)   Wt 181 lb 6.4 oz (82.3 kg)   LMP 07/30/2020 (Exact Date)   SpO2 100%   BMI 30.22 kg/m   Weight increased 2 lbs since Oct 27  PERRL EOMI  ASSESSMENT/PLAN:   Desire for pregnancy Positive pregnancy test.  Will check pregnancy labs and urine culture.  Continue prenatal vits and iron.  Set up for initial ob visit.  I am not super confident in her dates so likely needs dating [place]  Vision disturbance Of unsure duration.   Recommend follow up with optometry for full exam.       Carney Living, MD [facility]

## 2020-09-02 NOTE — Patient Instructions (Addendum)
Congratulations  We will set you up for a new pregnancy visit  I will call you if your lab tests are not normal.  Otherwise will discuss them at your next visit.  I would go to have an eye check with an optometrist  Please schedule for a new OB visit

## 2020-09-02 NOTE — Assessment & Plan Note (Signed)
Of unsure duration.   Recommend follow up with optometry for full exam.

## 2020-09-02 NOTE — Assessment & Plan Note (Addendum)
Positive pregnancy test.  Will check pregnancy labs and urine culture.  Continue prenatal vits and iron.  Set up for initial ob visit.  I am not super confident in her dates so likely needs dating [place]

## 2020-09-03 LAB — OBSTETRIC PANEL, INCLUDING HIV
Antibody Screen: NEGATIVE
Basophils Absolute: 0.1 10*3/uL (ref 0.0–0.2)
Basos: 1 %
EOS (ABSOLUTE): 0.1 10*3/uL (ref 0.0–0.4)
Eos: 2 %
HIV Screen 4th Generation wRfx: NONREACTIVE
Hematocrit: 34.5 % (ref 34.0–46.6)
Hemoglobin: 10.7 g/dL — ABNORMAL LOW (ref 11.1–15.9)
Hepatitis B Surface Ag: NEGATIVE
Immature Grans (Abs): 0 10*3/uL (ref 0.0–0.1)
Immature Granulocytes: 0 %
Lymphocytes Absolute: 2.4 10*3/uL (ref 0.7–3.1)
Lymphs: 41 %
MCH: 21.8 pg — ABNORMAL LOW (ref 26.6–33.0)
MCHC: 31 g/dL — ABNORMAL LOW (ref 31.5–35.7)
MCV: 70 fL — ABNORMAL LOW (ref 79–97)
Monocytes Absolute: 0.3 10*3/uL (ref 0.1–0.9)
Monocytes: 6 %
Neutrophils Absolute: 3.1 10*3/uL (ref 1.4–7.0)
Neutrophils: 50 %
Platelets: 471 10*3/uL — ABNORMAL HIGH (ref 150–450)
RBC: 4.91 x10E6/uL (ref 3.77–5.28)
RDW: 15.4 % (ref 11.7–15.4)
RPR Ser Ql: NONREACTIVE
Rh Factor: POSITIVE
Rubella Antibodies, IGG: <0.9 index — ABNORMAL LOW (ref >0.99)
WBC: 6 10*3/uL (ref 3.4–10.8)

## 2020-09-04 LAB — URINE CULTURE, OB REFLEX

## 2020-09-04 LAB — CULTURE, OB URINE

## 2020-09-15 ENCOUNTER — Encounter: Payer: Self-pay | Admitting: Family Medicine

## 2020-09-22 ENCOUNTER — Telehealth: Payer: Self-pay | Admitting: Family Medicine

## 2020-09-22 ENCOUNTER — Other Ambulatory Visit: Payer: Self-pay | Admitting: Family Medicine

## 2020-09-22 DIAGNOSIS — O219 Vomiting of pregnancy, unspecified: Secondary | ICD-10-CM

## 2020-09-22 MED ORDER — DOXYLAMINE-PYRIDOXINE 10-10 MG PO TBEC: 1.0000 | DELAYED_RELEASE_TABLET | ORAL | 1 refills | Status: DC

## 2020-09-22 NOTE — Telephone Encounter (Signed)
Patient presented to clinic with her daughter, she notes her LMP was mid-October and new OB visit scheduled for next week, but she is having continued nausea and vomiting. She is able to drink some. Pulse 83 bpm, SpO2 98%. respirations non-labored. Given printed script for Diclegis, instructed to call clinic if unable to afford, or if intractable nausea and vomiting. Instructed to start one tab qPM, and after 3 days if still nauseous can increase to two tabs in the evening. Has appt scheduled on 12/28 that she is aware of with Dr Salvadore Dom for new OB.  Burley Saver MD

## 2020-09-29 ENCOUNTER — Encounter: Payer: Self-pay | Admitting: Family Medicine

## 2020-09-29 ENCOUNTER — Other Ambulatory Visit: Payer: Self-pay

## 2020-09-29 ENCOUNTER — Other Ambulatory Visit (HOSPITAL_COMMUNITY)
Admission: RE | Admit: 2020-09-29 | Discharge: 2020-09-29 | Disposition: A | Discharge status: Home / Self Care | Payer: 59 | Source: Ambulatory Visit | Attending: Family Medicine | Admitting: Family Medicine

## 2020-09-29 ENCOUNTER — Ambulatory Visit (INDEPENDENT_AMBULATORY_CARE_PROVIDER_SITE_OTHER): Payer: 59 | Admitting: Family Medicine

## 2020-09-29 VITALS — BP 130/80 | HR 90 | Wt 173.4 lb

## 2020-09-29 DIAGNOSIS — O09521 Supervision of elderly multigravida, first trimester: Secondary | ICD-10-CM

## 2020-09-29 DIAGNOSIS — Z3490 Encounter for supervision of normal pregnancy, unspecified, unspecified trimester: Secondary | ICD-10-CM | POA: Insufficient documentation

## 2020-09-29 DIAGNOSIS — Z8632 Personal history of gestational diabetes: Secondary | ICD-10-CM

## 2020-09-29 DIAGNOSIS — O09529 Supervision of elderly multigravida, unspecified trimester: Secondary | ICD-10-CM | POA: Diagnosis not present

## 2020-09-29 DIAGNOSIS — Z3A09 9 weeks gestation of pregnancy: Secondary | ICD-10-CM | POA: Diagnosis not present

## 2020-09-29 DIAGNOSIS — O09291 Supervision of pregnancy with other poor reproductive or obstetric history, first trimester: Secondary | ICD-10-CM

## 2020-09-29 DIAGNOSIS — R7309 Other abnormal glucose: Secondary | ICD-10-CM

## 2020-09-29 LAB — POCT UA - MICROSCOPIC ONLY

## 2020-09-29 LAB — POCT URINALYSIS DIP (MANUAL ENTRY)
Blood, UA: NEGATIVE
Glucose, UA: NEGATIVE mg/dL
Ketones, POC UA: NEGATIVE mg/dL
Leukocytes, UA: NEGATIVE
Nitrite, UA: NEGATIVE
Protein Ur, POC: 30 mg/dL — AB
Spec Grav, UA: >=1.03 — AB (ref 1.010–1.025)
Urobilinogen, UA: 0.2 E.U./dL
pH, UA: 5.5 (ref 5.0–8.0)

## 2020-09-29 LAB — POCT GLYCOSYLATED HEMOGLOBIN (HGB A1C): Hemoglobin A1C: 5.8 % — AB (ref 4.0–5.6)

## 2020-09-29 LAB — POCT 1 HR PRENATAL GLUCOSE: Glucose 1 Hr Prenatal, POC: 216 mg/dL

## 2020-09-29 NOTE — Progress Notes (Signed)
Patient Name: [name] Date of Birth: 12/16/1985 [facility] Initial Prenatal Visit  [name] is a 34 y.o. year old [phone] at Unknown who presents for her initial prenatal visit. Pregnancy is planned She reports morning sickness and nausea. She is not taking a prenatal vitamin.  She denies pelvic pain or vaginal bleeding.   Pregnancy Dating: . The patient is dated by LMP.  . LMP: 07/30/20 . Period is certain:  Yes.  . Periods were regular:  Yes.  Marland Kitchen LMP was a typical period:  Yes.  . Using hormonal contraception in 3 months prior to conception: No  Lab Review: . Blood type: A . Rh Status: + . Antibody screen: Negative . HIV: Negative . RPR: Negative . Hemoglobin electrophoresis reviewed: No . Results of OB urine culture are: Negative . Rubella: Not immune . Hep C Ab: Not done . Varicella status is Immune; patient with varicella in the past  PMH: Reviewed and as detailed below: . HTN: No  . Gestational Hypertension/preeclampsia: No  . Type 1 or 2 Diabetes: No  . Depression:  No  . Seizure disorder:  No . VTE: No ,  . History of STI No,  . Abnormal Pap smear:  No, . Genital herpes simplex:  No   PSH: . Gynecologic Surgery: Hx of 3 prior C-section, FGM w/ clitorectomy . Surgical history reviewed, notable for: Cholecystectomy (2018)  Obstetric History: . Obstetric history tab updated and reviewed.  . Summary of prior pregnancies: 3 prior c/s full term births, 1 SAB . Cesarean delivery: Yes x3 . Gestational Diabetes:  Yes last pregnancy . Hypertension in pregnancy: No . History of preterm birth: No . History of LGA/SGA infant:  No . History of shoulder dystocia: No . Indications for referral were reviewed, and the patient has no obstetric indications for referral to High Risk OB Clinic at this time.   Social History: . Partner's name: Khalaf allah  . Tobacco use: No . Alcohol use:  No . Other substance use:  No  Current Medications:   . PNV, diclegis  . Reviewed and appropriate in pregnancy.   Genetic and Infection Screen: . Flow Sheet Updated Yes  Prenatal Exam: Gen: Well nourished, well developed.  No distress.  Vitals noted. HEENT: Normocephalic, atraumatic.  Neck supple without cervical lymphadenopathy, thyromegaly or thyroid nodules.  Fair dentition. CV: RRR no murmur, gallops or rubs Lungs: CTA B.  Normal respiratory effort without wheezes or rales. Abd: soft, NTND. +BS.  Uterus not appreciated above pelvis. GU: Visualized FGM. external female genitalia without lesions.  Nl vaginal, well rugated without lesions. No vaginal discharge.  Bimanual exam: No adnexal mass or TTP. No CMT.  Uterus size normal Ext: No clubbing, cyanosis or edema. Psych: Normal grooming and dress.  Not depressed or anxious appearing.  Normal thought content and process without flight of ideas or looseness of associations  Fetal heart tones: Not obtained during this visit*  Assessment/Plan:  [name] is a 34 y.o. [phone] at [redacted]w[redacted]d by LMP who presents to initiate prenatal care. She is doing well.  Current pregnancy issues include nausea/vomiting.  1. Routine prenatal care: Marland Kitchen As dating is reliable, a dating ultrasound has not been ordered. Dating tab updated. . Pre-pregnancy weight updated. Expected weight gain this pregnancy is 25-35 pounds  . Prenatal labs reviewed, notable for hgb 10.7, rubella non-immune. . Indications for referral to HROB were reviewed and the patient does not meet criteria for referral.  .  Medication list reviewed and updated.  . Recommended patient see a dentist for regular care.  . Bleeding and pain precautions reviewed. . Importance of prenatal vitamins reviewed.  . Genetic screening offered. Patient opted for: first trimester screen with nuchal translucency at 11-13 weeks. . The patient does not have an indication for aspirin therapy beginning at 12-16 weeks. Aspirin was not  recommended today.  . The  patient will be age 68 or over at time of delivery. Referral to genetic counseling was offered today.  . The patient has the following risk factors for preexisting diabetes: BMI >25 and history of GDM . An early 1 hour glucose tolerance test was ordered. . Pregnancy Medical Home and PHQ-9 forms completed, problems noted: No  2. Pregnancy issues include the following which were addressed today:   Multigravida of advanced maternal age in first trimester  Patient will be 35 at time of delivery. Discussed referral and genetic screening. It is desired by patient. Scheduled during this visit.  - AMB MFM GENETICS REFERRAL - [place] MFM Fetal Nuchal Translucency; Future  Elevated 1 hr glucose/History of gestational diabetes  Glucose 216 today. A1c 5.8. Hx of gDM with last pregnancy. Will need to transfer care at this time. - Ambulatory referral to Obstetrics / Gynecology  . Anemia in first trimester (Hgb 10.7)  Will obtain ferritin to assess for iron deficiency anemia. Consider need for iron supplement.   . Rubella non-immune   Discussed vaccination postpartum   Nausea/Vomiting  Recently using diclegis with improvement. Brand is expensive. Discussed unisom and vitamin b6 OTC combination. Patient voiced understanding  Follow up with OBGYN for next prenatal visit.  *Arabic interpretation used during encounter.

## 2020-09-29 NOTE — Patient Instructions (Addendum)
It was wonderful to see you today.   Please bring ALL of your medications with you to every visit.   Today you were seen for your first OB visit.  We discussed getting Unisom and vitamin B6 over-the-counter and using them together as these are the 2 medications that are in Diclegis.  Continue to take your prenatal vitamin.  Today we got lab work on you if anything is abnormal we will give you a call otherwise we will communicate via MyChart.  We have referred you to OB/GYN because your blood sugar is increased. This makes you a higher risk pregnancy.   Please call the clinic at [phone] if you have any concerns. It was our pleasure to serve you.  Dr. Salvadore Dom   Prenatal Care Prenatal care is health care during pregnancy. It helps you and your unborn baby (fetus) stay as healthy as possible. Prenatal care may be provided by a midwife, a family practice health care provider, or a childbirth and pregnancy specialist (obstetrician). How does this affect me? During pregnancy, you will be closely monitored for any new conditions that might develop. To lower your risk of pregnancy complications, you and your health care provider will talk about any underlying conditions you have. How does this affect my baby? Early and consistent prenatal care increases the chance that your baby will be healthy during pregnancy. Prenatal care lowers the risk that your baby will be:  Born early (prematurely).  Smaller than expected at birth (small for gestational age). What can I expect at the first prenatal care visit? Your first prenatal care visit will likely be the longest. You should schedule your first prenatal care visit as soon as you know that you are pregnant. Your first visit is a good time to talk about any questions or concerns you have about pregnancy. At your visit, you and your health care provider will talk about:  Your medical history, including: ? Any past pregnancies. ? Your  family's medical history. ? The baby's father's medical history. ? Any long-term (chronic) health conditions you have and how you manage them. ? Any surgeries or procedures you have had. ? Any current over-the-counter or prescription medicines, herbs, or supplements you are taking.  Other factors that could pose a risk to your baby, including:  Your home setting and your stress levels, including: ? Exposure to abuse or violence. ? Household financial strain. ? Mental health conditions you have.  Your daily health habits, including diet and exercise. Your health care provider will also:  Measure your weight, height, and blood pressure.  Do a physical exam, including a pelvic and breast exam.  Perform blood tests and urine tests to check for: ? Urinary tract infection. ? Sexually transmitted infections (STIs). ? Low iron levels in your blood (anemia). ? Blood type and certain proteins on red blood cells (Rh antibodies). ? Infections and immunity to viruses, such as hepatitis B and rubella. ? HIV (human immunodeficiency virus).  Do an ultrasound to confirm your baby's growth and development and to help predict your estimated due date (EDD). This ultrasound is done with a probe that is inserted into the vagina (transvaginal ultrasound).  Discuss your options for genetic screening.  Give you information about how to keep yourself and your baby healthy, including: ? Nutrition and taking vitamins. ? Physical activity. ? How to manage pregnancy symptoms such as nausea and vomiting (morning sickness). ? Infections and substances that may be harmful to your baby and how to avoid  them. ? Food safety. ? Dental care. ? Working. ? Travel. ? Warning signs to watch for and when to call your health care provider. How often will I have prenatal care visits? After your first prenatal care visit, you will have regular visits throughout your pregnancy. The visit schedule is often as  follows:  Up to week 28 of pregnancy: once every 4 weeks.  28-36 weeks: once every 2 weeks.  After 36 weeks: every week until delivery. Some women may have visits more or less often depending on any underlying health conditions and the health of the baby. Keep all follow-up and prenatal care visits as told by your health care provider. This is important. What happens during routine prenatal care visits? Your health care provider will:  Measure your weight and blood pressure.  Check for fetal heart sounds.  Measure the height of your uterus in your abdomen (fundal height). This may be measured starting around week 20 of pregnancy.  Check the position of your baby inside your uterus.  Ask questions about your diet, sleeping patterns, and whether you can feel the baby move.  Review warning signs to watch for and signs of labor.  Ask about any pregnancy symptoms you are having and how you are dealing with them. Symptoms may include: ? Headaches. ? Nausea and vomiting. ? Vaginal discharge. ? Swelling. ? Fatigue. ? Constipation. ? Any discomfort, including back or pelvic pain. Make a list of questions to ask your health care provider at your routine visits. What tests might I have during prenatal care visits? You may have blood, urine, and imaging tests throughout your pregnancy, such as:  Urine tests to check for glucose, protein, or signs of infection.  Glucose tests to check for a form of diabetes that can develop during pregnancy (gestational diabetes mellitus). This is usually done around week 24 of pregnancy.  An ultrasound to check your baby's growth and development and to check for birth defects. This is usually done around week 20 of pregnancy.  A test to check for group B strep (GBS) infection. This is usually done around week 36 of pregnancy.  Genetic testing. This may include blood or imaging tests, such as an ultrasound. Some genetic tests are done during the first  trimester and some are done during the second trimester. What else can I expect during prenatal care visits? Your health care provider may recommend getting certain vaccines during pregnancy. These may include:  A yearly flu shot (annual influenza vaccine). This is especially important if you will be pregnant during flu season.  Tdap (tetanus, diphtheria, pertussis) vaccine. Getting this vaccine during pregnancy can protect your baby from whooping cough (pertussis) after birth. This vaccine may be recommended between weeks 27 and 36 of pregnancy. Later in your pregnancy, your health care provider may give you information about:  Childbirth and breastfeeding classes.  Choosing a health care provider for your baby.  Umbilical cord banking.  Breastfeeding.  Birth control after your baby is born.  The hospital labor and delivery unit and how to tour it.  Registering at the hospital before you go into labor. Where to find more information  Office on Women's Health: TravelLesson.ca  American Pregnancy Association: americanpregnancy.org  March of Dimes: marchofdimes.org Summary  Prenatal care helps you and your baby stay as healthy as possible during pregnancy.  Your first prenatal care visit will most likely be the longest.  You will have visits and tests throughout your pregnancy to monitor your health  and your baby's health.  Bring a list of questions to your visits to ask your health care provider.  Make sure to keep all follow-up and prenatal care visits with your health care provider. This information is not intended to replace advice given to you by your health care provider. Make sure you discuss any questions you have with your health care provider. Document Revised: 01/09/2019 Document Reviewed: 09/18/2017 Elsevier Patient Education  2020 [employer].

## 2020-09-30 ENCOUNTER — Telehealth: Payer: Self-pay | Admitting: Radiology

## 2020-09-30 DIAGNOSIS — O09529 Supervision of elderly multigravida, unspecified trimester: Secondary | ICD-10-CM | POA: Insufficient documentation

## 2020-09-30 LAB — CERVICOVAGINAL ANCILLARY ONLY
Chlamydia: NEGATIVE
Comment: NEGATIVE
Comment: NORMAL
Neisseria Gonorrhea: NEGATIVE

## 2020-09-30 NOTE — Telephone Encounter (Signed)
Left message for patient to call [facility] to schedule New OB appointment from referral

## 2020-10-01 LAB — HCV INTERPRETATION

## 2020-10-01 LAB — HGB FRACTIONATION CASCADE
Hgb A2: 2.4 % (ref 1.8–3.2)
Hgb A: 97.6 % (ref 96.4–98.8)
Hgb F: 0 % (ref 0.0–2.0)
Hgb S: 0 %

## 2020-10-01 LAB — FERRITIN: Ferritin: 22 ng/mL (ref 15–150)

## 2020-10-01 LAB — HCV AB W REFLEX TO QUANT PCR: HCV Ab: <0.1 s/co ratio (ref 0.0–0.9)

## 2020-10-29 ENCOUNTER — Ambulatory Visit: Payer: 59

## 2020-10-30 ENCOUNTER — Other Ambulatory Visit: Payer: Self-pay

## 2020-10-30 ENCOUNTER — Encounter: Payer: 59 | Admitting: Family Medicine

## 2020-10-30 ENCOUNTER — Ambulatory Visit (INDEPENDENT_AMBULATORY_CARE_PROVIDER_SITE_OTHER): Payer: 59 | Admitting: Family Medicine

## 2020-10-30 NOTE — Progress Notes (Signed)
Patient presents for Routine OB appointment.  Explained to patient through interpreter from [facility] ID# [id number], that she has been referred to [facility] [phone] because of having a HIGH RISK pregnancy.  Patient verbalized understanding.  Glennie Hawk, CMA

## 2020-11-03 ENCOUNTER — Ambulatory Visit: Payer: 59

## 2020-11-03 ENCOUNTER — Ambulatory Visit: Payer: Self-pay | Admitting: Genetic Counselor

## 2020-11-03 ENCOUNTER — Telehealth: Payer: Self-pay

## 2020-11-03 ENCOUNTER — Encounter: Payer: Self-pay | Admitting: *Deleted

## 2020-11-03 ENCOUNTER — Ambulatory Visit (HOSPITAL_BASED_OUTPATIENT_CLINIC_OR_DEPARTMENT_OTHER): Payer: 59 | Admitting: Genetic Counselor

## 2020-11-03 ENCOUNTER — Ambulatory Visit: Payer: 59 | Attending: Family Medicine

## 2020-11-03 ENCOUNTER — Other Ambulatory Visit: Payer: Self-pay | Admitting: *Deleted

## 2020-11-03 ENCOUNTER — Ambulatory Visit: Payer: 59 | Admitting: *Deleted

## 2020-11-03 ENCOUNTER — Other Ambulatory Visit: Payer: Self-pay

## 2020-11-03 VITALS — BP 109/76 | HR 92 | Wt 161.8 lb

## 2020-11-03 DIAGNOSIS — O09291 Supervision of pregnancy with other poor reproductive or obstetric history, first trimester: Secondary | ICD-10-CM

## 2020-11-03 DIAGNOSIS — O09529 Supervision of elderly multigravida, unspecified trimester: Secondary | ICD-10-CM | POA: Diagnosis present

## 2020-11-03 DIAGNOSIS — O09521 Supervision of elderly multigravida, first trimester: Secondary | ICD-10-CM

## 2020-11-03 DIAGNOSIS — D649 Anemia, unspecified: Secondary | ICD-10-CM

## 2020-11-03 DIAGNOSIS — Z3A13 13 weeks gestation of pregnancy: Secondary | ICD-10-CM

## 2020-11-03 DIAGNOSIS — E119 Type 2 diabetes mellitus without complications: Secondary | ICD-10-CM | POA: Diagnosis not present

## 2020-11-03 DIAGNOSIS — Z315 Encounter for genetic counseling: Secondary | ICD-10-CM | POA: Diagnosis present

## 2020-11-03 DIAGNOSIS — O24311 Unspecified pre-existing diabetes mellitus in pregnancy, first trimester: Secondary | ICD-10-CM

## 2020-11-03 DIAGNOSIS — O34211 Maternal care for low transverse scar from previous cesarean delivery: Secondary | ICD-10-CM

## 2020-11-03 DIAGNOSIS — O3481 Maternal care for other abnormalities of pelvic organs, first trimester: Secondary | ICD-10-CM

## 2020-11-03 DIAGNOSIS — Z419 Encounter for procedure for purposes other than remedying health state, unspecified: Secondary | ICD-10-CM | POA: Diagnosis not present

## 2020-11-03 DIAGNOSIS — Z3682 Encounter for antenatal screening for nuchal translucency: Secondary | ICD-10-CM

## 2020-11-03 DIAGNOSIS — N90810 Female genital mutilation status, unspecified: Secondary | ICD-10-CM

## 2020-11-03 DIAGNOSIS — O99011 Anemia complicating pregnancy, first trimester: Secondary | ICD-10-CM

## 2020-11-03 NOTE — Progress Notes (Signed)
11/03/2020  [name] Jul 18, 1986 MRN: [id number] DOV: 11/03/2020  [name] presented to the [facility] for Maternal Fetal Care for a genetics consultation regarding advanced maternal age. [name] presented to her appointment alone. This session was facilitated by a [facility] [place] interpreter.  Indication for genetic counseling - Advanced maternal age  Prenatal history  [name] is a [id number], 35 y.o. female. Her current pregnancy has completed [redacted]w[redacted]d (Estimated Date of Delivery: 05/06/21). [name] and her husband have a 38 year old daughter, a 1 year old son, and a 93 year old son together. [name] had one prior miscarriage around 8 weeks' gestation in between her pregnancies with her sons.  [name] denied exposure to environmental toxins or chemical agents. She denied the use of alcohol, tobacco or street drugs. She reported taking prenatal vitamins. She denied significant viral illnesses, fevers, and bleeding during the course of her pregnancy. Her medical and surgical histories were noncontributory.  Family History  A three generation pedigree was drafted and reviewed. The family history is remarkable for the following:  - [name] reported that she and her husband are distant cousins to one another. They share the same "fourth grandfather", making them less than third cousins.  - [name]'s husband's mother had several miscarriages. [name] reported that her husband's parents were cousins to one another. We discussed that it is possible that the consanguinity may have contributed to the history of miscarriages. However, without further information, risk assessment is limited.  The remaining family histories were reviewed and found to be noncontributory for birth defects, intellectual disability, recurrent pregnancy loss, and known genetic conditions.    The patient's ancestry is [place]. The father of the pregnancy's ancestry is [place]. Ashkenazi Jewish ancestry was denied. Pedigree  will be scanned under Media.  Discussion  Advanced maternal age:  [name] was referred to genetic counseling for advanced maternal age, as she will be 35 years old at the time of delivery. We discussed that as a woman ages, the risk for certain chromosomal conditions, such as trisomy 35 (Down syndrome), trisomy 69, and trisomy 18 increases. These conditions often are not inherited, but instead occur due to an error in chromosomal division during the formation of sperm and egg cells in a process called nondisjunction. At her age and during the first trimester, [name] has approximately a 1 in 61 (1.5%) chance of having a child with a chromosomal aneuploidy. Her age-related risk to have a child with Down syndrome specifically is 1 in 132 (0.8%) in the first trimester. We briefly reviewed features associated with Down syndrome, trisomy 101, and trisomy 34.    We reviewed noninvasive prenatal screening (NIPS) as an available screening option for chromosomal aneuploidies. Specifically, we discussed that NIPS analyzes cell free DNA originating from the placenta that is found in the maternal blood circulation during pregnancy. This test is not diagnostic for chromosome conditions, but can provide information regarding the presence or absence of extra fetal DNA for chromosomes 13, 18 21, and the sex chromosomes. Thus, it would not identify or rule out all fetal aneuploidy. The reported detection rate is 91-99% for trisomies 21, 18, 13, and sex chromosome aneuploidies. The false positive rate is reported to be less than 0.1% for any of these conditions. [name] indicated that she is interested in pursuing NIPS.    Ultrasound:  A first trimester nuchal translucency ultrasound was performed today prior to our visit. The ultrasound report will  be sent under separate cover. The nuchal translucency measurement was normal (1.67 MoM).   It is recommended that [name] return for an anatomy ultrasound with Maternal Fetal  Medicine around 18-20 weeks' gestation. She was counseled that ~50% of fetuses with Down syndrome and 90-95% of fetuses with trisomy 62 or trisomy 71 demonstrate a sign of the respective conditions on anatomy ultrasound.  Carrier screening:  Per ACOG recommendation, carrier screening for hemoglobinopathies, cystic fibrosis (CF) and spinal muscular atrophy (SMA) was discussed including information about the conditions, rationale for testing, autosomal recessive inheritance, and the option of prenatal diagnosis. [name] previously had a normal hemoglobin electrophoresis, significantly reducing her chances of being a carrier for a hemoglobinopathy. I offered additional carrier screening for CF and SMA, which [name] declined at this time. Without carrier screening to refine risk and based on the pan-ethnic carrier frequencies for the conditions, [name] chance of being a carrier for CF is 1 in 49 and her chance of being a carrier for SMA is 1 in 88. [name] was informed that select hemoglobinopathies, CF, and SMA are included on [place]'s newborn screen.   Diagnostic testing:  [name]. Furgeson was also counseled regarding the option of diagnostic testing via chorionic villus sampling (CV[facility]) or amniocentesis. We discussed the technical aspects of each procedure and quoted up to a 1 in 500 (0.2%) risk for spontaneous pregnancy loss or other adverse pregnancy outcomes as a result of either procedure. Cultured cells from either a placental or amniotic fluid sample allow for the visualization of a fetal karyotype, which can detect >99% of large chromosomal aberrations. Chromosomal microarray can also be performed to identify smaller deletions or duplications of fetal chromosomal material. After careful consideration, [name]. Mccarrell declined diagnostic testing at this time. She understands that diagnostic testing is available at any point through the end of pregnancy and that she may opt to undergo the procedure at a later  date should she change her mind.   Plan:  [name]. Stirling indicated that she would like to pursue NIPS. She currently has [employer] but applied for Medicaid at the end of December and is still waiting to hear back regarding her eligibility. We planned to order NIPS through the laboratory [facility], as they have the lowest-cost self-pay option ($99) should [name]. Blasingame's insurance not cover the cost of testing or should she not qualify for Medicaid.  Unfortunately, we were unable to obtain a sufficient blood sample from [name]. Karie Mainland today. She will return early next week to attempt sample collection again. Results from NIPS will take approximately one week to be returned from the time the laboratory receives [name]. Bram's sample. I will call her once results become available.  I counseled [name]. Dady regarding the above risks and available options. The approximate face-to-face time with the genetic counselor was 40 minutes.  In summary:  Discussed advanced maternal age and options for follow-up testing  1 in 44 (1.5%) chance of having child with chromosomal aneuploidy  Elected to have [facility] NIPS. Attempt to collect sample today was unsuccessful. Patient will return next Monday for sample collection. We will follow results   Reviewed results of ultrasound  Nuchal translucency measurement normal  Will schedule appointment for anatomy ultrasound in Maternal Fetal Medicine  Discussed carrier screening for cystic fibrosis, spinal muscular atrophy, and hemoglobinopathies  Negative hemoglobin electrophoresis  Declined carrier screening for cystic fibrosis and spinal muscular atrophy  Offered additional testing and screening  Declined chorionic villus sampling  Reviewed family history concerns   Gershon Crane, [name], Aeronautical engineer

## 2020-11-03 NOTE — Telephone Encounter (Signed)
Patient's spouse was handed estimate at time of visit.

## 2020-11-09 ENCOUNTER — Other Ambulatory Visit: Payer: Self-pay

## 2020-11-09 ENCOUNTER — Telehealth: Payer: Self-pay | Admitting: Family Medicine

## 2020-11-09 ENCOUNTER — Ambulatory Visit: Payer: 59

## 2020-11-09 DIAGNOSIS — O24419 Gestational diabetes mellitus in pregnancy, unspecified control: Secondary | ICD-10-CM

## 2020-11-09 NOTE — Telephone Encounter (Signed)
Pt walked in to ask for a referral to another OB clinic.  She stated [facility] is too far for her to travel.  Pls call her at [phone].

## 2020-11-11 NOTE — Telephone Encounter (Signed)
Called patient using [employer] ID # [id number].  There was no answer.  A voice mail was left informing patient of new referral and that [facility] will try to get a closer OB/GYN to follow her High Risk pregnancy.  Glennie Hawk, CMA

## 2020-11-11 NOTE — Telephone Encounter (Signed)
Please inform patient that referral was placed for an OBGYN closer to her documented address. Thank you.   Philicia Heyne Autry-Lott, DO 11/11/2020, 6:27 AM PGY-2, [facility]

## 2020-11-21 ENCOUNTER — Other Ambulatory Visit: Payer: Self-pay

## 2020-11-23 ENCOUNTER — Telehealth: Payer: Self-pay | Admitting: Genetic Counselor

## 2020-11-23 NOTE — Telephone Encounter (Signed)
Attempted to call Ms. Mccalister with the help of a [place], ID# [phone], to discuss her negative noninvasive prenatal screening (NIPS) results. Interpreter attempted to call mobile number x3 and house number x2 with no response. LVM on number listed as home phone re: good news about screening results. Requested a call back to my direct line to discuss these in more detail, as no identifiers were provided in voicemail message. Will try again tomorrow if I do not hear back from patient.   Gershon Crane, MS, [facility] Genetic Counselor

## 2020-11-25 ENCOUNTER — Telehealth: Payer: Self-pay | Admitting: Genetic Counselor

## 2020-11-25 NOTE — Telephone Encounter (Signed)
I called Ms. Rovito to discuss her negative noninvasive prenatal screening (NIPS) result. Specifically, Ms. Bents had NIPS through the laboratory [facility]. Testing was offered because of advanced maternal age. These negative results demonstrated an expected representation of chromosome 21, 18, 13, and sex chromosome material, greatly reducing the likelihood of trisomies 61, 22, or 68 and sex chromosome aneuploidies for the pregnancy. Ms. Zambrana requested to know about the expected fetal sex, which is female.  NIPS analyzes placental DNA in maternal circulation. NIPS is considered to be highly specific and sensitive, but is not considered to be a diagnostic test. We reviewed that this testing identifies 95-99% of pregnancies with trisomies 44, 6, and 10, as well as sex chromosome aneuploidies, but does not test for all genetic conditions. Diagnostic testing via amniocentesis is available should Ms. Hagberg be interested in confirming this result. She confirmed that she had no questions about these results at this time.  Gershon Crane, MS, [facility] Genetic Counselor

## 2020-11-26 ENCOUNTER — Encounter: Payer: 59 | Admitting: Obstetrics & Gynecology

## 2020-12-01 ENCOUNTER — Other Ambulatory Visit: Payer: Self-pay

## 2020-12-01 ENCOUNTER — Ambulatory Visit (INDEPENDENT_AMBULATORY_CARE_PROVIDER_SITE_OTHER): Payer: 59 | Admitting: Obstetrics and Gynecology

## 2020-12-01 ENCOUNTER — Encounter: Payer: 59 | Attending: Family Medicine | Admitting: Registered"

## 2020-12-01 ENCOUNTER — Ambulatory Visit: Payer: 59 | Admitting: Registered"

## 2020-12-01 ENCOUNTER — Encounter: Payer: Self-pay | Admitting: Obstetrics and Gynecology

## 2020-12-01 VITALS — BP 105/71 | HR 93 | Wt 165.5 lb

## 2020-12-01 DIAGNOSIS — Z3A17 17 weeks gestation of pregnancy: Secondary | ICD-10-CM

## 2020-12-01 DIAGNOSIS — O24419 Gestational diabetes mellitus in pregnancy, unspecified control: Secondary | ICD-10-CM | POA: Diagnosis not present

## 2020-12-01 DIAGNOSIS — O99891 Other specified diseases and conditions complicating pregnancy: Secondary | ICD-10-CM

## 2020-12-01 DIAGNOSIS — O0990 Supervision of high risk pregnancy, unspecified, unspecified trimester: Secondary | ICD-10-CM

## 2020-12-01 DIAGNOSIS — Z8632 Personal history of gestational diabetes: Secondary | ICD-10-CM | POA: Insufficient documentation

## 2020-12-01 DIAGNOSIS — N90810 Female genital mutilation status, unspecified: Secondary | ICD-10-CM

## 2020-12-01 DIAGNOSIS — O09529 Supervision of elderly multigravida, unspecified trimester: Secondary | ICD-10-CM

## 2020-12-01 DIAGNOSIS — Z283 Underimmunization status: Secondary | ICD-10-CM

## 2020-12-01 DIAGNOSIS — Z3A00 Weeks of gestation of pregnancy not specified: Secondary | ICD-10-CM | POA: Diagnosis not present

## 2020-12-01 DIAGNOSIS — Z419 Encounter for procedure for purposes other than remedying health state, unspecified: Secondary | ICD-10-CM | POA: Diagnosis not present

## 2020-12-01 DIAGNOSIS — O09899 Supervision of other high risk pregnancies, unspecified trimester: Secondary | ICD-10-CM | POA: Insufficient documentation

## 2020-12-01 DIAGNOSIS — Z789 Other specified health status: Secondary | ICD-10-CM

## 2020-12-01 DIAGNOSIS — Z2839 Other underimmunization status: Secondary | ICD-10-CM

## 2020-12-01 HISTORY — DX: Gestational diabetes mellitus in pregnancy, unspecified control: O24.419

## 2020-12-01 MED ORDER — ONDANSETRON 4 MG PO TBDP: 4.0000 mg | ORAL_TABLET | Freq: Four times a day (QID) | ORAL | 1 refills | Status: DC | PRN

## 2020-12-01 MED ORDER — ACCU-CHEK NANO SMARTVIEW W/DEVICE KIT: 1.0000 | PACK | 0 refills | Status: DC

## 2020-12-01 MED ORDER — ACCU-CHEK SMARTVIEW VI STRP: ORAL_STRIP | 12 refills | Status: DC

## 2020-12-01 MED ORDER — DOCUSATE SODIUM 100 MG PO CAPS: 100.0000 mg | ORAL_CAPSULE | Freq: Two times a day (BID) | ORAL | 2 refills | Status: DC | PRN

## 2020-12-01 NOTE — Patient Instructions (Signed)
Gestational Diabetes Mellitus, Self-Care When you have gestational diabetes mellitus, you must make sure your blood sugar (glucose) stays at a healthy level. What are the risks? If you do not get treated for this condition, it may cause problems for you and your unborn baby. For the mother  Giving birth to the baby early.  Having problems during labor and when giving birth.  Needing surgery to give birth to the baby (cesarean delivery).  Having problems with blood pressure.  Getting this form of diabetes again when pregnant.  Getting type 2 diabetes in the future. For the baby  Low blood sugar.  Bigger body size than is normal.  Breathing problems. How to monitor blood sugar Check your blood sugar every day while you are pregnant. Check it as often as told by your doctor. To do this: 1. Wash your hands with soap and water for at least 20 seconds. 2. Prick the side of your finger (not the tip) with the lancet. Use a different finger each time. 3. Gently rub the finger until a small drop of blood appears. 4. Follow instructions that come with your meter for: ? Putting in the test strip. ? Putting blood on the strip. ? Getting the result. 5. Write down your result and any notes. In general, your blood sugar levels should be:  95 mg/dL (5.3 mmol/L) if you have not eaten.  140 mg/dL (7.8 mmol/L) 1 hour after a meal.  120 mg/dL (6.7 mmol/L) 2 hours after a meal.   Follow these instructions at home: Medicines  Take over-the-counter and prescription medicines only as told by your doctor.  If your doctor prescribed insulin or other diabetes medicines: ? Take them every day. ? Do not run out of insulin or other medicines. Plan ahead so you always have them. Eating and drinking  Follow instructions from your doctor about eating or drinking restrictions.  See a food expert (dietician) to help you create an eating plan that helps control your blood sugar. The foods in this  plan will include: ? Low-fat proteins. ? Dried beans, nuts, and whole grain breads, cereals, or pasta. ? Fresh fruits and vegetables. ? Low-fat dairy products. ? Healthy fats.  Eat healthy snacks between healthy meals.  Drink enough fluid to keep your pee (urine) pale yellow.  Keep track of carbs that you eat. To do this: ? Read food labels. ? Learn the serving sizes of foods.  Follow your sick day plan when you cannot eat or drink normally. Make this plan with your doctor so it is ready to use.   Activity  Do exercises as told by your doctor.  Exercise for 30 or more minutes a day, or as much as your doctor recommends. To help you control blood sugar levels after a meal: ? Do 10 minutes of exercise after each meal. ? Start this exercise 30 minutes after the meal.  Talk with your doctor before you start a new exercise. Your doctor may tell you to change your insulin, other medicines, or food. Lifestyle  Do not drink alcohol.  Do not use any products that contain nicotine or tobacco, such as cigarettes, e-cigarettes, and chewing tobacco. If you need help quitting, ask your doctor.  Learn how to deal with stress. If you need help with this, ask your doctor. Body care  Stay up to date with your shots (vaccines).  Take good care of your teeth. To do this: ? Brush your teeth and gums two times a day. ?   Floss one or more times a day. ? Go to the dentist one or more times every 6 months.  Stay at a healthy weight while you are pregnant. General instructions  Ask your doctor about risks of high blood pressure in pregnancy.  Share your diabetes care plan with: ? Your work or school. ? People you live with.  Check your pee for ketones: ? When you are sick. ? As told by your doctor.  Carry a card or wear a bracelet that says you have diabetes.  Keep all follow-up visits. Care after giving birth  Have your blood sugar checked 4-12 weeks after you give birth.  Get  checked for diabetes one or more times every 3 years or as told. Where to find more information  American Diabetes Association (ADA): diabetes.org  Association of Diabetes Care & Education Specialists (ADCES): diabeteseducator.org  Centers for Disease Control and Prevention (CDC): cdc.gov  American Pregnancy Association: americanpregnancy.org  U.S. Department of Agriculture MyPlate: myplate.gov Contact a doctor if:  Your blood sugar is above your target for two tests in a row.  You have a fever.  You are sick for 2 days or more and do not get better.  You have either of these problems for more than 6 hours: ? You vomit every time you eat or drink. ? You have watery poop (diarrhea). Get help right away if:  You cannot think clearly.  You have trouble breathing.  You have moderate or high ketones in your pee.  Blood or abnormal fluid starts to come out of your vagina.  You feel your baby is not moving as usual.  You start having early contractions. You may feel your belly tighten.  You have a very bad headache. These symptoms may be an emergency. Get help right away. Call your local emergency services (911 in the [place]  Do not wait to see if the symptoms will go away.  Do not drive yourself to the hospital. Summary  Check your blood sugar (glucose) while you are pregnant. Check it as often as told by your doctor.  Take your insulin and diabetes medicines as told.  Have your blood sugar checked 4-12 weeks after you give birth.  Keep all follow-up visits. This information is not intended to replace advice given to you by your health care provider. Make sure you discuss any questions you have with your health care provider. Document Revised: 02/24/2020 Document Reviewed: 02/24/2020 Elsevier Patient Education  2021 Elsevier Inc.  

## 2020-12-01 NOTE — Progress Notes (Signed)
Subjective:    [name] is a [id number] 17w5dbeing seen today for her first obstetrical visit.  Her obstetrical history is significant for history of 3 prior c sections. Care transferred here after abnormal 1 hour gtt. Patient does intend to breast feed. Pregnancy history fully reviewed.  Reports no prior history of gestational diabetes however does say she has occasionally checked her blood sugars at home.   Patient reports nausea. Reports taking diclegis but its not helping. Has had this issue in past pregnancies as well. Has history of three c sections. Also history of cholecystectomy.   Reviewed prior op note from 2016 - noted to have moderate adhesive disease.   Vitals:   12/01/20 0906  BP: 105/71  Pulse: 93  Weight: 165 lb 8 oz (75.1 kg)    HISTORY: OB History  Gravida Para Term Preterm AB Living  _0 0 1 3  SAB IAB Ectopic Multiple Live Births  1 0 0 0 3    # Outcome Date GA Lbr Len/2nd Weight Sex Delivery Anes PTL Lv  5 Current           4 Term 12/22/14 [id number] lb 3.9 oz (3.285 kg) M CS-LTranv Spinal  LIV  3 Term 01/21/10    M CS-LTranv Spinal N LIV  2 Term 11/21/07 [id number] CS-LTranv Spinal N LIV     Complications: Failure to Progress in First Stage  1 SAB             [place]:  Diagnosis Date  . Anemia   . Gestational diabetes 12/01/2020   Past Surgical History:  Procedure Laterality Date  . CESAREAN SECTION  2009; 2011; 2016   x 2 [place] CES[place] SE[place] N/A 12/22/2014   Procedure: CESAREAN SECTION;  Surgeon: UgoOsborne OmanD;  [facility];  Service: Obstetrics;  Laterality: N/A;  . CHOLECYSTECTOMY N/A 01/31/2017   Procedure: LAPAROSCOPIC CHOLECYSTECTOMY WITH INTRAOPERATIVE CHOLANGIOGRAM; REPAIR OF UMBILICAL HERNIA;  Surgeon: MatDonnie MesaD;  [facility];  Laterality: N/A;  . ESOPHAGOGASTRODUODENOSCOPY N/A 01/29/2017   Procedure: ESOPHAGOGASTRODUODENOSCOPY (EGD);  Surgeon:  PatCarol AdaD;  [facility];  Service: Endoscopy;  Laterality: N/A;  . Female Circumcision     Family History  Problem Relation Age of Onset  . Hypertension Mother      Exam     Skin: normal coloration and turgor, no rashes    Neurologic: oriented, normal   Extremities: normal strength, tone, and muscle mass   HEENT PERRLA   Mouth/Teeth mucous membranes moist, pharynx normal without lesions   Neck supple   Cardiovascular: regular rate and rhythm   Respiratory:  appears well, vitals normal, no respiratory distress, acyanotic, normal RR, ear and throat exam is normal, neck free of mass or lymphadenopathy, chest clear, no wheezing, crepitations, rhonchi, normal symmetric air entry   Abdomen: soft, non-tender; bowel sounds normal; no masses,  no organomegaly      Assessment:    Pregnancy: [id number] Active Problem List   Diagnosis Date Noted  . Rubella non-immune status, antepartum 12/01/2020  . Gestational diabetes 12/01/2020  . Encounter for supervision of high risk multigravida of advanced maternal age, antepartum 09/30/2020  . Vision disturbance 09/02/2020  . Prediabetes 02/18/2020  . Desire for pregnancy 12/13/2019  . Low mean corpuscular volume (MCV) 12/11/2019  . Hypopigmentation 08/29/2018  . Female genital mutilation with  clitorectomy 01/07/2016  . Seasonal allergies 12/08/2015  . Immigrant with language difficulty 08/04/2014        Plan:     Supervision of high risk pregnancy Initial labs drawn. Prenatal vitamins. Problem list reviewed and updated. Genetic Screening discussed already performed. 20 wk anatomy scan already scheduled. Follow up in 4 weeks.  A1GDM: diagnosed based on early one hour gtt > 190. Discussed w patient  -provided with test strips, glucometer, instructed on checking blood sugars.  -Referred to diabetes educator and was able to meet with them today after appointment   Rubella Non-Immune  -MMR postpartum    History of prior c section  -3 prior with moderate adhesive disease, will need repeat C section   Language barrier -interpreter used   Janet Berlin 12/01/2020

## 2020-12-02 NOTE — Progress Notes (Signed)
Interpreter services provided by Ardelle Anton #[id number] from [employer]   Patient was seen on 12/01/2020 for Gestational Diabetes self-management. EDD 05/06/2021; [redacted]w[redacted]d  Glucose 1 hr prenatal, POC 216 mg/dL; A1c 5.8% 2 mo ago; 6.0% 9 mo ago  Patient states no history of GDM. Diet history obtained. Patient eats variety of all food groups. Beverages include water, WIC juice, broth.  Patient states she is still experiencing pregnancy related N/V. Pt states she eats a late dinner because if she gets too hungry in the morning it makes her vomit.     The following learning objectives were met by the patient :   States the definition of Gestational Diabetes  States why dietary management is important in controlling blood glucose  Describes the effects of carbohydrates on blood glucose levels  Demonstrates ability to create a balanced meal plan  Demonstrates carbohydrate counting   States when to check blood glucose levels  Demonstrates proper blood glucose monitoring techniques  States the effect of stress and exercise on blood glucose levels  States the importance of limiting caffeine and abstaining from alcohol and smoking  Plan:  Aim for 3 Carbohydrate Choices per meal (45 grams) +/- 1 either way  Aim for 1-2 Carbohydrate Choices per snack Begin reading food labels for Total Carbohydrate of foods If OK with your MD, consider  increasing your activity level by walking, Arm Chair Exercises or other activity daily as tolerated Begin checking Blood Glucose before breakfast and 2 hours after first bite of breakfast, lunch and dinner as directed by MD  Bring Log Book/Sheet and meter to every medical appointment  Baby Scripts: (BS 2.0 not capable of glucose management at this time.) Patient to record blood sugar on glucose log sheet  Take medication if directed by MD  Blood glucose monitor given: [employer] # [id number]: 103 mg/dL  Patient instructed to monitor glucose levels: FBS: 60 - 95  mg/dl 2 hour: <120 mg/dl  Patient received the following handouts: (in [place])  Nutrition Diabetes and Pregnancy  Carbohydrate Counting List  Blood glucose Log Sheet  Patient will be seen for follow-up in 2 weeks or as needed.

## 2020-12-03 ENCOUNTER — Other Ambulatory Visit: Payer: Self-pay

## 2020-12-15 ENCOUNTER — Encounter: Payer: Self-pay | Admitting: *Deleted

## 2020-12-15 ENCOUNTER — Encounter: Payer: 59 | Admitting: Registered"

## 2020-12-15 ENCOUNTER — Ambulatory Visit: Payer: 59 | Attending: Obstetrics

## 2020-12-15 ENCOUNTER — Other Ambulatory Visit: Payer: Self-pay

## 2020-12-15 ENCOUNTER — Other Ambulatory Visit: Payer: Self-pay | Admitting: *Deleted

## 2020-12-15 ENCOUNTER — Ambulatory Visit: Payer: 59 | Admitting: Registered"

## 2020-12-15 ENCOUNTER — Ambulatory Visit: Payer: 59 | Admitting: *Deleted

## 2020-12-15 VITALS — BP 99/65 | HR 80

## 2020-12-15 DIAGNOSIS — Z3A00 Weeks of gestation of pregnancy not specified: Secondary | ICD-10-CM | POA: Diagnosis not present

## 2020-12-15 DIAGNOSIS — O2692 Pregnancy related conditions, unspecified, second trimester: Secondary | ICD-10-CM

## 2020-12-15 DIAGNOSIS — O99012 Anemia complicating pregnancy, second trimester: Secondary | ICD-10-CM

## 2020-12-15 DIAGNOSIS — O24410 Gestational diabetes mellitus in pregnancy, diet controlled: Secondary | ICD-10-CM

## 2020-12-15 DIAGNOSIS — O09292 Supervision of pregnancy with other poor reproductive or obstetric history, second trimester: Secondary | ICD-10-CM | POA: Diagnosis not present

## 2020-12-15 DIAGNOSIS — D649 Anemia, unspecified: Secondary | ICD-10-CM

## 2020-12-15 DIAGNOSIS — O34219 Maternal care for unspecified type scar from previous cesarean delivery: Secondary | ICD-10-CM | POA: Diagnosis not present

## 2020-12-15 DIAGNOSIS — Z3A19 19 weeks gestation of pregnancy: Secondary | ICD-10-CM

## 2020-12-15 DIAGNOSIS — O24419 Gestational diabetes mellitus in pregnancy, unspecified control: Secondary | ICD-10-CM | POA: Diagnosis not present

## 2020-12-15 DIAGNOSIS — O09522 Supervision of elderly multigravida, second trimester: Secondary | ICD-10-CM | POA: Insufficient documentation

## 2020-12-15 DIAGNOSIS — O09529 Supervision of elderly multigravida, unspecified trimester: Secondary | ICD-10-CM | POA: Diagnosis not present

## 2020-12-15 NOTE — Progress Notes (Signed)
Interpreter services provided by [employer] [phone] from [employer] video services  Patient was seen on 12/15/20 for follow-up assessment and education for Gestational Diabetes. EDD 05/06/21. Patient states changes to diet/lifestyle, has stopped drinking juice. Pt states she is afraid to eat carbs because of blood sugar. Patient had questions about cereal and bread and timing of meals.   Patient states sometimes she feels lethargic. Pt states she is confused about when she is supposed to eat. Prior to diagnosis her eating patter was:  7 am Something small or light such as cereal or tea with milk (stopped eating cereal due to blood sugar going up to 141 mg/dL) 10 am Breakfast 3 pm Lunch 7 pm Dinner   Patient is testing blood glucose as directed pre breakfast and 2 hours after each meal. But sometimes is out when it is time to check blood sugar so does not have complete record.    The following learning objectives reviewed during follow-up visit:   Effect of protein and fiber to help body handle carbs better  Changes that may occur in 3rd trimester that may require medication to control blood sugar  Role of carbohydrates in energy  Plan:  . If you want to try eating a different kind of cereal, Fiber One or Rosalie Doctor may not raise your blood sugar too high . Whole grain bread is fine, 2 pieces is not considered too much. . Continue to choose beverages with little or no carbohydrates   Patient instructed to monitor glucose levels: FBS: 60 - 95 mg/dl 2 hour: <737 mg/dl  Patient received the following handouts:  none  Patient will be seen for follow-up in 5 weeks or as needed.

## 2020-12-28 ENCOUNTER — Other Ambulatory Visit: Payer: Self-pay

## 2020-12-28 ENCOUNTER — Ambulatory Visit (INDEPENDENT_AMBULATORY_CARE_PROVIDER_SITE_OTHER): Payer: 59 | Admitting: Family Medicine

## 2020-12-28 VITALS — BP 109/70 | HR 87 | Wt 171.0 lb

## 2020-12-28 DIAGNOSIS — O24419 Gestational diabetes mellitus in pregnancy, unspecified control: Secondary | ICD-10-CM

## 2020-12-28 DIAGNOSIS — O34219 Maternal care for unspecified type scar from previous cesarean delivery: Secondary | ICD-10-CM

## 2020-12-28 DIAGNOSIS — Z3490 Encounter for supervision of normal pregnancy, unspecified, unspecified trimester: Secondary | ICD-10-CM

## 2020-12-28 DIAGNOSIS — O99891 Other specified diseases and conditions complicating pregnancy: Secondary | ICD-10-CM

## 2020-12-28 DIAGNOSIS — Z283 Underimmunization status: Secondary | ICD-10-CM

## 2020-12-28 DIAGNOSIS — Z2839 Other underimmunization status: Secondary | ICD-10-CM

## 2020-12-28 DIAGNOSIS — O09529 Supervision of elderly multigravida, unspecified trimester: Secondary | ICD-10-CM

## 2020-12-28 DIAGNOSIS — Z603 Acculturation difficulty: Secondary | ICD-10-CM

## 2020-12-28 DIAGNOSIS — Z758 Other problems related to medical facilities and other health care: Secondary | ICD-10-CM

## 2020-12-28 DIAGNOSIS — O09899 Supervision of other high risk pregnancies, unspecified trimester: Secondary | ICD-10-CM

## 2020-12-28 DIAGNOSIS — R7303 Prediabetes: Secondary | ICD-10-CM

## 2020-12-28 DIAGNOSIS — Z789 Other specified health status: Secondary | ICD-10-CM

## 2020-12-28 MED ORDER — PRENATAL VITAMIN AND MINERAL 28-0.8 MG PO TABS: 1.0000 | ORAL_TABLET | Freq: Every day | ORAL | 6 refills | Status: DC

## 2020-12-28 MED ORDER — ASPIRIN EC 81 MG PO TBEC: 81.0000 mg | DELAYED_RELEASE_TABLET | Freq: Every day | ORAL | 11 refills | Status: DC

## 2020-12-28 NOTE — Progress Notes (Signed)
Vonna Drafts- in-person interpreter

## 2020-12-28 NOTE — Progress Notes (Signed)
PRENATAL VISIT NOTE  Subjective:  [name] is a 35 y.o. [phone] at 39w4dbeing seen today for ongoing prenatal care.  She is currently monitored for the following issues for this high-risk pregnancy and has Immigrant with language difficulty; Language barrier; Previous cesarean delivery, antepartum condition or complication; Seasonal allergies; Female genital mutilation with clitorectomy; Hypopigmentation; Prediabetes; Vision disturbance; Encounter for supervision of high risk multigravida of advanced maternal age, antepartum; Rubella non-immune status, antepartum; and Gestational diabetes on their problem list.  Patient reports no complaints.  Contractions: Not present. Vag. Bleeding: None.  Movement: Present. Denies leaking of fluid.   The following portions of the patient's history were reviewed and updated as appropriate: allergies, current medications, past family history, past medical history, past social history, past surgical history and problem list.   Objective:   Vitals:   12/28/20 0942  BP: 109/70  Pulse: 87  Weight: 171 lb (77.6 kg)    Fetal Status: Fetal Heart Rate (bpm): 160   Movement: Present     General:  Alert, oriented and cooperative. Patient is in no acute distress.  Skin: Skin is warm and dry. No rash noted.   Cardiovascular: Normal heart rate noted  Respiratory: Normal respiratory effort, no problems with respiration noted  Abdomen: Soft, gravid, appropriate for gestational age.  Pain/Pressure: Absent     Pelvic: Cervical exam deferred        Extremities: Normal range of motion.  Edema: None  Mental Status: Normal mood and affect. Normal behavior. Normal judgment and thought content.   Assessment and Plan:  Pregnancy: [id number] 224w4dEncounter for supervision of high risk multigravida of advanced maternal age, antepartum -doing well without complaints -VSS -taking PNV, start bASA today given diabetes -discussed contraception, patient desires  liletta IUD, plan for intra-op placement -     aspirin EC 81 MG tablet; Take 1 tablet (81 mg total) by mouth daily. Swallow whole.  Gestational diabetes mellitus (GDM), antepartum, gestational diabetes method of control unspecified Prediabetes Failed 1hr gtt >190 09/29/20. Has test strips/supplies at home, did not present with BGL logs today. Has met with diabetic education. Discussed readings, many over goal. Early a1c 6.0, suspect patient likely has pre-diabetes. Will schedule 2hr gtt to confirm. Patient likely needs medication pending BGL logs, will have her return in 2 weeks to discuss. EFW on anatomy scan 361g, 88%ile _0 , follow up growth scan scheduled 01/13/21. -     aspirin EC 81 MG tablet; Take 1 tablet (81 mg total) by mouth daily. Swallow whole. -     Glucose Tolerance, 2 Hours w/1 Hour  Rubella non-immune status, antepartum MMR pp  Immigrant with language difficulty Language barrier In person interpreter used for entirety of visit.  Previous cesarean delivery, antepartum condition or complication History of c/s x3, will need to schedule repeat c/s likely at 39w.   Preterm labor symptoms and general obstetric precautions including but not limited to vaginal bleeding, contractions, leaking of fluid and fetal movement were reviewed in detail with the patient. Please refer to After Visit Summary for other counseling recommendations.   Return in about 2 weeks (around 01/11/2021) for [ ] in.  Future Appointments  Date Time Provider [place]3/29/2022  8:50 AM [facility]13/2022 11:15 AM [facility] NURSE [facility]4/13/2022 11:30 AM [facility] US3 [facility]4/14/2022  1:35 PM PrDonnamae JudeMD [facility]4/19/2022  8:15 AM [facility] [place]  Zacarias Pontes, MD

## 2020-12-28 NOTE — Patient Instructions (Signed)
-  start baby aspirin daily -continue your prenatal vitamin -nothing to eat or drink before blood sugar test -bring sugar logs to your next appt

## 2020-12-29 ENCOUNTER — Other Ambulatory Visit: Payer: 59

## 2020-12-29 DIAGNOSIS — O24419 Gestational diabetes mellitus in pregnancy, unspecified control: Secondary | ICD-10-CM | POA: Diagnosis not present

## 2020-12-30 LAB — GLUCOSE TOLERANCE, 2 HOURS W/ 1HR
Glucose, 1 hour: 162 mg/dL (ref 65–179)
Glucose, 2 hour: 160 mg/dL — ABNORMAL HIGH (ref 65–152)
Glucose, Fasting: 88 mg/dL (ref 65–91)

## 2021-01-01 DIAGNOSIS — Z419 Encounter for procedure for purposes other than remedying health state, unspecified: Secondary | ICD-10-CM | POA: Diagnosis not present

## 2021-01-13 ENCOUNTER — Other Ambulatory Visit: Payer: Self-pay

## 2021-01-13 ENCOUNTER — Other Ambulatory Visit: Payer: Self-pay | Admitting: *Deleted

## 2021-01-13 ENCOUNTER — Encounter: Payer: Self-pay | Admitting: *Deleted

## 2021-01-13 ENCOUNTER — Ambulatory Visit: Payer: 59 | Attending: Obstetrics

## 2021-01-13 ENCOUNTER — Ambulatory Visit: Payer: 59 | Admitting: *Deleted

## 2021-01-13 VITALS — BP 104/68 | HR 85

## 2021-01-13 DIAGNOSIS — O09522 Supervision of elderly multigravida, second trimester: Secondary | ICD-10-CM | POA: Insufficient documentation

## 2021-01-13 DIAGNOSIS — O24410 Gestational diabetes mellitus in pregnancy, diet controlled: Secondary | ICD-10-CM | POA: Insufficient documentation

## 2021-01-13 DIAGNOSIS — N90813 Female genital mutilation Type III status: Secondary | ICD-10-CM | POA: Diagnosis not present

## 2021-01-13 DIAGNOSIS — O99212 Obesity complicating pregnancy, second trimester: Secondary | ICD-10-CM

## 2021-01-13 DIAGNOSIS — O09292 Supervision of pregnancy with other poor reproductive or obstetric history, second trimester: Secondary | ICD-10-CM

## 2021-01-13 DIAGNOSIS — O34219 Maternal care for unspecified type scar from previous cesarean delivery: Secondary | ICD-10-CM

## 2021-01-13 DIAGNOSIS — Z3A23 23 weeks gestation of pregnancy: Secondary | ICD-10-CM

## 2021-01-13 DIAGNOSIS — D649 Anemia, unspecified: Secondary | ICD-10-CM | POA: Diagnosis not present

## 2021-01-13 DIAGNOSIS — O3482 Maternal care for other abnormalities of pelvic organs, second trimester: Secondary | ICD-10-CM

## 2021-01-14 ENCOUNTER — Other Ambulatory Visit: Payer: Self-pay

## 2021-01-14 ENCOUNTER — Ambulatory Visit (INDEPENDENT_AMBULATORY_CARE_PROVIDER_SITE_OTHER): Payer: 59 | Admitting: Family Medicine

## 2021-01-14 VITALS — BP 112/75 | HR 94 | Wt 177.6 lb

## 2021-01-14 DIAGNOSIS — Z789 Other specified health status: Secondary | ICD-10-CM

## 2021-01-14 DIAGNOSIS — O09529 Supervision of elderly multigravida, unspecified trimester: Secondary | ICD-10-CM

## 2021-01-14 DIAGNOSIS — O24415 Gestational diabetes mellitus in pregnancy, controlled by oral hypoglycemic drugs: Secondary | ICD-10-CM

## 2021-01-14 MED ORDER — ACCU-CHEK SOFTCLIX LANCETS MISC: 1.0000 | Freq: Four times a day (QID) | 2 refills | Status: DC

## 2021-01-14 MED ORDER — METFORMIN HCL 500 MG PO TABS: 500.0000 mg | ORAL_TABLET | Freq: Two times a day (BID) | ORAL | 1 refills | Status: DC

## 2021-01-14 MED ORDER — ACCU-CHEK SMARTVIEW VI STRP: ORAL_STRIP | 12 refills | Status: DC

## 2021-01-14 NOTE — Progress Notes (Signed)
Spoke to patient today about diabetes. She does not endorse any symptoms other than being sleepy. Patient mentions eating eggs/tuna/vegetables/fish/chicken for meals. She needs lancets and test strips for AccuChek glucometer.

## 2021-01-14 NOTE — Progress Notes (Signed)
[employer]- Arabic in person interpreter.

## 2021-01-14 NOTE — Progress Notes (Signed)
   PRENATAL VISIT NOTE  Subjective:  [name] is a 35 y.o. [phone] at [redacted]w[redacted]d being seen today for ongoing prenatal care.  She is currently monitored for the following issues for this high-risk pregnancy and has Immigrant with language difficulty; Language barrier; Previous cesarean delivery, antepartum condition or complication; Seasonal allergies; Female genital mutilation with clitorectomy; Hypopigmentation; Prediabetes; Vision disturbance; Encounter for supervision of high risk multigravida of advanced maternal age, antepartum; Rubella non-immune status, antepartum; and Gestational diabetes on their problem list.  Patient reports no complaints.  Contractions: Not present. Vag. Bleeding: None.  Movement: Present. Denies leaking of fluid.   The following portions of the patient's history were reviewed and updated as appropriate: allergies, current medications, past family history, past medical history, past social history, past surgical history and problem list.   Objective:   Vitals:   01/14/21 1350  BP: 112/75  Pulse: 94  Weight: 177 lb 9.6 oz (80.6 kg)    Fetal Status: Fetal Heart Rate (bpm): 166   Movement: Present     General:  Alert, oriented and cooperative. Patient is in no acute distress.  Skin: Skin is warm and dry. No rash noted.   Cardiovascular: Normal heart rate noted  Respiratory: Normal respiratory effort, no problems with respiration noted  Abdomen: Soft, gravid, appropriate for gestational age.  Pain/Pressure: Present     Pelvic: Cervical exam deferred        Extremities: Normal range of motion.  Edema: None  Mental Status: Normal mood and affect. Normal behavior. Normal judgment and thought content.   Assessment and Plan:  Pregnancy: [id number] at [redacted]w[redacted]d 1. Language barrier Arabic interpreter: in person used   2. Encounter for supervision of high risk multigravida of advanced maternal age, antepartum Low risk NIPT  3. Gestational diabetes mellitus (GDM) in  second trimester controlled on oral hypoglycemic drug  Media Information       Needs strips and lancets Begin Metformin as CBGs are mildly out of range - Accu-Chek Softclix Lancets lancets; 1 each by Other route 4 (four) times daily. Use as instructed  Dispense: 100 each; Refill: 2 - metFORMIN (GLUCOPHAGE) 500 MG tablet; Take 1 tablet (500 mg total) by mouth 2 (two) times daily with a meal.  Dispense: 180 tablet; Refill: 1 - glucose blood (ACCU-CHEK SMARTVIEW) test strip; Use as instructed to check blood sugars  Dispense: 100 each; Refill: 12  Preterm labor symptoms and general obstetric precautions including but not limited to vaginal bleeding, contractions, leaking of fluid and fetal movement were reviewed in detail with the patient. Please refer to After Visit Summary for other counseling recommendations.   Return in 2 weeks (on 01/28/2021) for [facility], in person due to language barrier.  Future Appointments  Date Time Provider Department Center  01/19/2021  8:15 AM [facility]  01/28/2021  8:35 AM Lesly Dukes, MD [facility]  02/18/2021 11:00 AM [facility] NURSE [facility]  02/18/2021 11:15 AM [facility] US2 [facility]    Reva Bores, MD

## 2021-01-14 NOTE — Patient Instructions (Signed)

## 2021-01-19 ENCOUNTER — Ambulatory Visit: Payer: 59 | Admitting: Registered"

## 2021-01-19 ENCOUNTER — Other Ambulatory Visit: Payer: Self-pay

## 2021-01-19 DIAGNOSIS — O24419 Gestational diabetes mellitus in pregnancy, unspecified control: Secondary | ICD-10-CM

## 2021-01-19 NOTE — Progress Notes (Signed)
Interpreter services provided by [employer] [phone] from [employer] video   Patient was seen on 01/19/21 for follow-up assessment and education for Gestational Diabetes. EDD 05/06/21; [redacted]w[redacted]d. Patient states changes to diet/lifestyle including started metformin 500 mg bid.   Patient had question if she should take medicine if her blood sugar is in the normal range.  Patient is testing blood glucose but got confused about where to write the information on the log sheet and only had 3 values per day.   Review of Blood Glucose Log: last 4 days ~70% WNL  Honey bunches of oats 34 g/1 cup and 1% milk  The following learning objectives reviewed during follow-up visit:   Cereal  Future potential need for insulin during 3rd trimester  Action of metformin  Plan:  . Test blood sugar after eating cereal and if above target, eat less next time and add protein to meal . Take metformin as prescribed   Patient instructed to monitor glucose levels: FBS: 60 - 95 mg/dl 2 hour: <270 mg/dl  Patient received the following handouts:  Blood sugar log  Patient will be seen for follow-up in 6 weeks or as needed.

## 2021-01-28 ENCOUNTER — Other Ambulatory Visit: Payer: Self-pay

## 2021-01-28 ENCOUNTER — Ambulatory Visit (INDEPENDENT_AMBULATORY_CARE_PROVIDER_SITE_OTHER): Payer: 59 | Admitting: Obstetrics & Gynecology

## 2021-01-28 VITALS — BP 108/74 | HR 80 | Wt 173.8 lb

## 2021-01-28 DIAGNOSIS — O24419 Gestational diabetes mellitus in pregnancy, unspecified control: Secondary | ICD-10-CM

## 2021-01-28 DIAGNOSIS — O09529 Supervision of elderly multigravida, unspecified trimester: Secondary | ICD-10-CM

## 2021-01-28 DIAGNOSIS — O24415 Gestational diabetes mellitus in pregnancy, controlled by oral hypoglycemic drugs: Secondary | ICD-10-CM

## 2021-01-28 DIAGNOSIS — Z23 Encounter for immunization: Secondary | ICD-10-CM | POA: Diagnosis not present

## 2021-01-28 LAB — POCT URINALYSIS DIP (DEVICE)
Bilirubin Urine: NEGATIVE
Glucose, UA: NEGATIVE mg/dL
Hgb urine dipstick: NEGATIVE
Ketones, ur: NEGATIVE mg/dL
Leukocytes,Ua: NEGATIVE
Nitrite: NEGATIVE
Protein, ur: NEGATIVE mg/dL
Specific Gravity, Urine: 1.025 (ref 1.005–1.030)
Urobilinogen, UA: 1 mg/dL (ref 0.0–1.0)
pH: 6 (ref 5.0–8.0)

## 2021-01-28 MED ORDER — METFORMIN HCL 1000 MG PO TABS: 1000.0000 mg | ORAL_TABLET | Freq: Two times a day (BID) | ORAL | 3 refills | Status: DC

## 2021-01-28 MED ORDER — ACCU-CHEK SOFTCLIX LANCETS MISC: 1.0000 | Freq: Four times a day (QID) | 2 refills | Status: DC

## 2021-01-28 NOTE — Progress Notes (Signed)
   PRENATAL VISIT NOTE  Subjective:  [name] is a 35 y.o. [phone] at [redacted]w[redacted]d being seen today for ongoing prenatal care.  She is currently monitored for the following issues for this high-risk pregnancy and has Immigrant with language difficulty; Language barrier; Previous cesarean delivery, antepartum condition or complication; Seasonal allergies; Female genital mutilation with clitorectomy; Hypopigmentation; Prediabetes; Vision disturbance; Encounter for supervision of high risk multigravida of advanced maternal age, antepartum; Rubella non-immune status, antepartum; and Gestational diabetes on their problem list.  Patient reports mild GI symptoms from metformin .  Contractions: Not present. Vag. Bleeding: None.  Movement: Present. Denies leaking of fluid.   The following portions of the patient's history were reviewed and updated as appropriate: allergies, current medications, past family history, past medical history, past social history, past surgical history and problem list.   Objective:   Vitals:   01/28/21 0832  BP: 108/74  Pulse: 80  Weight: 173 lb 12.8 oz (78.8 kg)    Fetal Status: Fetal Heart Rate (bpm): 160   Movement: Present     General:  Alert, oriented and cooperative. Patient is in no acute distress.  Skin: Skin is warm and dry. No rash noted.   Cardiovascular: Normal heart rate noted  Respiratory: Normal respiratory effort, no problems with respiration noted  Abdomen: Soft, gravid, appropriate for gestational age.  Pain/Pressure: Absent     Pelvic: Cervical exam deferred        Extremities: Normal range of motion.  Edema: None  Mental Status: Normal mood and affect. Normal behavior. Normal judgment and thought content.   Assessment and Plan:  Pregnancy: [id number] at [redacted]w[redacted]d 1. Encounter for supervision of high risk multigravida of advanced maternal age, antepartum 94 week labs today; RN looking for [facility] (should have been at initial prenatal visit)  2.  Gestational diabetes mellitus (GDM), antepartum, gestational diabetes method of control unspecified Mild elevations with about 1/3 of values.  Will increase metformin to 1000 bid.  If not better in 2 weeks will switch to insulin.  Lancets refilled.   Preterm labor symptoms and general obstetric precautions including but not limited to vaginal bleeding, contractions, leaking of fluid and fetal movement were reviewed in detail with the patient. Please refer to After Visit Summary for other counseling recommendations.   No follow-ups on file.  Future Appointments  Date Time Provider Department Center  02/18/2021 11:00 AM [facility] NURSE [facility]  02/18/2021 11:15 AM [facility] US2 [facility]    Elsie Lincoln, MD

## 2021-01-29 LAB — CBC
Hematocrit: 32.9 % — ABNORMAL LOW (ref 34.0–46.6)
Hemoglobin: 10.3 g/dL — ABNORMAL LOW (ref 11.1–15.9)
MCH: 24.2 pg — ABNORMAL LOW (ref 26.6–33.0)
MCHC: 31.3 g/dL — ABNORMAL LOW (ref 31.5–35.7)
MCV: 77 fL — ABNORMAL LOW (ref 79–97)
Platelets: 277 10*3/uL (ref 150–450)
RBC: 4.26 x10E6/uL (ref 3.77–5.28)
RDW: 16.9 % — ABNORMAL HIGH (ref 11.7–15.4)
WBC: 6.4 10*3/uL (ref 3.4–10.8)

## 2021-01-29 LAB — RPR: RPR Ser Ql: NONREACTIVE

## 2021-01-29 LAB — HIV ANTIBODY (ROUTINE TESTING W REFLEX): HIV Screen 4th Generation wRfx: NONREACTIVE

## 2021-01-31 DIAGNOSIS — Z419 Encounter for procedure for purposes other than remedying health state, unspecified: Secondary | ICD-10-CM | POA: Diagnosis not present

## 2021-02-02 DIAGNOSIS — Z3A26 26 weeks gestation of pregnancy: Secondary | ICD-10-CM | POA: Diagnosis not present

## 2021-02-02 DIAGNOSIS — O24419 Gestational diabetes mellitus in pregnancy, unspecified control: Secondary | ICD-10-CM | POA: Diagnosis not present

## 2021-02-11 ENCOUNTER — Ambulatory Visit (INDEPENDENT_AMBULATORY_CARE_PROVIDER_SITE_OTHER): Payer: 59 | Admitting: Obstetrics and Gynecology

## 2021-02-11 ENCOUNTER — Other Ambulatory Visit: Payer: Self-pay

## 2021-02-11 VITALS — BP 108/77 | HR 87 | Wt 173.0 lb

## 2021-02-11 DIAGNOSIS — O09529 Supervision of elderly multigravida, unspecified trimester: Secondary | ICD-10-CM

## 2021-02-11 DIAGNOSIS — O24415 Gestational diabetes mellitus in pregnancy, controlled by oral hypoglycemic drugs: Secondary | ICD-10-CM

## 2021-02-11 DIAGNOSIS — D509 Iron deficiency anemia, unspecified: Secondary | ICD-10-CM

## 2021-02-11 DIAGNOSIS — Z3A28 28 weeks gestation of pregnancy: Secondary | ICD-10-CM

## 2021-02-11 DIAGNOSIS — Z789 Other specified health status: Secondary | ICD-10-CM

## 2021-02-11 DIAGNOSIS — Z603 Acculturation difficulty: Secondary | ICD-10-CM

## 2021-02-11 DIAGNOSIS — O34219 Maternal care for unspecified type scar from previous cesarean delivery: Secondary | ICD-10-CM

## 2021-02-11 DIAGNOSIS — N90810 Female genital mutilation status, unspecified: Secondary | ICD-10-CM

## 2021-02-11 LAB — POCT URINALYSIS DIP (DEVICE)
Bilirubin Urine: NEGATIVE
Glucose, UA: NEGATIVE mg/dL
Hgb urine dipstick: NEGATIVE
Leukocytes,Ua: NEGATIVE
Nitrite: NEGATIVE
Protein, ur: NEGATIVE mg/dL
Specific Gravity, Urine: >=1.03 (ref 1.005–1.030)
Urobilinogen, UA: 0.2 mg/dL (ref 0.0–1.0)
pH: 5.5 (ref 5.0–8.0)

## 2021-02-11 NOTE — Patient Instructions (Signed)
AREA PEDIATRIC/FAMILY PRACTICE PHYSICIANS  [place] ([address]) . [facility] o Chambliss, MD; Eniola, MD; Hale, MD; Hensel, MD; McDiarmid, MD; McIntyer, MD; Neal, MD; Walden, MD o [address] [place], [place] [address] o [phone] o Mon-Fri 8:30-12:30, 1:30-5:00 o Providers come to see babies at [facility] o Accepting Medicaid . [facility] o Limited providers who accept newborns: Koirala, MD; Morrow, MD; Wolters, MD o [address], [place], [place] [address] o [phone] o Mon-Fri 8:00-5:30 o Babies seen by providers at [facility] o Does NOT accept Medicaid o Please call early in hospitalization for appointment (limited availability)  . [facility] o Mulberry, MD o [address] [place], [place] [address] o [phone] o Mon, Tue, Thur, Fri 8:30-5:00, Wed 10:00-7:00 (closed 1-2pm) o Babies seen by [facility] providers o Accepting Medicaid . [facility] - Pediatrician o Rubin, MD o [address], [place], [place] [address] o [phone] o Mon-Fri 8:30-5:00, Sat 8:30-12:00 o Provider comes to see babies at [facility] o Accepting Medicaid o Must have been referred from current patients or contacted office prior to delivery . [facility] ([facility]) o Brown, MD; Chandler, MD; Ettefagh, MD; Grant, MD; Lester, MD; McCormick, MD; McQueen, MD; Prose, MD; Simha, MD; Stanley, MD; Stryffeler, NP; Tebben, NP o [address], [place], [place] [address] o [phone] o Mon, Tue, Thur, Fri 8:30-5:30, Wed 9:30-5:30, Sat 8:30-12:30 o Babies seen by [facility] providers o Accepting Medicaid o Only accepting infants of first-time parents or siblings of current patients o Hospital discharge coordinator will make follow-up appointment . Jack Amos o [address],  [place], [place]  [address] o [phone]   Fax - [phone] . [facility] o [address], [place], [place]  [address] o Phone - [phone]   Fax - [phone] . Shilpa Gosrani o [address], [place], [place]  [address] o [phone]  [place] ([address]) . [facility] o Bates, MD; Brassfield, MD; Cooper, Cox, MD; MD; Davis, MD; Dovico, MD; Ettefaugh, MD; Little, MD; Lowe, MD; Keiffer, MD; Melvin, MD; Sumner, MD; Williams, MD o [address], [place], [place] [address] o [phone] o Mon-Fri 8:30-5:00 (extended evenings Mon-Thur as needed), Sat-Sun 10:00-1:00 o Providers come to see babies at [facility] o Accepting Medicaid for families of first-time babies and families with all children in the household age 3 and under. Must register with office prior to making appointment (M-F only). . [facility] o Henson, NP; Knapp, MD; Lalonde, MD; Tysinger, PA o [address] [place], [place] [address] o [phone] o Mon-Fri 8:00-5:00 o Babies seen by providers at [facility] o Does NOT accept Medicaid/Commercial Insurance Only . [facility] ([facility]  o Artis, MD; Barnes, MD; Bratton, MD; Coccaro, MD; Lockett Gardner, MD; Kramer, MD; Marshall, MD; Netherton, MD; Poleto, MD; Skinner, MD o [address] [place], [place] [address] o [phone] o Mon-Fri 8:30-5:30, Sat (Oct.-Mar.) 9:00-1:00 o Babies seen by providers at [facility] o Accepting Medicaid  [place] ([address]) . [facility]  o Reid, MD; Warner, MD o [address], ,  [address] o [phone] o Mon-Fri 8:30-5:00, Sat 8:30-12:00 o Providers come to see babies at [facility] o Does NOT accept Medicaid . [facility] o Becker, PA; Hagler, MD; Scifres, PA; Sun, MD; Swayne, MD o [address],  [place], [place] [address] o [phone] o Mon-Fri 8:00-5:00 o Babies seen by providers at [facility] o Does NOT accept Medicaid o Only accepting babies of parents who are patients o Please call early in hospitalization for appointment (limited availability) . [facility] o Clark, MD; Frye, MD; Kelleher, MD; Mack, NP; Miller, MD; O'Keller, MD; Patterson, NP; Pudlo, MD; Puzio, MD; Thomas, MD; Tucker, MD; Twiselton, MD o [address], [place], [place] [address] o [phone] o Mon-Fri 8:00-5:00, Sat 9:00-12:00 o Providers come to see babies at [facility] o Does NOT accept Medicaid  [place] ([address]) . [facility] o Limited providers accepting new patients: Brake, NP; Wharton, PA o [address], [place], [place] [address] o [phone] o Mon-Fri 8:00-5:00 o Babies seen by providers at [facility] o Does NOT accept Medicaid o Only accepting babies of parents who are patients o Please call early in hospitalization for appointment (limited availability) . [facility] o Gay, MD; Quinlan, MD o [address] [place], [place] [address] o [phone] (press 1 to schedule appointment) o Mon-Fri 8:00-5:00 o Providers come to see babies at [facility] o Does NOT accept Medicaid . [facility] o Mazer, MD o [address] [place], [place] [address] o [phone] o Mon-Fri 8:30-5:00 (lunch 12:30-1:00), extended hours by appointment only Wed 5:00-6:30 o Babies seen by [facility] providers o Accepting Medicaid . [facility] o Banks, MD; Jordan, MD; Koberlein, MD o [address], [place], [place] [address] o [phone] o Mon-Fri 8:00-5:00 o Babies seen by [facility] providers o Does NOT accept Medicaid . [facility] o Parker, MD; Hunter, MD; Wallace, DO o [address] [place], [place]  [address] o [phone] o Mon-Fri 8:00-5:00 o Babies seen by [facility] providers o Does NOT accept Medicaid . [facility] o Brandon, PA; Brecken, PA; Christy, NP; Dees, MD; DeClaire, MD; DeWeese, MD; Hansen, NP; Mills, NP; Parrish, NP; Smoot, NP; Summer, MD; Vapne, MD o [address] [place], [place] [address] o [phone] o Mon-Fri 8:30-5:00, Sat 10:00-1:00 o Providers come to see babies at [facility] o Does NOT accept Medicaid o Free prenatal information session Tuesdays at 4:45pm . [facility] o Bouska, MD; Gordon, PA; Jeffery, PA; Weber, PA o [address] [place] [address] o [phone] o Mon-Fri 7:30-5:30 o Babies seen by [facility] providers . [facility] o [address], [place], [place]  [address] o [phone]   Fax - [phone]  [place] ([address] & [address]) . [facility] o [place]ese, [place] o [address] [place], [place] [address] o [phone] o Mon-Thur 8:00-6:00 o Providers come to see babies at [facility] o Accepting Medicaid . [facility] o Anderson, NP; Badger, MD; Beal, PA; Spencer, PA o [address] [place], [place] [address] o [phone] o Mon-Thur 7:30-7:30, Fri 7:30-4:30 o Babies seen by [facility] providers o Accepting Medicaid . [facility] o Agbuya, MD; Klett, NP; Romgoolam, MD o [address], [place], [place] [address] o [phone] o Mon-Fri 8:30-5:00, Sat 8:30-12:00 o Providers come to see babies at [facility] o Accepting Medicaid o Must have "Meet & Greet" appointment at office prior to delivery . [facility] o McCord,   MD; Wallace, MD; Wood, MD o [address], [place], [place] [address] o [phone] o Mon-Wed 8:00-6:00, Thur-Fri 8:00-5:00, Sat 9:00-12:00 o Providers come to  see babies at [facility] o Does NOT accept Medicaid o Only accepting siblings of current patients . [facility]  o [address], [place], [place]  [address] o [phone]   Fax - [phone] . [facility] o [address], [place], [place]  [address] o [phone]   Fax - [phone]  [place] ([address]) . [facility] o Cirigliano, DO; Matthews, DO o [address] [place], [place] [address] o [phone] o Mon-Fri 7:00-5:00 o Babies seen by [facility] providers o Does NOT accept Medicaid . [facility] o Briscoe, MD; Howley, PA; Moreira, PA o [address], [place], [place] [address] o [phone] o Mon-Fri 8:00-5:00 o Babies seen by [facility] providers o Accepting Medicaid . [facility] o Boyd, MD; Church, PA; Jones, NP; Osborn, PA o [address], [place], [place] [address] o [phone] o Mon-Fri 8:00-5:00 o Babies seen by providers at [facility] o Accepting Medicaid  [place] ([address]) .  [facility] [place] o Wendling, DO o [address] [place], [place] [address] o [phone] o Mon-Fri 8:00-5:00 o Babies seen by [facility] providers o Does NOT accept Medicaid o Limited availability, please call early in hospitalization to schedule follow-up . [facility] o Calderon, PA; Cummings, MD; Dillard, MD; Martin, PA; Olson, MD; VanDeven, PA o [address], [place], [place] [address] o [phone] o Mon-Fri 8:30-5:00, Sat 9:00-12:00 o Babies seen by providers at [facility] o Accepting Medicaid o Please register online then schedule online or call office o [email or web address] . [facility] o Hunter, NP; Kumar, MD; Martin Rogers, PA o [address], [place], [place] [address] o [phone] o Mon-Fri 8:00-5:00 o Babies seen by providers at [facility] o Accepting Medicaid . [facility] ([facility] o Linn, MD; Kristi Fleenor, NP; West, MD o [address], [place], [place] [address] o [phone] o Mon-Fri 8:00-5:30, Sat&Sun by appointment (phones open at 8:30) o Babies seen by [facility] providers o Accepting Medicaid o Must be a first-time baby or sibling of current patient . [facility] [place]  o [address], [place], [place]  [address] o [phone]   Fax - [phone]  [place] ([address]) . [facility] o Brown, PA; Cowen, PA; Rice, MD; Helton, PA; Spry, MD o [address] [place], [place] [address] o [phone] o Mon-Thur 8:00-7:00, Fri 8:00-5:00, Sat 8:00-12:00, Sun 9:00-12:00 o Babies seen by [facility] providers o Accepting Medicaid . [facility] o Coe-Goins, MD; Marshall, MD; Pierre-Louis, MD o [address], [place], [place] [address] o [phone] o Mon-Thur 8:00-5:00 o Babies seen by providers at [facility] o Accepting Medicaid . [facility] o Bratton, MD; Coe-Goins, MD; Hayes, MD; Lewis, MD; List, MD; Lott, MD; Marshall, MD; Moran, MD; O'Neal, MD; Pierre-Louis, MD; Pitonzo, MD; Scholer, MD; Spangle, MD o [address] [place], [place]   [address] o [phone] o Mon-Fri 8:00-5:30, Sat (Oct.-Mar.) 9:00-1:00 o Babies seen by providers at [facility] o Accepting Medicaid o Must fill out new patient packet, available online at [email or web address] . [facility] ([facility]) o Friddle, NP; Harris, NP; Kelly, NP; Logan, MD;  Melvin, PA; Poth, MD; Ramadoss, MD; Stanton, NP o [address], [place], [place] [address] o [phone] o Mon-Thur 8:00-5:30, Fri 8:00-5:00 o Babies seen by providers at [facility] o Accepting Medicaid  [place] ([address]) . [facility] o Dixon, PA; Tracyton, MD; Pickard, MD; Tapia, PA o [address], [place], [place] [address] o [phone] o Mon-Fri 8:00-5:00 o Babies seen by providers at [facility] o Accepting Medicaid   [place] ([address]) . [facility] [place] o Masneri, DO; Meyers, MD; [place], PA o [address], [place], [place] [address] o [phone] o Mon-Fri 8:00-5:00 o Babies seen by providers at [facility] o Does NOT accept Medicaid o Limited appointment availability, please call early in hospitalization  . [facility] [place] o Kunedd, DO; McGowen, MD o [address], [place], [place] [address] o [phone] o Mon-Fri 8:00-5:00 o Babies seen by [facility] providers o Does NOT accept Medicaid . [facility] [place] o Cameron, MD; MacDonald, MD; Michaels, PA; Nayak, MD o [address] [place] [address], [place], [place] [address] o [phone] o Mon-Fri 8:00-5:00 o After hours clinic ([address] [place], [place] [address]) [phone] Mon-Fri 5:00-8:00, Sat 12:00-6:00, Sun 10:00-4:00 o Babies seen by [facility] providers o Accepting Medicaid . [facility] [place] o [address], [place], [place]  [address] o [phone]   Fax - [phone]  [place] ([address]) . [facility] [place] [facility] o Andy, MD o [address], [place], [place] [address] o [phone] o Mon-Fri 8:00-5:00 o Babies seen by [facility] providers o Does NOT accept Medicaid . [facility] [place] ([facility] [place]) o Eksir, MD o [address], [place],   [address] o [phone] o Mon-Thur 8:00-7:00, Fri 8:00-5:00, Sat 8:00-12:00 o Babies seen by providers at [facility] o Accepting Medicaid - but does not have vaccinations in office (must be received elsewhere) o Limited availability, please call early in hospitalization  [place] ([address]) . [facility]  o Charlene Flemming, MD o [address], [place]  [address] o [phone]  Fax [phone]   

## 2021-02-11 NOTE — Progress Notes (Signed)
   PRENATAL VISIT NOTE  Subjective:  [name] is a 35 y.o. [phone] at [redacted]w[redacted]d being seen today for ongoing prenatal care.  She is currently monitored for the following issues for this high-risk pregnancy and has Immigrant with language difficulty; Language barrier; Previous cesarean delivery, antepartum condition or complication; Seasonal allergies; Female genital mutilation with clitorectomy; Hypopigmentation; Prediabetes; Vision disturbance; Encounter for supervision of high risk multigravida of advanced maternal age, antepartum; Rubella non-immune status, antepartum; and Gestational diabetes on their problem list.  Patient reports no complaints.  Contractions: Not present. Vag. Bleeding: None.  Movement: Present. Denies leaking of fluid.   Taking metformin 1000mg  BID and tolerating well. CBG log reviewed and >90% at goal. Highest CBG 127 after meal.   The following portions of the patient's history were reviewed and updated as appropriate: allergies, current medications, past family history, past medical history, past social history, past surgical history and problem list.   Objective:   Vitals:   02/11/21 1047  BP: 108/77  Pulse: 87  Weight: 173 lb (78.5 kg)    Fetal Status: Fetal Heart Rate (bpm): 161   Movement: Present     General:  Alert, oriented and cooperative. Patient is in no acute distress.  Skin: Skin is warm and dry. No rash noted.   Cardiovascular: Normal heart rate noted  Respiratory: Normal respiratory effort, no problems with respiration noted  Abdomen: Soft, gravid, appropriate for gestational age.  Pain/Pressure: Present     Pelvic: Cervical exam deferred        Extremities: Normal range of motion.  Edema: Trace  Mental Status: Normal mood and affect. Normal behavior. Normal judgment and thought content.   Assessment and Plan:  Pregnancy: [id number] at [redacted]w[redacted]d 1. Encounter for supervision of high risk multigravida of advanced maternal age, antepartum    2.  Language barrier   3. Previous cesarean delivery, antepartum condition or complication -for repeat CS at 39 wk  4. Female circumcision   5. [redacted] weeks gestation of pregnancy   6. Gestational diabetes mellitus (GDM) in second trimester controlled on oral hypoglycemic drug -congratulated on excellent glycemic control. Continue BID metformin 1000mg . Encouraged to continue monitoring CBG and bringing logs to appointments. Has growth [redacted]w[redacted]d on 5/19.   7. Iron Deficiency Anemia -continue PO iron. Instructed on taking in AM with vitamin C/orange juice   Preterm labor symptoms and general obstetric precautions including but not limited to vaginal bleeding, contractions, leaking of fluid and fetal movement were reviewed in detail with the patient. Please refer to After Visit Summary for other counseling recommendations.   No follow-ups on file.  Future Appointments  Date Time Provider Department Center  02/18/2021 11:00 AM [facility] NURSE [facility]  02/18/2021 11:15 AM [facility] US2 [facility]    [facility] MD

## 2021-02-14 ENCOUNTER — Encounter: Payer: Self-pay | Admitting: Obstetrics & Gynecology

## 2021-02-15 ENCOUNTER — Telehealth: Payer: Self-pay | Admitting: *Deleted

## 2021-02-15 MED ORDER — FERROUS SULFATE 325 (65 FE) MG PO TABS: 325.0000 mg | ORAL_TABLET | Freq: Every day | ORAL | 3 refills | Status: DC

## 2021-02-15 NOTE — Telephone Encounter (Addendum)
Called pt w/[employer] interpreter # [id number]. Pt did not answer and interpreter stated that the outgoing message stated that the call could not be completed @ this time. Pt needs to be informed that Dr. Penne Lash has reviewed her recent iron level result and it is still low. She would like pt to be called and questioned whether she has been taking the iron tablets every other Shakur Lembo as prescribed. If she has, then pt needs to increase the dose to one tablet daily. She should be sure to take with a few ounces of orange juice.  New Rx has been sent to her pharmacy.   1530  Received new message from Dr. Penne Lash who stated that she is considering Fereheme infusion for pt if she confirms taking medication as directed every other Tiawanna Luchsinger rather than increasing dose.

## 2021-02-16 NOTE — Telephone Encounter (Signed)
I called patient with [employer]preter# [phone] to her mobile number and left a message we are trying to reach [place] with information from her doctor . I called her home number and reached a female who identified as her spouse who then got Be[place]. I informed her of results per Dr. Penne Lash that her iron is low. I clarified she is taking 1 iron tablet daily with a meal. I informed her that I will pass information on to Dr. Penne Lash who is considering IV iron and if she does order that , we will call her back with appointment and more information. She voices understanding. Khadeem Rockett,RN

## 2021-02-18 ENCOUNTER — Other Ambulatory Visit: Payer: Self-pay

## 2021-02-18 ENCOUNTER — Ambulatory Visit: Payer: Medicaid Other | Attending: Obstetrics

## 2021-02-18 ENCOUNTER — Ambulatory Visit: Payer: Medicaid Other | Admitting: *Deleted

## 2021-02-18 VITALS — BP 108/69 | HR 84

## 2021-02-18 DIAGNOSIS — O321XX0 Maternal care for breech presentation, not applicable or unspecified: Secondary | ICD-10-CM

## 2021-02-18 DIAGNOSIS — O09523 Supervision of elderly multigravida, third trimester: Secondary | ICD-10-CM | POA: Diagnosis not present

## 2021-02-18 DIAGNOSIS — Z8632 Personal history of gestational diabetes: Secondary | ICD-10-CM

## 2021-02-18 DIAGNOSIS — Z3A29 29 weeks gestation of pregnancy: Secondary | ICD-10-CM

## 2021-02-18 DIAGNOSIS — D649 Anemia, unspecified: Secondary | ICD-10-CM | POA: Diagnosis not present

## 2021-02-18 DIAGNOSIS — O99891 Other specified diseases and conditions complicating pregnancy: Secondary | ICD-10-CM

## 2021-02-18 DIAGNOSIS — O34219 Maternal care for unspecified type scar from previous cesarean delivery: Secondary | ICD-10-CM

## 2021-02-18 DIAGNOSIS — N90810 Female genital mutilation status, unspecified: Secondary | ICD-10-CM

## 2021-02-18 DIAGNOSIS — O99013 Anemia complicating pregnancy, third trimester: Secondary | ICD-10-CM

## 2021-02-18 DIAGNOSIS — O24415 Gestational diabetes mellitus in pregnancy, controlled by oral hypoglycemic drugs: Secondary | ICD-10-CM | POA: Diagnosis not present

## 2021-02-18 DIAGNOSIS — O24410 Gestational diabetes mellitus in pregnancy, diet controlled: Secondary | ICD-10-CM

## 2021-02-19 ENCOUNTER — Other Ambulatory Visit: Payer: Self-pay | Admitting: Obstetrics

## 2021-02-19 DIAGNOSIS — O24415 Gestational diabetes mellitus in pregnancy, controlled by oral hypoglycemic drugs: Secondary | ICD-10-CM

## 2021-02-26 ENCOUNTER — Encounter: Payer: Self-pay | Admitting: Medical

## 2021-02-26 ENCOUNTER — Ambulatory Visit (INDEPENDENT_AMBULATORY_CARE_PROVIDER_SITE_OTHER): Payer: 59 | Admitting: Medical

## 2021-02-26 ENCOUNTER — Other Ambulatory Visit: Payer: Self-pay

## 2021-02-26 VITALS — BP 94/72 | HR 78 | Wt 175.0 lb

## 2021-02-26 DIAGNOSIS — O09899 Supervision of other high risk pregnancies, unspecified trimester: Secondary | ICD-10-CM

## 2021-02-26 DIAGNOSIS — O24415 Gestational diabetes mellitus in pregnancy, controlled by oral hypoglycemic drugs: Secondary | ICD-10-CM

## 2021-02-26 DIAGNOSIS — Z3A30 30 weeks gestation of pregnancy: Secondary | ICD-10-CM

## 2021-02-26 DIAGNOSIS — O99013 Anemia complicating pregnancy, third trimester: Secondary | ICD-10-CM

## 2021-02-26 DIAGNOSIS — Z2839 Other underimmunization status: Secondary | ICD-10-CM

## 2021-02-26 DIAGNOSIS — O09529 Supervision of elderly multigravida, unspecified trimester: Secondary | ICD-10-CM

## 2021-02-26 DIAGNOSIS — Z603 Acculturation difficulty: Secondary | ICD-10-CM

## 2021-02-26 DIAGNOSIS — O34219 Maternal care for unspecified type scar from previous cesarean delivery: Secondary | ICD-10-CM

## 2021-02-26 LAB — POCT URINALYSIS DIP (DEVICE)
Bilirubin Urine: NEGATIVE
Glucose, UA: NEGATIVE mg/dL
Hgb urine dipstick: NEGATIVE
Ketones, ur: 15 mg/dL — AB
Leukocytes,Ua: NEGATIVE
Nitrite: NEGATIVE
Protein, ur: NEGATIVE mg/dL
Specific Gravity, Urine: 1.025 (ref 1.005–1.030)
Urobilinogen, UA: 0.2 mg/dL (ref 0.0–1.0)
pH: 5.5 (ref 5.0–8.0)

## 2021-02-26 NOTE — Progress Notes (Signed)
PRENATAL VISIT NOTE  Subjective:  [name] is a 35 y.o. [phone] at 95w1dbeing seen today for ongoing prenatal care.  She is currently monitored for the following issues for this high-risk pregnancy and has Immigrant with language difficulty; Language barrier; Anemia affecting pregnancy; Previous cesarean delivery, antepartum condition or complication; Seasonal allergies; Female genital mutilation with clitorectomy; Hypopigmentation; Prediabetes; Vision disturbance; Encounter for supervision of high risk multigravida of advanced maternal age, antepartum; Rubella non-immune status, antepartum; and Gestational diabetes on their problem list.  Patient reports nausea and vomiting.  Contractions: Not present. Vag. Bleeding: None.  Movement: Present. Denies leaking of fluid.   The following portions of the patient's history were reviewed and updated as appropriate: allergies, current medications, past family history, past medical history, past social history, past surgical history and problem list.   Objective:   Vitals:   02/26/21 0935  BP: 94/72  Pulse: 78  Weight: 175 lb (79.4 kg)    Fetal Status: Fetal Heart Rate (bpm): 155   Movement: Present     General:  Alert, oriented and cooperative. Patient is in no acute distress.  Skin: Skin is warm and dry. No rash noted.   Cardiovascular: Normal heart rate noted  Respiratory: Normal respiratory effort, no problems with respiration noted  Abdomen: Soft, gravid, appropriate for gestational age.  Pain/Pressure: Present     Pelvic: Cervical exam deferred        Extremities: Normal range of motion.  Edema: None  Mental Status: Normal mood and affect. Normal behavior. Normal judgment and thought content.   Assessment and Plan:  Pregnancy: [id number] 336w1d. Encounter for supervision of high risk multigravida of advanced maternal age, antepartum - Planning [facility] for Peds  - Some increase in N/V recently, has b6, offered other medications  today and patient declined  2. Rubella non-immune status, antepartum - PP MMR  3. Previous cesarean delivery, antepartum condition or complication - Planning rLTCS, message sent to schedule at 39 weeks  - Planning Liletta insertion PP  4. Immigrant with language difficulty - Interpreter utilized   5. [redacted] weeks gestation of pregnancy  6. Anemia affecting pregnancy in third trimester - Taking iron, advised every other day is sufficient due to stomach upset   7. Gestational diabetes mellitus (GDM) in third trimester controlled on oral hypoglycemic drug - does not have log, states fasting 95-104 and PP 105-140 with most < 120 - BPP on 6/9 with MFM - BPP and growth on 6/17 with MFM - Continue Metformin as directed and bring log to every visit  Preterm labor symptoms and general obstetric precautions including but not limited to vaginal bleeding, contractions, leaking of fluid and fetal movement were reviewed in detail with the patient. Please refer to After Visit Summary for other counseling recommendations.   Return in about 2 weeks (around 03/12/2021) [facility].  Future Appointments  Date Time Provider [place]6/06/2021 11:15 AM [facility] NURSE [facility]2021 11:30 AM [facility]15[facility]2022 10:15 AM Wa[facility]riel [facility]CN[facility]/17/2022  3:00 PM [facility] NURSE [facility]  3:15 PM [facility] US2 [facility] [address]  JuKerry HoughPA-C

## 2021-02-26 NOTE — Patient Instructions (Addendum)
Fetal Movement Counts Patient Name: ________________________________________________ Patient Due Date: ____________________  What is a fetal movement count? A fetal movement count is the number of times that you feel your baby move during a certain amount of time. This may also be called a fetal kick count. A fetal movement count is recommended for every pregnant woman. You may be asked to start counting fetal movements as early as week 28 of your pregnancy. Pay attention to when your baby is most active. You may notice your baby's sleep and wake cycles. You may also notice things that make your baby move more. You should do a fetal movement count:  When your baby is normally most active.  At the same time each day. A good time to count movements is while you are resting, after having something to eat and drink. How do I count fetal movements? 1. Find a quiet, comfortable area. Sit, or lie down on your side. 2. Write down the date, the start time and stop time, and the number of movements that you felt between those two times. Take this information with you to your health care visits. 3. Write down your start time when you feel the first movement. 4. Count kicks, flutters, swishes, rolls, and jabs. You should feel at least 10 movements. 5. You may stop counting after you have felt 10 movements, or if you have been counting for 2 hours. Write down the stop time. 6. If you do not feel 10 movements in 2 hours, contact your health care provider for further instructions. Your health care provider may want to do additional tests to assess your baby's well-being. Contact a health care provider if:  You feel fewer than 10 movements in 2 hours.  Your baby is not moving like he or she usually does. Date: ____________ Start time: ____________ Stop time: ____________ Movements: ____________ Date: ____________ Start time: ____________ Stop time: ____________ Movements: ____________ Date: ____________  Start time: ____________ Stop time: ____________ Movements: ____________ Date: ____________ Start time: ____________ Stop time: ____________ Movements: ____________ Date: ____________ Start time: ____________ Stop time: ____________ Movements: ____________ Date: ____________ Start time: ____________ Stop time: ____________ Movements: ____________ Date: ____________ Start time: ____________ Stop time: ____________ Movements: ____________ Date: ____________ Start time: ____________ Stop time: ____________ Movements: ____________ Date: ____________ Start time: ____________ Stop time: ____________ Movements: ____________ This information is not intended to replace advice given to you by your health care provider. Make sure you discuss any questions you have with your health care provider. Document Revised: 05/09/2019 Document Reviewed: 05/09/2019 Elsevier Patient Education  2021 Elsevier Inc. Rosen's Emergency Medicine: Concepts and Clinical Practice (9th ed., pp. 2296- 2312). Elsevier.">    Braxton Hicks Contractions Contractions of the uterus can occur throughout pregnancy, but they are not always a sign that you are in labor. You may have practice contractions called Braxton Hicks contractions. These false labor contractions are sometimes confused with true labor. What are Braxton Hicks contractions? Braxton Hicks contractions are tightening movements that occur in the muscles of the uterus before labor. Unlike true labor contractions, these contractions do not result in opening (dilation) and thinning of the cervix. Toward the end of pregnancy (32-34 weeks), Braxton Hicks contractions can happen more often and may become stronger. These contractions are sometimes difficult to tell apart from true labor because they can be very uncomfortable. You should not feel embarrassed if you go to the hospital with false labor. Sometimes, the only way to tell if you are in true labor is   for your health care  provider to look for changes in the cervix. The health care provider will do a physical exam and may monitor your contractions. If you are not in true labor, the exam should show that your cervix is not dilating and your water has not broken. If there are no other health problems associated with your pregnancy, it is completely safe for you to be sent home with false labor. You may continue to have Braxton Hicks contractions until you go into true labor. How to tell the difference between true labor and false labor True labor  Contractions last 30-70 seconds.  Contractions become very regular.  Discomfort is usually felt in the top of the uterus, and it spreads to the lower abdomen and low back.  Contractions do not go away with walking.  Contractions usually become more intense and increase in frequency.  The cervix dilates and gets thinner. False labor  Contractions are usually shorter and not as strong as true labor contractions.  Contractions are usually irregular.  Contractions are often felt in the front of the lower abdomen and in the groin.  Contractions may go away when you walk around or change positions while lying down.  Contractions get weaker and are shorter-lasting as time goes on.  The cervix usually does not dilate or become thin. Follow these instructions at home:  Take over-the-counter and prescription medicines only as told by your health care provider.  Keep up with your usual exercises and follow other instructions from your health care provider.  Eat and drink lightly if you think you are going into labor.  If Braxton Hicks contractions are making you uncomfortable: ? Change your position from lying down or resting to walking, or change from walking to resting. ? Sit and rest in a tub of warm water. ? Drink enough fluid to keep your urine pale yellow. Dehydration may cause these contractions. ? Do slow and deep breathing several times an hour.  Keep  all follow-up prenatal visits as told by your health care provider. This is important.   Contact a health care provider if:  You have a fever.  You have continuous pain in your abdomen. Get help right away if:  Your contractions become stronger, more regular, and closer together.  You have fluid leaking or gushing from your vagina.  You pass blood-tinged mucus (bloody show).  You have bleeding from your vagina.  You have low back pain that you never had before.  You feel your baby's head pushing down and causing pelvic pressure.  Your baby is not moving inside you as much as it used to. Summary  Contractions that occur before labor are called Braxton Hicks contractions, false labor, or practice contractions.  Braxton Hicks contractions are usually shorter, weaker, farther apart, and less regular than true labor contractions. True labor contractions usually become progressively stronger and regular, and they become more frequent.  Manage discomfort from Braxton Hicks contractions by changing position, resting in a warm bath, drinking plenty of water, or practicing deep breathing. This information is not intended to replace advice given to you by your health care provider. Make sure you discuss any questions you have with your health care provider. Document Revised: 09/01/2017 Document Reviewed: 02/02/2017 Elsevier Patient Education  2021 Elsevier Inc.  

## 2021-03-03 ENCOUNTER — Encounter: Payer: Self-pay | Admitting: *Deleted

## 2021-03-03 DIAGNOSIS — Z419 Encounter for procedure for purposes other than remedying health state, unspecified: Secondary | ICD-10-CM | POA: Diagnosis not present

## 2021-03-11 ENCOUNTER — Telehealth: Payer: Self-pay

## 2021-03-11 ENCOUNTER — Encounter: Payer: Self-pay | Admitting: *Deleted

## 2021-03-11 ENCOUNTER — Ambulatory Visit: Payer: Medicaid Other | Admitting: *Deleted

## 2021-03-11 ENCOUNTER — Other Ambulatory Visit: Payer: Self-pay

## 2021-03-11 ENCOUNTER — Other Ambulatory Visit: Payer: Self-pay | Admitting: *Deleted

## 2021-03-11 ENCOUNTER — Ambulatory Visit: Payer: Medicaid Other | Attending: Obstetrics

## 2021-03-11 VITALS — BP 95/54 | HR 94

## 2021-03-11 DIAGNOSIS — O24415 Gestational diabetes mellitus in pregnancy, controlled by oral hypoglycemic drugs: Secondary | ICD-10-CM | POA: Diagnosis not present

## 2021-03-11 DIAGNOSIS — O321XX0 Maternal care for breech presentation, not applicable or unspecified: Secondary | ICD-10-CM

## 2021-03-11 DIAGNOSIS — O99013 Anemia complicating pregnancy, third trimester: Secondary | ICD-10-CM

## 2021-03-11 DIAGNOSIS — O99891 Other specified diseases and conditions complicating pregnancy: Secondary | ICD-10-CM

## 2021-03-11 DIAGNOSIS — O24419 Gestational diabetes mellitus in pregnancy, unspecified control: Secondary | ICD-10-CM

## 2021-03-11 DIAGNOSIS — Z3A32 32 weeks gestation of pregnancy: Secondary | ICD-10-CM

## 2021-03-11 DIAGNOSIS — Z8632 Personal history of gestational diabetes: Secondary | ICD-10-CM | POA: Diagnosis not present

## 2021-03-11 DIAGNOSIS — O34219 Maternal care for unspecified type scar from previous cesarean delivery: Secondary | ICD-10-CM | POA: Diagnosis not present

## 2021-03-11 DIAGNOSIS — N90810 Female genital mutilation status, unspecified: Secondary | ICD-10-CM | POA: Diagnosis not present

## 2021-03-11 DIAGNOSIS — D649 Anemia, unspecified: Secondary | ICD-10-CM | POA: Diagnosis not present

## 2021-03-11 DIAGNOSIS — O09523 Supervision of elderly multigravida, third trimester: Secondary | ICD-10-CM | POA: Diagnosis not present

## 2021-03-11 NOTE — Telephone Encounter (Signed)
CBC future order placed per Dr Penne Lash.  Pt has next appt on 03/17/21 for ROB. Pt has hx of anemia.   Judeth Cornfield, RN

## 2021-03-11 NOTE — Telephone Encounter (Signed)
-----   Message from Lesly Dukes, MD sent at 03/11/2021  6:03 AM EDT ----- Can you please put in an order for a cbc at her next visit. Thanks, [facility]

## 2021-03-17 ENCOUNTER — Other Ambulatory Visit: Payer: Self-pay

## 2021-03-17 ENCOUNTER — Ambulatory Visit (INDEPENDENT_AMBULATORY_CARE_PROVIDER_SITE_OTHER): Payer: Medicaid Other | Admitting: Certified Nurse Midwife

## 2021-03-17 VITALS — BP 108/80 | HR 97 | Wt 177.5 lb

## 2021-03-17 DIAGNOSIS — Z3A32 32 weeks gestation of pregnancy: Secondary | ICD-10-CM

## 2021-03-17 DIAGNOSIS — O09529 Supervision of elderly multigravida, unspecified trimester: Secondary | ICD-10-CM

## 2021-03-17 DIAGNOSIS — O24414 Gestational diabetes mellitus in pregnancy, insulin controlled: Secondary | ICD-10-CM

## 2021-03-17 MED ORDER — "INSULIN SYRINGE 31G X 5/16"" 0.3 ML MISC": 1.0000 | Freq: Two times a day (BID) | 3 refills | Status: DC

## 2021-03-17 MED ORDER — INSULIN NPH (HUMAN) (ISOPHANE) 100 UNIT/ML ~~LOC~~ SUSP: 10.0000 [IU] | Freq: Two times a day (BID) | SUBCUTANEOUS | 3 refills | Status: DC

## 2021-03-17 NOTE — Progress Notes (Signed)
PRENATAL VISIT NOTE  Subjective:  [name] is a 35 y.o. [phone] at [redacted]w[redacted]d being seen today for ongoing prenatal care.  She is currently monitored for the following issues for this high-risk pregnancy and has Immigrant with language difficulty; Language barrier; Anemia affecting pregnancy; Previous cesarean delivery, antepartum condition or complication; Seasonal allergies; Female genital mutilation with clitorectomy; Hypopigmentation; Prediabetes; Vision disturbance; Encounter for supervision of high risk multigravida of advanced maternal age, antepartum; Rubella non-immune status, antepartum; and Gestational diabetes on their problem list.  Patient reports  no complaints other than some pressure in her upper abdomen from the baby's head (breech presentation per last ultrasound) .  Contractions: Not present. Vag. Bleeding: None.  Movement: Present. Denies leaking of fluid.   The following portions of the patient's history were reviewed and updated as appropriate: allergies, current medications, past family history, past medical history, past social history, past surgical history and problem list.   Arabic interpreter Ralene Ok) present throughout visit for interpretation services.  Objective:   Vitals:   03/17/21 1043  BP: 108/80  Pulse: 97  Weight: 177 lb 8 oz (80.5 kg)    Fetal Status: Fetal Heart Rate (bpm): 152 Fundal Height: 32 cm Movement: Present     General:  Alert, oriented and cooperative. Patient is in no acute distress.  Skin: Skin is warm and dry. No rash noted.   Cardiovascular: Normal heart rate noted  Respiratory: Normal respiratory effort, no problems with respiration noted  Abdomen: Soft, gravid, appropriate for gestational age.  Pain/Pressure: Absent     Pelvic: Cervical exam deferred        Extremities: Normal range of motion.  Edema: None  Mental Status: Normal mood and affect. Normal behavior. Normal judgment and thought content.   Assessment and Plan:   Pregnancy: [id number] at [redacted]w[redacted]d 1. Encounter for supervision of high risk multigravida of advanced maternal age, antepartum Doing well overall, feeling regular and vigorous fetal movement.  2. Insulin controlled gestational diabetes mellitus (GDM) in third trimester Review of glucose levels revealed >50% of her readings were elevated, but not excessively so. Most were only over by 3-5. Discussed levels with Dr. Crissie Reese who recommended and prescribed 10U of novolin BID, but when presented to patient she declined at this time. - dietary recall showed pt has not been eating enough at meals so she gets too hungry and then overeats carbohydrates, as well as not enough protein intake. Pt determined to control her glucose levels with diet/metformin. Reinforced importance of glucose control and risks of uncontrolled GDM to she and the baby. Pt agreed to dietary changes we discussed with a MD review in one week. Will start insulin at that time if needed. - insulin NPH Human (NOVOLIN N) 100 UNIT/ML injection; Inject 0.1 mLs (10 Units total) into the skin 2 (two) times daily before a meal.  Dispense: 10 mL; Refill: 3 - Insulin Syringe-Needle U-100 (INSULIN SYRINGE .3CC/31GX5/16") 31G X 5/16" 0.3 ML MISC; 1 Syringe by Does not apply route in the morning and at bedtime.  Dispense: 100 each; Refill: 3  3. [redacted] weeks gestation of pregnancy - Routine OB care  Preterm labor symptoms and general obstetric precautions including but not limited to vaginal bleeding, contractions, leaking of fluid and fetal movement were reviewed in detail with the patient. Please refer to After Visit Summary for other counseling recommendations.   Return in about 1 week (around 03/24/2021) for IN-PERSON, HROB.  Future Appointments  Date Time Provider Department Center  03/19/2021  3:00 PM [facility] NURSE [facility]  03/19/2021  3:15 PM [facility] US2 [facility]  03/26/2021  8:20 AM [facility] LAB [facility]  03/26/2021 10:15 AM Hermina Staggers, MD [facility]  03/29/2021 11:15 AM [facility] NST [facility]  04/07/2021  3:15 PM [facility] NST [facility]  04/14/2021 10:15 AM [facility] NST [facility]  04/15/2021  2:30 PM [facility] NURSE [facility]  04/15/2021  2:45 PM [facility] US5 [facility]    Bernerd Limbo, CNM

## 2021-03-19 ENCOUNTER — Ambulatory Visit: Payer: Medicaid Other | Admitting: *Deleted

## 2021-03-19 ENCOUNTER — Ambulatory Visit: Payer: Medicaid Other | Attending: Obstetrics

## 2021-03-19 ENCOUNTER — Other Ambulatory Visit: Payer: Self-pay

## 2021-03-19 ENCOUNTER — Encounter: Payer: Self-pay | Admitting: *Deleted

## 2021-03-19 VITALS — BP 111/74 | HR 96

## 2021-03-19 DIAGNOSIS — Z3A33 33 weeks gestation of pregnancy: Secondary | ICD-10-CM | POA: Diagnosis not present

## 2021-03-19 DIAGNOSIS — D649 Anemia, unspecified: Secondary | ICD-10-CM | POA: Diagnosis not present

## 2021-03-19 DIAGNOSIS — O34219 Maternal care for unspecified type scar from previous cesarean delivery: Secondary | ICD-10-CM

## 2021-03-19 DIAGNOSIS — O09523 Supervision of elderly multigravida, third trimester: Secondary | ICD-10-CM

## 2021-03-19 DIAGNOSIS — O24415 Gestational diabetes mellitus in pregnancy, controlled by oral hypoglycemic drugs: Secondary | ICD-10-CM | POA: Diagnosis not present

## 2021-03-19 DIAGNOSIS — N90810 Female genital mutilation status, unspecified: Secondary | ICD-10-CM | POA: Diagnosis not present

## 2021-03-19 DIAGNOSIS — O99891 Other specified diseases and conditions complicating pregnancy: Secondary | ICD-10-CM | POA: Diagnosis not present

## 2021-03-19 DIAGNOSIS — O321XX0 Maternal care for breech presentation, not applicable or unspecified: Secondary | ICD-10-CM

## 2021-03-19 DIAGNOSIS — O99013 Anemia complicating pregnancy, third trimester: Secondary | ICD-10-CM | POA: Diagnosis not present

## 2021-03-22 ENCOUNTER — Other Ambulatory Visit: Payer: Self-pay | Admitting: *Deleted

## 2021-03-22 DIAGNOSIS — O24419 Gestational diabetes mellitus in pregnancy, unspecified control: Secondary | ICD-10-CM

## 2021-03-26 ENCOUNTER — Encounter: Payer: Self-pay | Admitting: Obstetrics and Gynecology

## 2021-03-26 ENCOUNTER — Other Ambulatory Visit: Payer: 59

## 2021-03-26 ENCOUNTER — Other Ambulatory Visit: Payer: Self-pay

## 2021-03-26 ENCOUNTER — Ambulatory Visit (INDEPENDENT_AMBULATORY_CARE_PROVIDER_SITE_OTHER): Payer: Medicaid Other | Admitting: Obstetrics and Gynecology

## 2021-03-26 VITALS — BP 104/67 | HR 85 | Wt 177.8 lb

## 2021-03-26 DIAGNOSIS — O09529 Supervision of elderly multigravida, unspecified trimester: Secondary | ICD-10-CM

## 2021-03-26 DIAGNOSIS — O34219 Maternal care for unspecified type scar from previous cesarean delivery: Secondary | ICD-10-CM

## 2021-03-26 DIAGNOSIS — Z2839 Other underimmunization status: Secondary | ICD-10-CM

## 2021-03-26 DIAGNOSIS — O24415 Gestational diabetes mellitus in pregnancy, controlled by oral hypoglycemic drugs: Secondary | ICD-10-CM

## 2021-03-26 DIAGNOSIS — Z789 Other specified health status: Secondary | ICD-10-CM

## 2021-03-26 NOTE — Progress Notes (Signed)
Subjective:  [name] is a 35 y.o. [phone] at [redacted]w[redacted]d being seen today for ongoing prenatal care.  She is currently monitored for the following issues for this high-risk pregnancy and has Immigrant with language difficulty; Language barrier; Anemia affecting pregnancy; Previous cesarean delivery, antepartum condition or complication; Seasonal allergies; Female genital mutilation with clitorectomy; Hypopigmentation; Prediabetes; Vision disturbance; Encounter for supervision of high risk multigravida of advanced maternal age, antepartum; Rubella non-immune status, antepartum; and Gestational diabetes on their problem list.  Patient reports no complaints.  Contractions: Irritability. Vag. Bleeding: None.  Movement: Present. Denies leaking of fluid.   The following portions of the patient's history were reviewed and updated as appropriate: allergies, current medications, past family history, past medical history, past social history, past surgical history and problem list. Problem list updated.  Objective:   Vitals:   03/26/21 1040  BP: 104/67  Pulse: 85  Weight: 177 lb 12.8 oz (80.6 kg)    Fetal Status: Fetal Heart Rate (bpm): 150   Movement: Present     General:  Alert, oriented and cooperative. Patient is in no acute distress.  Skin: Skin is warm and dry. No rash noted.   Cardiovascular: Normal heart rate noted  Respiratory: Normal respiratory effort, no problems with respiration noted  Abdomen: Soft, gravid, appropriate for gestational age. Pain/Pressure: Present     Pelvic:  Cervical exam deferred        Extremities: Normal range of motion.  Edema: Trace  Mental Status: Normal mood and affect. Normal behavior. Normal judgment and thought content.   Urinalysis:      Assessment and Plan:  Pregnancy: [id number] at [redacted]w[redacted]d  1. Encounter for supervision of high risk multigravida of advanced maternal age, antepartum Stable  2. Gestational diabetes mellitus (GDM) in third trimester  controlled on oral hypoglycemic drug CBG's improved from last visit Pt is not consistent taking Metformin or eating at consistent times. Importance of taking medication and consistent meal times reviewed Pt instructed to take Metformin 1000 mg po with breakfast and evening meals. Continue with weekly antenatal testing. Growth 62 % on 03/19/21  3. Previous cesarean delivery, antepartum condition or complication For repeat on 7/28  4. Language barrier Live interrupter used during today's visit  5. Rubella non-immune status, antepartum Vaccine PP  Preterm labor symptoms and general obstetric precautions including but not limited to vaginal bleeding, contractions, leaking of fluid and fetal movement were reviewed in detail with the patient. Please refer to After Visit Summary for other counseling recommendations.  Return in about 1 week (around 04/02/2021) for OB visit, face to face, MD only.   Hermina Staggers, MD

## 2021-03-26 NOTE — Patient Instructions (Signed)

## 2021-03-29 ENCOUNTER — Ambulatory Visit (INDEPENDENT_AMBULATORY_CARE_PROVIDER_SITE_OTHER): Payer: Medicaid Other

## 2021-03-29 ENCOUNTER — Ambulatory Visit: Payer: Medicaid Other | Admitting: *Deleted

## 2021-03-29 VITALS — BP 102/68 | HR 84 | Wt 178.7 lb

## 2021-03-29 DIAGNOSIS — O24415 Gestational diabetes mellitus in pregnancy, controlled by oral hypoglycemic drugs: Secondary | ICD-10-CM | POA: Diagnosis not present

## 2021-03-29 DIAGNOSIS — Z3A34 34 weeks gestation of pregnancy: Secondary | ICD-10-CM

## 2021-03-29 NOTE — Progress Notes (Signed)

## 2021-03-30 NOTE — Progress Notes (Signed)
Patient seen and assessed by nursing staff.  Agree with documentation and plan.  NST:  Baseline: 150 bpm, Variability: Good {> 6 bpm), Accelerations: Reactive, and Decelerations: Absent   

## 2021-04-02 ENCOUNTER — Ambulatory Visit (INDEPENDENT_AMBULATORY_CARE_PROVIDER_SITE_OTHER): Payer: Medicaid Other | Admitting: Obstetrics & Gynecology

## 2021-04-02 ENCOUNTER — Other Ambulatory Visit: Payer: Self-pay

## 2021-04-02 VITALS — BP 110/78 | HR 100 | Wt 176.0 lb

## 2021-04-02 DIAGNOSIS — Z3A35 35 weeks gestation of pregnancy: Secondary | ICD-10-CM

## 2021-04-02 DIAGNOSIS — Z419 Encounter for procedure for purposes other than remedying health state, unspecified: Secondary | ICD-10-CM | POA: Diagnosis not present

## 2021-04-02 DIAGNOSIS — O09529 Supervision of elderly multigravida, unspecified trimester: Secondary | ICD-10-CM

## 2021-04-02 DIAGNOSIS — O24414 Gestational diabetes mellitus in pregnancy, insulin controlled: Secondary | ICD-10-CM

## 2021-04-02 DIAGNOSIS — O34219 Maternal care for unspecified type scar from previous cesarean delivery: Secondary | ICD-10-CM

## 2021-04-02 MED ORDER — INSULIN NPH (HUMAN) (ISOPHANE) 100 UNIT/ML ~~LOC~~ SUSP: 10.0000 [IU] | Freq: Every day | SUBCUTANEOUS | 3 refills | Status: DC

## 2021-04-02 NOTE — Progress Notes (Signed)
   PRENATAL VISIT NOTE  Subjective:  [name] is a 35 y.o. [phone] at [redacted]w[redacted]d being seen today for ongoing prenatal care. Arabic in person interpreter present. She is currently monitored for the following issues for this high-risk pregnancy and has Immigrant with language difficulty; Language barrier; Anemia affecting pregnancy; Previous cesarean delivery, antepartum condition or complication; Seasonal allergies; Female genital mutilation with clitorectomy; Hypopigmentation; Prediabetes; Vision disturbance; Encounter for supervision of high risk multigravida of advanced maternal age, antepartum; Rubella non-immune status, antepartum; and Gestational diabetes on their problem list.  Patient reports no complaints.  Contractions: Irritability. Vag. Bleeding: None.  Movement: Present. Denies leaking of fluid.   The following portions of the patient's history were reviewed and updated as appropriate: allergies, current medications, past family history, past medical history, past social history, past surgical history and problem list.   Objective:   Vitals:   04/02/21 0932  BP: 110/78  Pulse: 100  Weight: 176 lb (79.8 kg)    Fetal Status: Fetal Heart Rate (bpm): 152   Movement: Present     General:  Alert, oriented and cooperative. Patient is in no acute distress.  Skin: Skin is warm and dry. No rash noted.   Cardiovascular: Normal heart rate noted  Respiratory: Normal respiratory effort, no problems with respiration noted  Abdomen: Soft, gravid, appropriate for gestational age.  Pain/Pressure: Present     Pelvic: Cervical exam deferred        Extremities: Normal range of motion.  Edema: Trace  Mental Status: Normal mood and affect. Normal behavior. Normal judgment and thought content.   Assessment and Plan:  Pregnancy: [id number] at 108w1d 1. Insulin controlled gestational diabetes mellitus (GDM) in third trimester More than half of blood sugars are >95.  Advised to start insulin as  recommended,modified to NPH 10 units qhs. Reevaluate next week. Continue Metformin 1000 mg po bid too.  Continue antenatal screening and scans as recommended. - insulin NPH Human (NOVOLIN N) 100 UNIT/ML injection; Inject 0.1 mLs (10 Units total) into the skin at bedtime.  Dispense: 10 mL; Refill: 3  2. Previous cesarean delivery, antepartum condition or complication Scheduled for RCS at 35 weeks.   3. [redacted] weeks gestation of pregnancy 4. Encounter for supervision of high risk multigravida of advanced maternal age, antepartum Preterm labor symptoms and general obstetric precautions including but not limited to vaginal bleeding, contractions, leaking of fluid and fetal movement were reviewed in detail with the patient. Please refer to After Visit Summary for other counseling recommendations.   Return in about 1 week (around 04/09/2021) for Weekly HR OB Visit, Pelvic Cultures, NST+BPP here at [facility].  Future Appointments  Date Time Provider Department Center  04/07/2021  3:15 PM [facility] NST [facility]  04/15/2021  2:30 PM [facility] NURSE [facility]  04/15/2021  2:45 PM [facility] US5 [facility]  04/15/2021  3:55 PM Constant, Peggy, MD [facility]  04/22/2021 10:35 AM Constant, Gigi Gin, MD [facility]  04/22/2021 11:15 AM [facility] NST [facility]    Jaynie Collins, MD

## 2021-04-02 NOTE — Patient Instructions (Signed)
Return to office for any scheduled appointments. Call the office or go to the MAU at [facility] if:  You begin to have strong, frequent contractions  Your water breaks.  Sometimes it is a big gush of fluid, sometimes it is just a trickle that keeps getting your panties wet or running down your legs  You have vaginal bleeding.  It is normal to have a small amount of spotting if your cervix was checked.   You do not feel your baby moving like normal.  If you do not, get something to eat and drink and lay down and focus on feeling your baby move.   If your baby is still not moving like normal, you should call the office or go to MAU.  Any other obstetric concerns.   

## 2021-04-03 IMAGING — US US MFM OB DETAIL+14 WK
1 series · 13 of 28 positions shown · non-contrast
Comparison: none

[Series 1: us mfm ob detail+14 wk · 135 acquisitions, 13 frames shown]
[im 5/135]
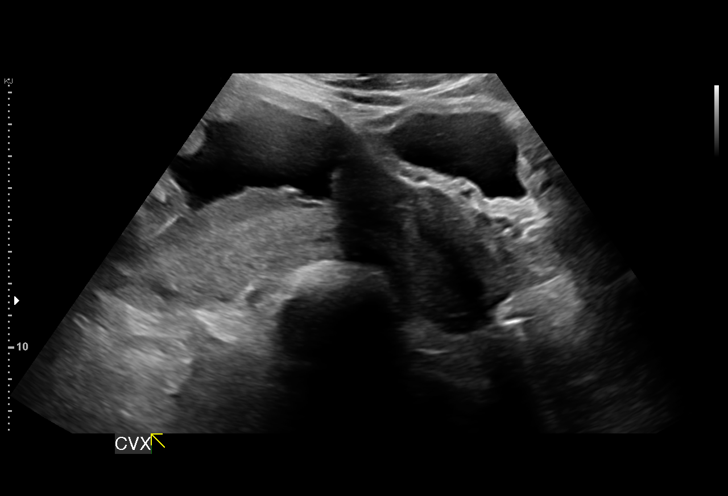
[im 15/135]
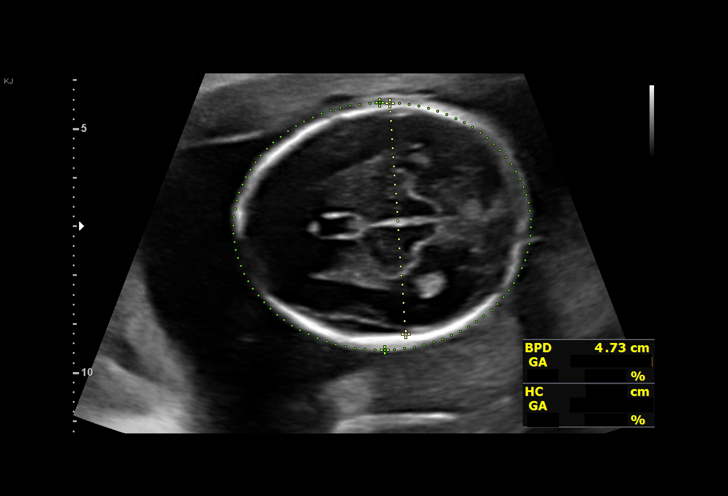
[im 25/135]
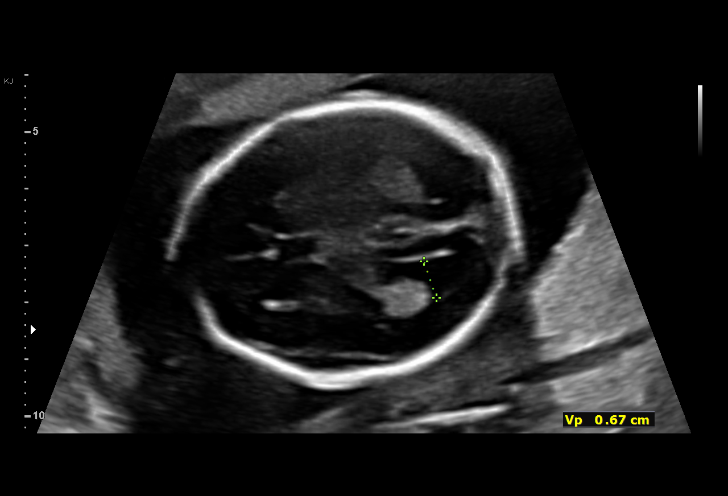
[im 35/135]
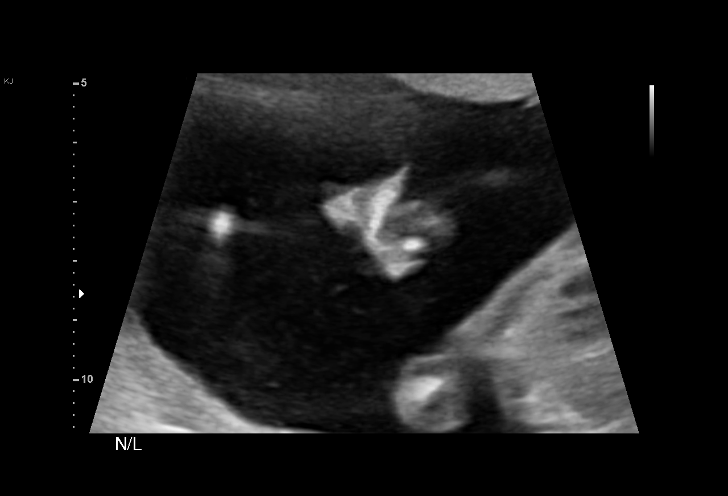
[im 45/135]
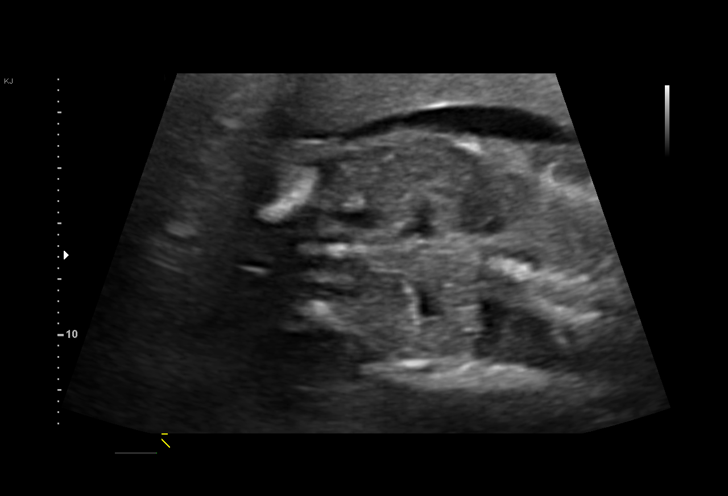
[im 55/135]
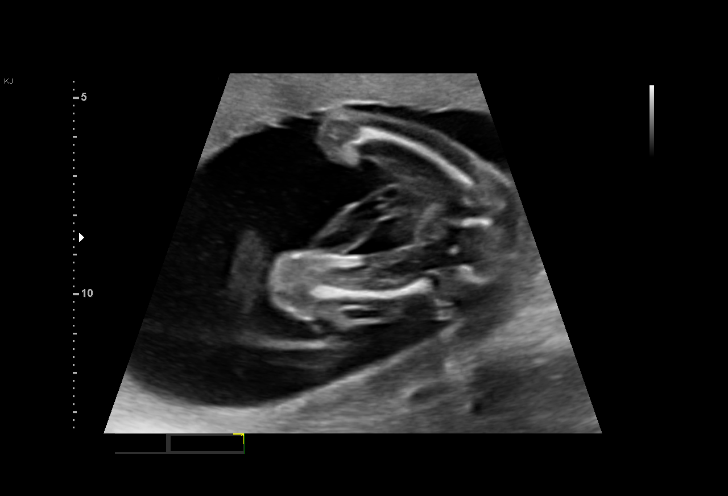
[im 70/135]
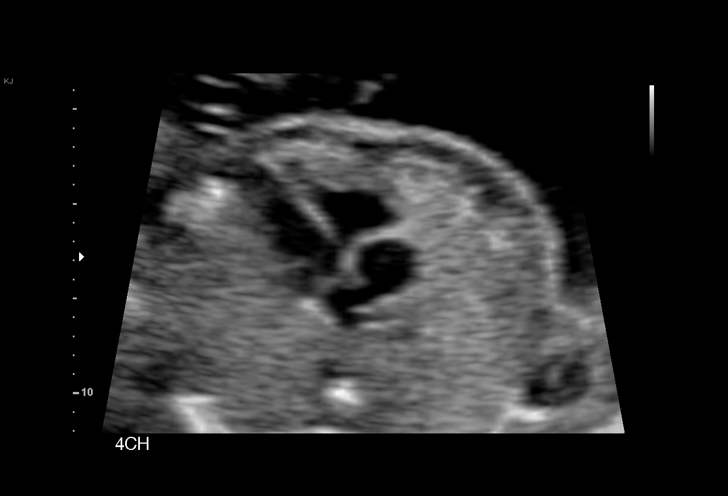
[im 80/135]
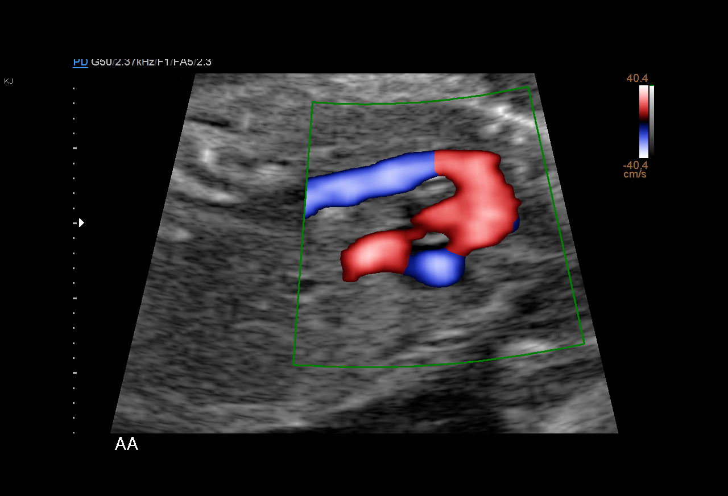
[im 90/135]
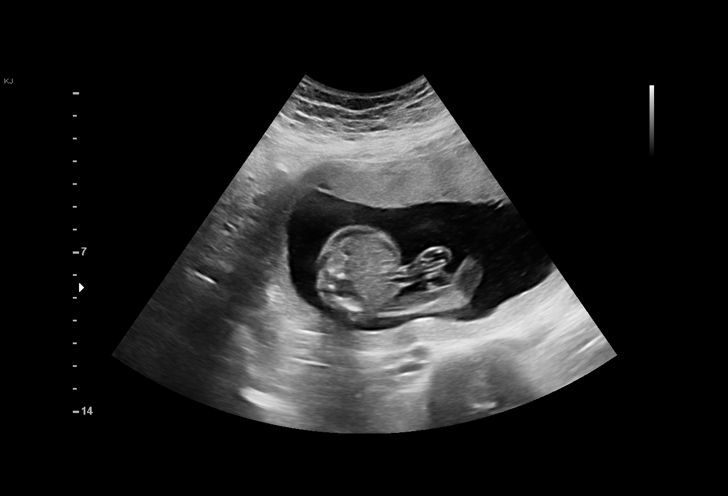
[im 100/135]
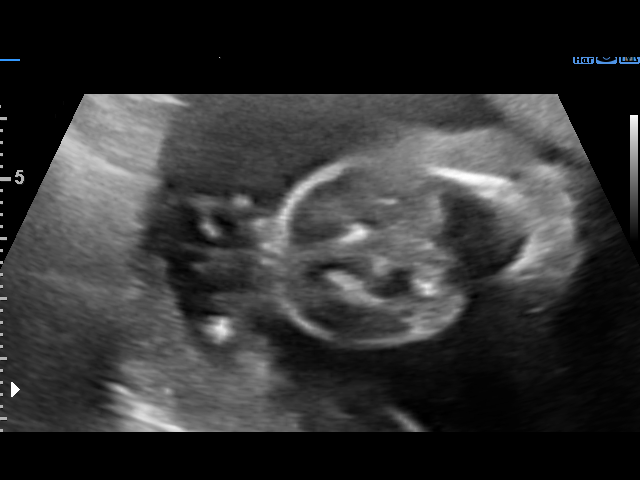
[im 110/135]
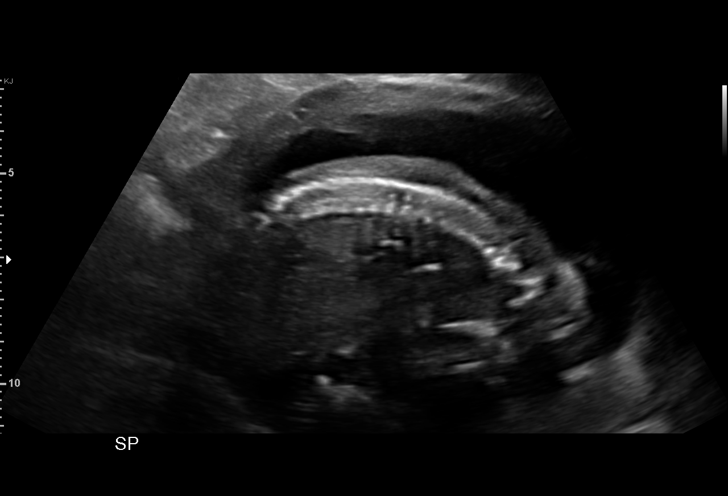
[im 120/135]
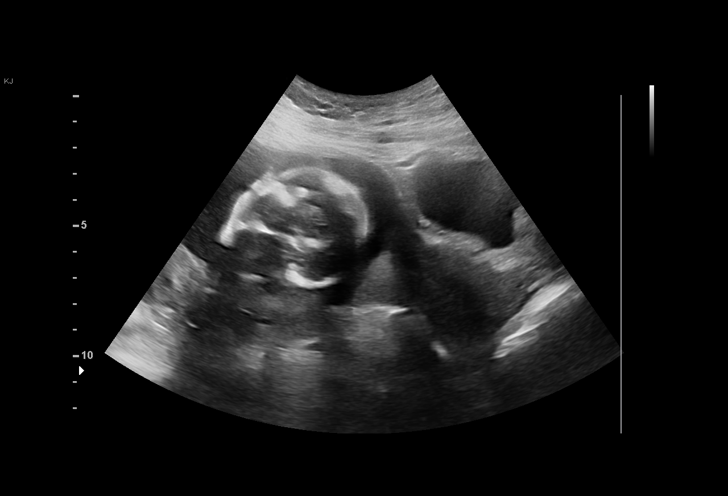
[im 130/135]
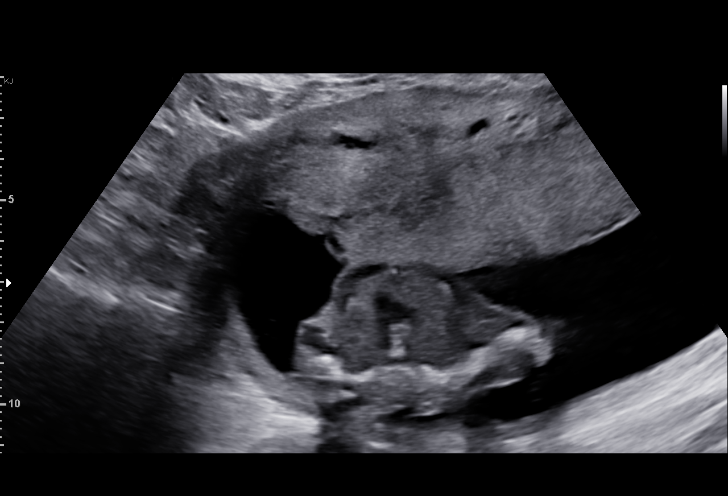

[13 of 28 positions shown; findings below may reference images not displayed]

street
                   MAKIA KIEL

Indications

 Advanced maternal age multigravida 35+,
 second trimester
 Previous cesarean delivery, antepartum x 3
 Gestational diabetes in pregnancy, diet
 controlled
 Poor obstetric history: Previous gestational
 diabetes
 Medical complication of pregnancy (Female
 Circumcision)
 Anemia during pregnancy in second trimester
 LR NIPS
 19 weeks gestation of pregnancy
Fetal Evaluation

 Num Of Fetuses:         1
 Fetal Heart Rate(bpm):  158
 Cardiac Activity:       Observed
 Presentation:           Cephalic
 Placenta:               Anterior
 P. Cord Insertion:      Visualized, central

 Amniotic Fluid
 AFI FV:      Within normal limits
Biometry
 BPD:      47.1  mm     G. Age:  20w 2d         72  %    CI:        72.74   %    70 - 86
                                                         FL/HC:      18.8   %    16.8 -
 HC:      175.6  mm     G. Age:  20w 0d         59  %    HC/AC:      1.12        1.09 -
 AC:      156.9  mm     G. Age:  20w 6d         80  %    FL/BPD:     70.3   %
 FL:       33.1  mm     G. Age:  20w 2d         66  %    FL/AC:      21.1   %    20 - 24
 HUM:      30.9  mm     G. Age:  20w 2d         67  %
 CER:        20  mm     G. Age:  19w 2d         50  %
 NFT:       3.8  mm

 LV:        6.7  mm
 CM:        2.2  mm

 Est. FW:     361  gm    0 lb 13 oz      88  %
OB History

 Blood Type:   A+
 Gravidity:    5         Term:   3        Prem:   0        SAB:   1
 TOP:          0       Ectopic:  0        Living: 3
Gestational Age

 LMP:           19w 5d        Date:  07/30/20                 EDD:   05/06/21
 U/S Today:     20w 3d                                        EDD:   05/01/21
 Best:          19w 5d     Det. By:  LMP  (07/30/20)          EDD:   05/06/21
Anatomy

 Cranium:               Appears normal         LVOT:                   Appears normal
 Cavum:                 Appears normal         Aortic Arch:            Appears normal
 Ventricles:            Appears normal         Ductal Arch:            Appears normal
 Choroid Plexus:        Appears normal         Diaphragm:              Appears normal
 Cerebellum:            Appears normal         Stomach:                Appears normal, left
                                                                       sided
 Posterior Fossa:       Appears normal         Abdomen:                Appears normal
 Nuchal Fold:           Appears normal         Abdominal Wall:         Appears nml (cord
                                                                       insert, abd wall)
 Face:                  Appears normal         Cord Vessels:           Appears normal (3
                        (orbits and profile)                           vessel cord)
 Lips:                  Appears normal         Kidneys:                Appear normal
 Palate:                Appears normal         Bladder:                Appears normal
 Thoracic:              Appears normal         Spine:                  Appears normal
 Heart:                 Appears normal         Upper Extremities:      Appears normal
                        (4CH, axis, and
                        situs)
 RVOT:                  Appears normal         Lower Extremities:      Appears normal

 Other:  SVC IVC appears normla. 3VV/T appears normal. Heels and 5th digit
         visualized. Nasal bone visualized. Fetus appears to be a male.
Cervix Uterus Adnexa

 Cervix
 Length:           3.84  cm.
 Normal appearance by transabdominal scan.
 Adnexa
 No abnormality visualized.
Comments

 This patient was seen for a detailed fetal anatomy scan due
 to advanced maternal age.  The patient's current pregnancy
 has been complicated by early onset diet-controlled
 gestational diabetes.  She was screened early for gestational
 diabetes in her current pregnancy due to a history of
 gestational diabetes in her prior pregnancy.  Her hemoglobin
 A1c earlier in her pregnancy was 5.8%.
 She had a cell free DNA test earlier in her pregnancy which
 indicated a low risk for trisomy 21, 18, and 13. A male fetus is
 predicted.
 She was informed that the fetal growth and amniotic fluid
 level were appropriate for her gestational age.
 There were no obvious fetal anomalies noted on today's
 ultrasound exam.
 The patient was informed that anomalies may be missed due
 to technical limitations. If the fetus is in a suboptimal position
 or maternal habitus is increased, visualization of the fetus in
 the maternal uterus may be impaired.
 The increased risk of fetal aneuploidy due to advanced
 maternal age was discussed. Due to advanced maternal age,
 the patient was offered and declined an amniocentesis today
 for definitive diagnosis of fetal aneuploidy.
 Due to early onset gestational diabetes, she was referred for
 a fetal echocardiogram with [HOSPITAL] pediatric cardiology.
 A follow-up exam was scheduled in 4 weeks.

## 2021-04-07 ENCOUNTER — Ambulatory Visit: Payer: Medicaid Other | Admitting: *Deleted

## 2021-04-07 ENCOUNTER — Other Ambulatory Visit: Payer: Self-pay

## 2021-04-07 ENCOUNTER — Ambulatory Visit (INDEPENDENT_AMBULATORY_CARE_PROVIDER_SITE_OTHER): Payer: Medicaid Other

## 2021-04-07 VITALS — BP 106/80 | HR 87 | Wt 178.3 lb

## 2021-04-07 DIAGNOSIS — Z3A35 35 weeks gestation of pregnancy: Secondary | ICD-10-CM

## 2021-04-07 DIAGNOSIS — O24414 Gestational diabetes mellitus in pregnancy, insulin controlled: Secondary | ICD-10-CM

## 2021-04-07 NOTE — Progress Notes (Signed)

## 2021-04-14 ENCOUNTER — Other Ambulatory Visit: Payer: 59

## 2021-04-15 ENCOUNTER — Ambulatory Visit: Payer: Medicaid Other | Attending: Maternal & Fetal Medicine

## 2021-04-15 ENCOUNTER — Other Ambulatory Visit (HOSPITAL_COMMUNITY)
Admission: RE | Admit: 2021-04-15 | Discharge: 2021-04-15 | Disposition: A | Discharge status: Home / Self Care | Payer: Medicaid Other | Source: Ambulatory Visit | Attending: Obstetrics and Gynecology | Admitting: Obstetrics and Gynecology

## 2021-04-15 ENCOUNTER — Other Ambulatory Visit: Payer: Self-pay

## 2021-04-15 ENCOUNTER — Ambulatory Visit: Payer: Medicaid Other | Admitting: *Deleted

## 2021-04-15 ENCOUNTER — Encounter: Payer: Self-pay | Admitting: Obstetrics and Gynecology

## 2021-04-15 ENCOUNTER — Ambulatory Visit (INDEPENDENT_AMBULATORY_CARE_PROVIDER_SITE_OTHER): Payer: Medicaid Other | Admitting: Obstetrics and Gynecology

## 2021-04-15 ENCOUNTER — Encounter: Payer: Self-pay | Admitting: *Deleted

## 2021-04-15 VITALS — BP 109/76 | HR 94 | Wt 178.0 lb

## 2021-04-15 VITALS — BP 121/73 | HR 93

## 2021-04-15 DIAGNOSIS — O34219 Maternal care for unspecified type scar from previous cesarean delivery: Secondary | ICD-10-CM

## 2021-04-15 DIAGNOSIS — D649 Anemia, unspecified: Secondary | ICD-10-CM | POA: Diagnosis not present

## 2021-04-15 DIAGNOSIS — O24414 Gestational diabetes mellitus in pregnancy, insulin controlled: Secondary | ICD-10-CM

## 2021-04-15 DIAGNOSIS — O99891 Other specified diseases and conditions complicating pregnancy: Secondary | ICD-10-CM

## 2021-04-15 DIAGNOSIS — O09523 Supervision of elderly multigravida, third trimester: Secondary | ICD-10-CM | POA: Diagnosis not present

## 2021-04-15 DIAGNOSIS — N90810 Female genital mutilation status, unspecified: Secondary | ICD-10-CM | POA: Diagnosis not present

## 2021-04-15 DIAGNOSIS — O09529 Supervision of elderly multigravida, unspecified trimester: Secondary | ICD-10-CM

## 2021-04-15 DIAGNOSIS — Z362 Encounter for other antenatal screening follow-up: Secondary | ICD-10-CM | POA: Diagnosis not present

## 2021-04-15 DIAGNOSIS — O24415 Gestational diabetes mellitus in pregnancy, controlled by oral hypoglycemic drugs: Secondary | ICD-10-CM

## 2021-04-15 DIAGNOSIS — O99013 Anemia complicating pregnancy, third trimester: Secondary | ICD-10-CM

## 2021-04-15 DIAGNOSIS — Z2839 Other underimmunization status: Secondary | ICD-10-CM

## 2021-04-15 DIAGNOSIS — O24419 Gestational diabetes mellitus in pregnancy, unspecified control: Secondary | ICD-10-CM

## 2021-04-15 DIAGNOSIS — O09899 Supervision of other high risk pregnancies, unspecified trimester: Secondary | ICD-10-CM

## 2021-04-15 DIAGNOSIS — Z789 Other specified health status: Secondary | ICD-10-CM

## 2021-04-15 DIAGNOSIS — Z3A37 37 weeks gestation of pregnancy: Secondary | ICD-10-CM

## 2021-04-15 LAB — POCT URINALYSIS DIP (DEVICE)
Glucose, UA: NEGATIVE mg/dL
Hgb urine dipstick: NEGATIVE
Ketones, ur: NEGATIVE mg/dL
Leukocytes,Ua: NEGATIVE
Nitrite: NEGATIVE
Protein, ur: NEGATIVE mg/dL
Specific Gravity, Urine: >=1.03 (ref 1.005–1.030)
Urobilinogen, UA: 1 mg/dL (ref 0.0–1.0)
pH: 5.5 (ref 5.0–8.0)

## 2021-04-15 NOTE — Progress Notes (Signed)
   PRENATAL VISIT NOTE  Subjective:  [name] is a 35 y.o. [phone] at [redacted]w[redacted]d being seen today for ongoing prenatal care.  She is currently monitored for the following issues for this high-risk pregnancy and has Immigrant with language difficulty; Language barrier; Anemia affecting pregnancy; Previous cesarean delivery, antepartum condition or complication; Seasonal allergies; Female genital mutilation with clitorectomy; Hypopigmentation; Prediabetes; Vision disturbance; Encounter for supervision of high risk multigravida of advanced maternal age, antepartum; Rubella non-immune status, antepartum; and Gestational diabetes on their problem list.  Patient reports no complaints.  Contractions: Irritability. Vag. Bleeding: None.  Movement: Present. Denies leaking of fluid.   The following portions of the patient's history were reviewed and updated as appropriate: allergies, current medications, past family history, past medical history, past social history, past surgical history and problem list.   Objective:   Vitals:   04/15/21 1611  BP: 109/76  Pulse: 94  Weight: 178 lb (80.7 kg)    Fetal Status: Fetal Heart Rate (bpm): 157 Fundal Height: 37 cm Movement: Present     General:  Alert, oriented and cooperative. Patient is in no acute distress.  Skin: Skin is warm and dry. No rash noted.   Cardiovascular: Normal heart rate noted  Respiratory: Normal respiratory effort, no problems with respiration noted  Abdomen: Soft, gravid, appropriate for gestational age.  Pain/Pressure: Absent     Pelvic: Cervical exam deferred        Extremities: Normal range of motion.  Edema: Trace  Mental Status: Normal mood and affect. Normal behavior. Normal judgment and thought content.   Assessment and Plan:  Pregnancy: [id number] at [redacted]w[redacted]d 1. Encounter for supervision of high risk multigravida of advanced maternal age, antepartum Patient is doing well without complaints Cultures today  2. Gestational  diabetes mellitus (GDM) in third trimester controlled on oral hypoglycemic drug CBGs reviewed and all values are within range since starting insulin Continue current regimen Patient had a growth ultrasound and BPP prior to her appointment- report not yet available Continue weekly antenatal testing until delivery  3. Previous cesarean delivery, antepartum condition or complication Patient scheduled for repeat c-section with IUD placement  4. Anemia affecting pregnancy in third trimester Continue iron supplement  5. Language barrier Arabic interpreter present   6. Rubella non-immune status, antepartum Will offer pp  Term labor symptoms and general obstetric precautions including but not limited to vaginal bleeding, contractions, leaking of fluid and fetal movement were reviewed in detail with the patient. Please refer to After Visit Summary for other counseling recommendations.   Return in about 1 week (around 04/22/2021) for in person, ROB, High risk.  Future Appointments  Date Time Provider Department Center  04/22/2021 10:35 AM Phillips Goulette, Gigi Gin, MD [facility]  04/22/2021 11:15 AM [facility] NST [facility]  04/27/2021  9:30 AM [facility]-LD PAT 1 [facility]IN[facility] None  04/27/2021 10:25 AM MC-SCREENING [facility]-SDSC None    Catalina Antigua, MD

## 2021-04-16 LAB — GC/CHLAMYDIA PROBE AMP (~~LOC~~) NOT AT ARMC
Chlamydia: NEGATIVE
Comment: NEGATIVE
Comment: NORMAL
Neisseria Gonorrhea: NEGATIVE

## 2021-04-19 LAB — CULTURE, BETA STREP (GROUP B ONLY): Strep Gp B Culture: NEGATIVE

## 2021-04-20 ENCOUNTER — Telehealth (HOSPITAL_COMMUNITY): Payer: Self-pay | Admitting: *Deleted

## 2021-04-20 NOTE — Telephone Encounter (Signed)
Preadmission screen  

## 2021-04-20 NOTE — Pre-Procedure Instructions (Signed)
Interpreter number [phone]

## 2021-04-21 ENCOUNTER — Telehealth (HOSPITAL_COMMUNITY): Payer: Self-pay | Admitting: *Deleted

## 2021-04-21 NOTE — Telephone Encounter (Signed)
Preadmission screen  

## 2021-04-21 NOTE — Pre-Procedure Instructions (Signed)
Interpreter number [phone] 

## 2021-04-22 ENCOUNTER — Other Ambulatory Visit: Payer: Self-pay

## 2021-04-22 ENCOUNTER — Ambulatory Visit (INDEPENDENT_AMBULATORY_CARE_PROVIDER_SITE_OTHER): Payer: Medicaid Other

## 2021-04-22 ENCOUNTER — Encounter: Payer: Self-pay | Admitting: Obstetrics and Gynecology

## 2021-04-22 ENCOUNTER — Encounter: Payer: Self-pay | Admitting: *Deleted

## 2021-04-22 ENCOUNTER — Telehealth (HOSPITAL_COMMUNITY): Payer: Self-pay | Admitting: *Deleted

## 2021-04-22 ENCOUNTER — Ambulatory Visit: Payer: Medicaid Other

## 2021-04-22 ENCOUNTER — Ambulatory Visit (INDEPENDENT_AMBULATORY_CARE_PROVIDER_SITE_OTHER): Payer: Medicaid Other | Admitting: Obstetrics and Gynecology

## 2021-04-22 ENCOUNTER — Ambulatory Visit: Payer: Medicaid Other | Admitting: *Deleted

## 2021-04-22 VITALS — BP 116/83 | HR 94 | Wt 180.0 lb

## 2021-04-22 DIAGNOSIS — O34219 Maternal care for unspecified type scar from previous cesarean delivery: Secondary | ICD-10-CM

## 2021-04-22 DIAGNOSIS — O09529 Supervision of elderly multigravida, unspecified trimester: Secondary | ICD-10-CM

## 2021-04-22 DIAGNOSIS — O24414 Gestational diabetes mellitus in pregnancy, insulin controlled: Secondary | ICD-10-CM | POA: Diagnosis not present

## 2021-04-22 DIAGNOSIS — O24415 Gestational diabetes mellitus in pregnancy, controlled by oral hypoglycemic drugs: Secondary | ICD-10-CM

## 2021-04-22 DIAGNOSIS — O99013 Anemia complicating pregnancy, third trimester: Secondary | ICD-10-CM

## 2021-04-22 NOTE — Pre-Procedure Instructions (Signed)
Interpreter number [phone] 

## 2021-04-22 NOTE — Progress Notes (Signed)
   PRENATAL VISIT NOTE  Subjective:  [name] is a 35 y.o. [phone] at [redacted]w[redacted]d being seen today for ongoing prenatal care.  She is currently monitored for the following issues for this high-risk pregnancy and has Immigrant with language difficulty; Language barrier; Anemia affecting pregnancy; Previous cesarean delivery, antepartum condition or complication; Seasonal allergies; Female genital mutilation with clitorectomy; Hypopigmentation; Prediabetes; Vision disturbance; Encounter for supervision of high risk multigravida of advanced maternal age, antepartum; Rubella non-immune status, antepartum; and Gestational diabetes on their problem list.  Patient reports no complaints.  Contractions: Not present. Vag. Bleeding: None.  Movement: Present. Denies leaking of fluid.   The following portions of the patient's history were reviewed and updated as appropriate: allergies, current medications, past family history, past medical history, past social history, past surgical history and problem list.   Objective:   Vitals:   04/22/21 1036  BP: 116/83  Pulse: 94  Weight: 180 lb (81.6 kg)    Fetal Status: Fetal Heart Rate (bpm): 145   Movement: Present     General:  Alert, oriented and cooperative. Patient is in no acute distress.  Skin: Skin is warm and dry. No rash noted.   Cardiovascular: Normal heart rate noted  Respiratory: Normal respiratory effort, no problems with respiration noted  Abdomen: Soft, gravid, appropriate for gestational age.  Pain/Pressure: Absent     Pelvic: Cervical exam deferred        Extremities: Normal range of motion.  Edema: None  Mental Status: Normal mood and affect. Normal behavior. Normal judgment and thought content.   Assessment and Plan:  Pregnancy: [id number] at [redacted]w[redacted]d 1. Encounter for supervision of high risk multigravida of advanced maternal age, antepartum Patient is doing well without complaints  2. Gestational diabetes mellitus (GDM) in third trimester  controlled on oral hypoglycemic drug Patient did not bring log book and reports fasting this morning 94 and highest pp 124 with most values between 115-118 NST scheduled later this morning  3. Previous cesarean delivery, antepartum condition or complication Scheduled at 39 weeks   4. Anemia affecting pregnancy in third trimester Continue supplement  Term labor symptoms and general obstetric precautions including but not limited to vaginal bleeding, contractions, leaking of fluid and fetal movement were reviewed in detail with the patient. Please refer to After Visit Summary for other counseling recommendations.   Return in about 6 weeks (around 06/03/2021) for postpartum.  Future Appointments  Date Time Provider Department Center  04/22/2021 11:15 AM Day, Drucilla Schmidt RN [facility]  04/27/2021  9:30 AM [facility]-LD PAT 1 [facility]-INDC None  04/27/2021 10:25 AM MC-SCREENING MC-SDSC None    Catalina Antigua, MD

## 2021-04-22 NOTE — Telephone Encounter (Signed)
Preadmission screen  

## 2021-04-23 ENCOUNTER — Encounter (HOSPITAL_COMMUNITY): Payer: Self-pay

## 2021-04-23 NOTE — Pre-Procedure Instructions (Signed)
Interpreter number [phone]

## 2021-04-23 NOTE — Patient Instructions (Signed)
[name]  04/23/2021   Your procedure is scheduled on:  04/29/2021  Arrive at 0730 at Entrance C on [address] at [facility]  and [employer]. You are invited to use the FREE valet parking or use the Visitor's parking deck.  Pick up the phone at the desk and dial [phone].  Call this number if you have problems the morning of surgery: [phone]  Remember:   Do not eat food:(After Midnight) Desps de medianoche.  Do not drink clear liquids: (After Midnight) Desps de medianoche.  Take these medicines the morning of surgery with A SIP OF WATER:  Take 5 units of insulin the night before surgery.  No medicine on day of surgery   Do not wear jewelry, make-up or nail polish.  Do not wear lotions, powders, or perfumes. Do not wear deodorant.  Do not shave 48 hours prior to surgery.  Do not bring valuables to the hospital.  [facility] is not   responsible for any belongings or valuables brought to the hospital.  Contacts, dentures or bridgework may not be worn into surgery.  Leave suitcase in the car. After surgery it may be brought to your room.  For patients admitted to the hospital, checkout time is 11:00 AM the day of              discharge.      Please read over the following fact sheets that you were given:     Preparing for Surgery

## 2021-04-26 ENCOUNTER — Other Ambulatory Visit: Payer: Self-pay | Admitting: Obstetrics and Gynecology

## 2021-04-26 DIAGNOSIS — Z98891 History of uterine scar from previous surgery: Secondary | ICD-10-CM | POA: Insufficient documentation

## 2021-04-26 HISTORY — DX: History of uterine scar from previous surgery: Z98.891

## 2021-04-26 MED ORDER — SOD CITRATE-CITRIC ACID 500-334 MG/5ML PO SOLN: 30.0000 mL | ORAL | Status: DC

## 2021-04-26 MED ORDER — CEFAZOLIN SODIUM-DEXTROSE 2-4 GM/100ML-% IV SOLN: 2.0000 g | INTRAVENOUS | Status: DC

## 2021-04-26 NOTE — Progress Notes (Signed)
Pre-op orders placed

## 2021-04-27 ENCOUNTER — Other Ambulatory Visit (HOSPITAL_COMMUNITY)
Admission: RE | Admit: 2021-04-27 | Discharge: 2021-04-27 | Disposition: A | Discharge status: Home / Self Care | Payer: Medicaid Other | Source: Ambulatory Visit | Attending: Obstetrics and Gynecology | Admitting: Obstetrics and Gynecology

## 2021-04-27 ENCOUNTER — Encounter (HOSPITAL_COMMUNITY)
Admission: RE | Admit: 2021-04-27 | Discharge: 2021-04-27 | Disposition: A | Discharge status: Home / Self Care | Payer: Medicaid Other | Source: Ambulatory Visit | Attending: Obstetrics and Gynecology | Admitting: Obstetrics and Gynecology

## 2021-04-27 ENCOUNTER — Other Ambulatory Visit: Payer: Self-pay | Admitting: Family Medicine

## 2021-04-27 ENCOUNTER — Other Ambulatory Visit: Payer: Self-pay

## 2021-04-27 DIAGNOSIS — Z20822 Contact with and (suspected) exposure to covid-19: Secondary | ICD-10-CM | POA: Diagnosis not present

## 2021-04-27 DIAGNOSIS — Z01812 Encounter for preprocedural laboratory examination: Secondary | ICD-10-CM | POA: Insufficient documentation

## 2021-04-27 LAB — TYPE AND SCREEN
ABO/RH(D): A POS
Antibody Screen: NEGATIVE

## 2021-04-27 LAB — CBC
HCT: 33.5 % — ABNORMAL LOW (ref 36.0–46.0)
Hemoglobin: 10.6 g/dL — ABNORMAL LOW (ref 12.0–15.0)
MCH: 23.8 pg — ABNORMAL LOW (ref 26.0–34.0)
MCHC: 31.6 g/dL (ref 30.0–36.0)
MCV: 75.1 fL — ABNORMAL LOW (ref 80.0–100.0)
Platelets: 282 10*3/uL (ref 150–400)
RBC: 4.46 MIL/uL (ref 3.87–5.11)
RDW: 15.9 % — ABNORMAL HIGH (ref 11.5–15.5)
WBC: 5.2 10*3/uL (ref 4.0–10.5)
nRBC: 0 % (ref 0.0–0.2)

## 2021-04-27 LAB — SARS CORONAVIRUS 2 (TAT 6-24 HRS): SARS Coronavirus 2: NEGATIVE

## 2021-04-27 MED ORDER — LEVONORGESTREL 20.1 MCG/DAY IU IUD: 1.0000 | INTRAUTERINE_SYSTEM | Freq: Once | INTRAUTERINE | Status: DC

## 2021-04-28 ENCOUNTER — Encounter (HOSPITAL_COMMUNITY): Payer: Self-pay | Admitting: Obstetrics and Gynecology

## 2021-04-28 LAB — RPR: RPR Ser Ql: NONREACTIVE

## 2021-04-29 ENCOUNTER — Inpatient Hospital Stay (HOSPITAL_COMMUNITY): Payer: Medicaid Other | Admitting: Anesthesiology

## 2021-04-29 ENCOUNTER — Inpatient Hospital Stay (HOSPITAL_COMMUNITY)
Admission: RE | Admit: 2021-04-29 | Discharge: 2021-05-01 | DRG: 788 | Disposition: A | Discharge status: Home / Self Care | Payer: Medicaid Other | Attending: Obstetrics and Gynecology | Admitting: Obstetrics and Gynecology

## 2021-04-29 ENCOUNTER — Encounter (HOSPITAL_COMMUNITY)
Admission: RE | Disposition: A | Discharge status: Home / Self Care | Payer: Self-pay | Source: Home / Self Care | Attending: Obstetrics and Gynecology

## 2021-04-29 ENCOUNTER — Other Ambulatory Visit: Payer: Self-pay

## 2021-04-29 ENCOUNTER — Encounter (HOSPITAL_COMMUNITY): Payer: Self-pay | Admitting: Obstetrics and Gynecology

## 2021-04-29 DIAGNOSIS — Z23 Encounter for immunization: Secondary | ICD-10-CM

## 2021-04-29 DIAGNOSIS — O9902 Anemia complicating childbirth: Secondary | ICD-10-CM | POA: Diagnosis present

## 2021-04-29 DIAGNOSIS — Z3A39 39 weeks gestation of pregnancy: Secondary | ICD-10-CM

## 2021-04-29 DIAGNOSIS — Z8632 Personal history of gestational diabetes: Secondary | ICD-10-CM | POA: Diagnosis present

## 2021-04-29 DIAGNOSIS — O34211 Maternal care for low transverse scar from previous cesarean delivery: Principal | ICD-10-CM | POA: Diagnosis present

## 2021-04-29 DIAGNOSIS — Z98891 History of uterine scar from previous surgery: Secondary | ICD-10-CM

## 2021-04-29 DIAGNOSIS — O24419 Gestational diabetes mellitus in pregnancy, unspecified control: Secondary | ICD-10-CM | POA: Diagnosis present

## 2021-04-29 DIAGNOSIS — Z758 Other problems related to medical facilities and other health care: Secondary | ICD-10-CM | POA: Diagnosis present

## 2021-04-29 DIAGNOSIS — D649 Anemia, unspecified: Secondary | ICD-10-CM | POA: Diagnosis not present

## 2021-04-29 DIAGNOSIS — Z789 Other specified health status: Secondary | ICD-10-CM | POA: Diagnosis present

## 2021-04-29 DIAGNOSIS — O24424 Gestational diabetes mellitus in childbirth, insulin controlled: Secondary | ICD-10-CM | POA: Diagnosis present

## 2021-04-29 DIAGNOSIS — Z862 Personal history of diseases of the blood and blood-forming organs and certain disorders involving the immune mechanism: Secondary | ICD-10-CM | POA: Diagnosis present

## 2021-04-29 DIAGNOSIS — Z7982 Long term (current) use of aspirin: Secondary | ICD-10-CM | POA: Diagnosis not present

## 2021-04-29 DIAGNOSIS — O09899 Supervision of other high risk pregnancies, unspecified trimester: Secondary | ICD-10-CM

## 2021-04-29 DIAGNOSIS — O99019 Anemia complicating pregnancy, unspecified trimester: Secondary | ICD-10-CM | POA: Diagnosis present

## 2021-04-29 DIAGNOSIS — Z2839 Other underimmunization status: Secondary | ICD-10-CM

## 2021-04-29 LAB — GLUCOSE, CAPILLARY
Glucose-Capillary: 73 mg/dL (ref 70–99)
Glucose-Capillary: 78 mg/dL (ref 70–99)

## 2021-04-29 SURGERY — Surgical Case
Anesthesia: Spinal | Wound class: Clean Contaminated

## 2021-04-29 MED ORDER — ACETAMINOPHEN 160 MG/5ML PO SOLN: 1000.0000 mg | Freq: Once | ORAL | Status: DC | PRN

## 2021-04-29 MED ORDER — DIPHENHYDRAMINE HCL 25 MG PO CAPS: 25.0000 mg | ORAL_CAPSULE | Freq: Four times a day (QID) | ORAL | Status: DC | PRN

## 2021-04-29 MED ORDER — DEXAMETHASONE SODIUM PHOSPHATE 4 MG/ML IJ SOLN
INTRAMUSCULAR | Status: DC | PRN
  Administered 2021-04-29: 8 mg via INTRAVENOUS

## 2021-04-29 MED ORDER — CEFAZOLIN SODIUM-DEXTROSE 2-4 GM/100ML-% IV SOLN
INTRAVENOUS | Status: AC
  Filled 2021-04-29: qty 100

## 2021-04-29 MED ORDER — DIBUCAINE (PERIANAL) 1 % EX OINT: 1.0000 "application " | TOPICAL_OINTMENT | CUTANEOUS | Status: DC | PRN

## 2021-04-29 MED ORDER — WITCH HAZEL-GLYCERIN EX PADS: 1.0000 "application " | MEDICATED_PAD | CUTANEOUS | Status: DC | PRN

## 2021-04-29 MED ORDER — MEDROXYPROGESTERONE ACETATE 150 MG/ML IM SUSP: 150.0000 mg | INTRAMUSCULAR | Status: DC | PRN

## 2021-04-29 MED ORDER — POVIDONE-IODINE 10 % EX SWAB: 2.0000 "application " | Freq: Once | CUTANEOUS | Status: DC

## 2021-04-29 MED ORDER — FENTANYL CITRATE (PF) 100 MCG/2ML IJ SOLN
INTRAMUSCULAR | Status: AC
  Filled 2021-04-29: qty 2

## 2021-04-29 MED ORDER — ONDANSETRON HCL 4 MG/2ML IJ SOLN
INTRAMUSCULAR | Status: AC
  Filled 2021-04-29: qty 2

## 2021-04-29 MED ORDER — MORPHINE SULFATE (PF) 0.5 MG/ML IJ SOLN
INTRAMUSCULAR | Status: AC
  Filled 2021-04-29: qty 10

## 2021-04-29 MED ORDER — FENTANYL CITRATE (PF) 100 MCG/2ML IJ SOLN
INTRAMUSCULAR | Status: DC | PRN
  Administered 2021-04-29: 15 ug via INTRATHECAL

## 2021-04-29 MED ORDER — MEASLES, MUMPS & RUBELLA VAC IJ SOLR
0.5000 mL | Freq: Once | INTRAMUSCULAR | Status: AC
  Administered 2021-05-01: 0.5 mL via SUBCUTANEOUS
  Filled 2021-04-29: qty 0.5

## 2021-04-29 MED ORDER — TETANUS-DIPHTH-ACELL PERTUSSIS 5-2.5-18.5 LF-MCG/0.5 IM SUSY: 0.5000 mL | PREFILLED_SYRINGE | Freq: Once | INTRAMUSCULAR | Status: DC

## 2021-04-29 MED ORDER — ACETAMINOPHEN 500 MG PO TABS: 1000.0000 mg | ORAL_TABLET | Freq: Once | ORAL | Status: DC | PRN

## 2021-04-29 MED ORDER — SIMETHICONE 80 MG PO CHEW
80.0000 mg | CHEWABLE_TABLET | ORAL | Status: DC | PRN
  Administered 2021-04-29: 80 mg via ORAL

## 2021-04-29 MED ORDER — OXYTOCIN-SODIUM CHLORIDE 30-0.9 UT/500ML-% IV SOLN
INTRAVENOUS | Status: AC
  Filled 2021-04-29: qty 500

## 2021-04-29 MED ORDER — ENOXAPARIN SODIUM 40 MG/0.4ML IJ SOSY
40.0000 mg | PREFILLED_SYRINGE | INTRAMUSCULAR | Status: DC
  Administered 2021-04-29 – 2021-04-30 (×2): 40 mg via SUBCUTANEOUS
  Filled 2021-04-29 (×2): qty 0.4

## 2021-04-29 MED ORDER — PROMETHAZINE HCL 25 MG/ML IJ SOLN: 6.2500 mg | INTRAMUSCULAR | Status: DC | PRN

## 2021-04-29 MED ORDER — LACTATED RINGERS IV SOLN: 125.0000 mL/h | INTRAVENOUS | Status: DC

## 2021-04-29 MED ORDER — SENNOSIDES-DOCUSATE SODIUM 8.6-50 MG PO TABS
2.0000 | ORAL_TABLET | Freq: Every day | ORAL | Status: DC
  Administered 2021-04-30 – 2021-05-01 (×2): 2 via ORAL
  Filled 2021-04-29 (×2): qty 2

## 2021-04-29 MED ORDER — OXYTOCIN-SODIUM CHLORIDE 30-0.9 UT/500ML-% IV SOLN: 2.5000 [IU]/h | INTRAVENOUS | Status: AC

## 2021-04-29 MED ORDER — ONDANSETRON HCL 4 MG/2ML IJ SOLN
INTRAMUSCULAR | Status: DC | PRN
  Administered 2021-04-29: 4 mg via INTRAVENOUS

## 2021-04-29 MED ORDER — COCONUT OIL OIL: 1.0000 "application " | TOPICAL_OIL | Status: DC | PRN

## 2021-04-29 MED ORDER — DEXAMETHASONE SODIUM PHOSPHATE 4 MG/ML IJ SOLN
INTRAMUSCULAR | Status: AC
  Filled 2021-04-29: qty 1

## 2021-04-29 MED ORDER — MORPHINE SULFATE (PF) 0.5 MG/ML IJ SOLN
INTRAMUSCULAR | Status: DC | PRN
  Administered 2021-04-29: 150 ug via INTRATHECAL

## 2021-04-29 MED ORDER — OXYTOCIN-SODIUM CHLORIDE 30-0.9 UT/500ML-% IV SOLN
INTRAVENOUS | Status: DC | PRN
  Administered 2021-04-29: 30 [IU] via INTRAVENOUS

## 2021-04-29 MED ORDER — STERILE WATER FOR IRRIGATION IR SOLN
Status: DC | PRN
  Administered 2021-04-29: 1000 mL

## 2021-04-29 MED ORDER — SODIUM CHLORIDE 0.9 % IR SOLN
Status: DC | PRN
  Administered 2021-04-29: 1000 mL

## 2021-04-29 MED ORDER — PHENYLEPHRINE HCL-NACL 20-0.9 MG/250ML-% IV SOLN
INTRAVENOUS | Status: AC
  Filled 2021-04-29: qty 250

## 2021-04-29 MED ORDER — ACETAMINOPHEN 10 MG/ML IV SOLN: 1000.0000 mg | Freq: Once | INTRAVENOUS | Status: DC | PRN

## 2021-04-29 MED ORDER — TRANEXAMIC ACID-NACL 1000-0.7 MG/100ML-% IV SOLN
INTRAVENOUS | Status: DC | PRN
  Administered 2021-04-29: 1000 mg via INTRAVENOUS

## 2021-04-29 MED ORDER — FENTANYL CITRATE (PF) 100 MCG/2ML IJ SOLN: 25.0000 ug | INTRAMUSCULAR | Status: DC | PRN

## 2021-04-29 MED ORDER — SIMETHICONE 80 MG PO CHEW
80.0000 mg | CHEWABLE_TABLET | Freq: Three times a day (TID) | ORAL | Status: DC
  Administered 2021-04-29 – 2021-05-01 (×4): 80 mg via ORAL
  Filled 2021-04-29 (×4): qty 1

## 2021-04-29 MED ORDER — PHENYLEPHRINE HCL-NACL 20-0.9 MG/250ML-% IV SOLN
INTRAVENOUS | Status: DC | PRN
  Administered 2021-04-29: 60 ug/min via INTRAVENOUS

## 2021-04-29 MED ORDER — CEFAZOLIN SODIUM-DEXTROSE 2-4 GM/100ML-% IV SOLN: 2.0000 g | INTRAVENOUS | Status: DC

## 2021-04-29 MED ORDER — CEFAZOLIN SODIUM-DEXTROSE 2-3 GM-%(50ML) IV SOLR
INTRAVENOUS | Status: DC | PRN
  Administered 2021-04-29: 2 g via INTRAVENOUS

## 2021-04-29 MED ORDER — IBUPROFEN 600 MG PO TABS
600.0000 mg | ORAL_TABLET | Freq: Four times a day (QID) | ORAL | Status: DC
  Administered 2021-04-30 – 2021-05-01 (×4): 600 mg via ORAL
  Filled 2021-04-29 (×4): qty 1

## 2021-04-29 MED ORDER — MENTHOL 3 MG MT LOZG: 1.0000 | LOZENGE | OROMUCOSAL | Status: DC | PRN

## 2021-04-29 MED ORDER — ACETAMINOPHEN 500 MG PO TABS
1000.0000 mg | ORAL_TABLET | Freq: Four times a day (QID) | ORAL | Status: DC
  Administered 2021-04-29 – 2021-05-01 (×7): 1000 mg via ORAL
  Filled 2021-04-29 (×8): qty 2

## 2021-04-29 MED ORDER — TRANEXAMIC ACID-NACL 1000-0.7 MG/100ML-% IV SOLN
INTRAVENOUS | Status: AC
  Filled 2021-04-29: qty 100

## 2021-04-29 MED ORDER — BUPIVACAINE IN DEXTROSE 0.75-8.25 % IT SOLN
INTRATHECAL | Status: DC | PRN
  Administered 2021-04-29: 1.6 mL via INTRATHECAL

## 2021-04-29 MED ORDER — OXYCODONE HCL 5 MG PO TABS
5.0000 mg | ORAL_TABLET | ORAL | Status: DC | PRN
  Administered 2021-05-01: 5 mg via ORAL
  Filled 2021-04-29: qty 1

## 2021-04-29 MED ORDER — KETOROLAC TROMETHAMINE 30 MG/ML IJ SOLN
30.0000 mg | Freq: Four times a day (QID) | INTRAMUSCULAR | Status: AC
  Administered 2021-04-29 – 2021-04-30 (×4): 30 mg via INTRAVENOUS
  Filled 2021-04-29 (×4): qty 1

## 2021-04-29 MED ORDER — PRENATAL MULTIVITAMIN CH
1.0000 | ORAL_TABLET | Freq: Every day | ORAL | Status: DC
  Administered 2021-04-29 – 2021-04-30 (×2): 1 via ORAL
  Filled 2021-04-29 (×2): qty 1

## 2021-04-29 SURGICAL SUPPLY — 31 items
BENZOIN TINCTURE PRP APPL 2/3 (GAUZE/BANDAGES/DRESSINGS) IMPLANT
CLOTH BEACON ORANGE TIMEOUT ST (SAFETY) ×2 IMPLANT
DRAPE C SECTION CLR SCREEN (DRAPES) ×2 IMPLANT
DRSG OPSITE POSTOP 4X10 (GAUZE/BANDAGES/DRESSINGS) ×2 IMPLANT
ELECT REM PT RETURN 9FT ADLT (ELECTROSURGICAL) ×2
ELECTRODE REM PT RTRN 9FT ADLT (ELECTROSURGICAL) ×1 IMPLANT
EXTRACTOR VACUUM KIWI (MISCELLANEOUS) ×2 IMPLANT
GAUZE SPONGE 4X4 12PLY STRL LF (GAUZE/BANDAGES/DRESSINGS) ×4 IMPLANT
GLOVE BIOGEL PI IND STRL 7.0 (GLOVE) ×1 IMPLANT
GLOVE BIOGEL PI INDICATOR 7.0 (GLOVE) ×1
GLOVE SURG ORTHO 8.0 STRL STRW (GLOVE) ×2 IMPLANT
GOWN STRL REUS W/TWL LRG LVL3 (GOWN DISPOSABLE) ×4 IMPLANT
KIT ABG SYR 3ML LUER SLIP (SYRINGE) IMPLANT
NEEDLE HYPO 25X5/8 SAFETYGLIDE (NEEDLE) IMPLANT
NS IRRIG 1000ML POUR BTL (IV SOLUTION) ×2 IMPLANT
PACK C SECTION WH (CUSTOM PROCEDURE TRAY) ×2 IMPLANT
PAD ABD 7.5X8 STRL (GAUZE/BANDAGES/DRESSINGS) ×2 IMPLANT
PAD OB MATERNITY 4.3X12.25 (PERSONAL CARE ITEMS) ×2 IMPLANT
PENCIL SMOKE EVAC W/HOLSTER (ELECTROSURGICAL) ×2 IMPLANT
RTRCTR C-SECT PINK 25CM LRG (MISCELLANEOUS) IMPLANT
STRIP CLOSURE SKIN 1/2X4 (GAUZE/BANDAGES/DRESSINGS) IMPLANT
SUT MON AB-0 CT1 36 (SUTURE) ×4 IMPLANT
SUT PLAIN 0 NONE (SUTURE) IMPLANT
SUT VIC AB 0 CT1 27 (SUTURE) ×2
SUT VIC AB 0 CT1 27XBRD ANBCTR (SUTURE) ×2 IMPLANT
SUT VIC AB 2-0 CT1 27 (SUTURE) ×1
SUT VIC AB 2-0 CT1 TAPERPNT 27 (SUTURE) ×1 IMPLANT
SUT VIC AB 4-0 KS 27 (SUTURE) ×2 IMPLANT
TOWEL OR 17X24 6PK STRL BLUE (TOWEL DISPOSABLE) ×2 IMPLANT
TRAY FOLEY W/BAG SLVR 14FR LF (SET/KITS/TRAYS/PACK) ×2 IMPLANT
WATER STERILE IRR 1000ML POUR (IV SOLUTION) ×2 IMPLANT

## 2021-04-29 NOTE — Anesthesia Preprocedure Evaluation (Addendum)
Anesthesia Evaluation  Patient identified by MRN, date of birth, ID band Patient awake    Reviewed: Allergy & Precautions, NPO status , Patient's Chart, lab work & pertinent test results  History of Anesthesia Complications Negative for: history of anesthetic complications  Airway Mallampati: II  TM Distance: >3 FB Neck ROM: Full    Dental  (+) Dental Advisory Given, Teeth Intact   Pulmonary neg pulmonary ROS, neg shortness of breath, neg sleep apnea, neg COPD, neg recent URI,  Covid-19 Nucleic Acid Test Results Lab Results      Component                Value               Date                      SARSCOV2NAA              NEGATIVE            04/27/2021                SARSCOV2NAA              Detected (A)        10/16/2019                SARSCOV2NAA              Detected (A)        10/09/2019              breath sounds clear to auscultation       Cardiovascular negative cardio ROS   Rhythm:Regular     Neuro/Psych negative neurological ROS  negative psych ROS   GI/Hepatic   Endo/Other  diabetes, Gestational  Renal/GU      Musculoskeletal   Abdominal   Peds  Hematology  (+) Blood dyscrasia, anemia , Lab Results      Component                Value               Date                      WBC                      5.2                 04/27/2021                HGB                      10.6 (L)            04/27/2021                HCT                      33.5 (L)            04/27/2021                MCV                      75.1 (L)            04/27/2021                PLT  282                 04/27/2021            Denies blood thinners   Anesthesia Other Findings   Reproductive/Obstetrics (+) Pregnancy CS x 4                            Anesthesia Physical Anesthesia Plan  ASA: 2  Anesthesia Plan: Spinal   Post-op Pain Management:    Induction:   PONV Risk  Score and Plan: 2 and Ondansetron and Scopolamine patch - Pre-op  Airway Management Planned: Nasal Cannula  Additional Equipment: None  Intra-op Plan:   Post-operative Plan:   Informed Consent: I have reviewed the patients History and Physical, chart, labs and discussed the procedure including the risks, benefits and alternatives for the proposed anesthesia with the patient or authorized representative who has indicated his/her understanding and acceptance.     Dental advisory given and Interpreter used for interveiw  Plan Discussed with: CRNA  Anesthesia Plan Comments:         Anesthesia Quick Evaluation

## 2021-04-29 NOTE — Anesthesia Postprocedure Evaluation (Signed)
Anesthesia Post Note  Patient: FAIGE SEELY  Procedure(s) Performed: CESAREAN SECTION     Patient location during evaluation: PACU Anesthesia Type: Spinal Level of consciousness: awake and alert Pain management: pain level controlled Vital Signs Assessment: post-procedure vital signs reviewed and stable Respiratory status: spontaneous breathing, nonlabored ventilation, respiratory function stable and patient connected to nasal cannula oxygen Cardiovascular status: blood pressure returned to baseline and stable Postop Assessment: no apparent nausea or vomiting and spinal receding Anesthetic complications: no   No notable events documented.  Last Vitals:  Vitals:   04/29/21 1127 04/29/21 1134  BP: (!) 92/51 91/61  Pulse: 74 83  Resp: 15 17  Temp:  36.7 C  SpO2: 100% 99%    Last Pain:  Vitals:   04/29/21 1134  TempSrc: Oral  PainSc: 0-No pain   Pain Goal: Patients Stated Pain Goal: 4 (04/29/21 0740)              Epidural/Spinal Function Cutaneous sensation: Tingles (04/29/21 1115), Patient able to flex knees: (can rock legs) (04/29/21 1115), Patient able to lift hips off bed: No (04/29/21 1115), Back pain beyond tenderness at insertion site: No (04/29/21 1115), Progressively worsening motor and/or sensory loss: No (04/29/21 1115), Bowel and/or bladder incontinence post epidural: No (04/29/21 1115)  Janie Capp

## 2021-04-29 NOTE — Op Note (Signed)
[name] PROCEDURE DATE: 04/29/2021  PREOPERATIVE DIAGNOSES: Intrauterine pregnancy at [redacted]w[redacted]d weeks gestation; previous uterine incision previous cesarean section x 3.  POSTOPERATIVE DIAGNOSES: The same  PROCEDURE: Repeat Low Transverse Cesarean Section  SURGEON:  Dr. Mariel Aloe  ASSISTANT:  Dr. Lindley Magnus, Evalina Field, MD  ANESTHESIOLOGY TEAM: Anesthesiologist: Val Eagle, MD CRNA: Rosiland Oz, CRNA  INDICATIONS: [name] is a 35 y.o. [id number] at [redacted]w[redacted]d here for cesarean section secondary to the indications listed under preoperative diagnoses; please see preoperative note for further details.  The risks of surgery were discussed with the patient including but were not limited to: bleeding which may require transfusion or reoperation; infection which may require antibiotics; injury to bowel, bladder, ureters or other surrounding organs; injury to the fetus; need for additional procedures including hysterectomy in the event of a life-threatening hemorrhage; formation of adhesions; placental abnormalities wth subsequent pregnancies; incisional problems; thromboembolic phenomenon and other postoperative/anesthesia complications.  The patient concurred with the proposed plan, giving informed written consent for the procedure.    FINDINGS:  Viable female infant in cephalic presentation.  Apgars 8 and 9.  Clear amniotic fluid.  Intact placenta, three vessel cord.  Normal uterus, fallopian tubes and ovaries bilaterally.  Minimal intra-abdominal adhesive disease  ANESTHESIA: Spinal INTRAVENOUS FLUIDS: 1300 ml   ESTIMATED BLOOD LOSS: 274 ml URINE OUTPUT:  300 ml SPECIMENS: Placenta sent to L&D COMPLICATIONS: None immediate  PROCEDURE IN DETAIL:  The patient preoperatively received intravenous antibiotics and had sequential compression devices applied to her lower extremities.  She was then taken to the operating room where spinal anesthesia was administered in sterile fashion and  was found to be adequate. She was then placed in a dorsal supine position with a leftward tilt, and prepped and draped in a sterile manner.  A foley catheter was placed into her bladder and attached to constant gravity.  After an adequate timeout was performed, a Pfannenstiel skin incision was made with scalpel two fingerbreaths on her preexisting scarand carried through to the underlying layer of fascia. The fascia was incised in the midline, and this incision was extended bilaterally using the Mayo scissors.  Kocher clamps x 2 were applied to the superior aspect of the fascial incision and the underlying rectus muscles were dissected off bluntly and sharply.  A similar process was carried out on the inferior aspect of the fascial incision. The rectus muscles were separated in the midline and the peritoneum was entered bluntly. The Alexis self-retaining retractor was introduced into the abdominal cavity.  The vesicouterine peritoneum was identified and grasped using smooth pickups.  It was incised and extended laterally using the Metzenbaum scissors.  A bladder flap was then digitally created.  Attention was turned to the lower uterine segment where a low transverse hysterotomy was made with a scalpel and extended bilaterally bluntly.  The infant was successfully delivered, the cord was clamped and cut after one minute, and the infant was handed over to the awaiting neonatology team. Uterine massage was then administered, and the placenta delivered intact with a three-vessel cord. The uterus was then cleared of clots and debris using manual curettage.  The uterine incision was closed with 1-0 monocryl in a running locked fashion, and an imbricating layer was also placed with 1-0 monocryl.    The pelvis was cleared of all clot and debris with irrigation and suction. Hemostasis was confirmed on all surfaces.  The retractor was removed.  The  rectus muscles were reapproximated using 0 Vicryl  with a mattress suture.  The fascia was then closed using 0 Vicryl in a running fashion.  The subcutaneous layer was irrigated and small bleeders were cauterized, The skin was closed with a 4-0 Vicryl subcuticular stitch. The patient tolerated the procedure well. Sponge, instrument and needle counts were correct x 3.  She was taken to the recovery room in stable condition.    Mariel Aloe, MD, FACOG Obstetrician & Gynecologist, [facility] for [facility], [facility]

## 2021-04-29 NOTE — Anesthesia Procedure Notes (Signed)
Spinal  Patient location during procedure: OR Start time: 04/29/2021 9:18 AM End time: 04/29/2021 9:22 AM Reason for block: surgical anesthesia Staffing Performed: anesthesiologist  Anesthesiologist: Val Eagle, MD Preanesthetic Checklist Completed: patient identified, IV checked, risks and benefits discussed, surgical consent, monitors and equipment checked, pre-op evaluation and timeout performed Spinal Block Patient position: sitting Prep: DuraPrep Patient monitoring: heart rate, cardiac monitor, continuous pulse ox and blood pressure Approach: midline Location: L3-4 Injection technique: single-shot Needle Needle type: Tuohy and Pencan  Needle gauge: 25 G Needle length: 12.7 cm Assessment Sensory level: T6 Events: CSF return Additional Notes 17g Touhy placed, LOR to air at 5cm, 25g Pencan inserted through Touhy with CSF return, intrathecal meds administered, Pencan removed, 19g non-flex catheter inserted through touhy, catheter migrated intravascularly and was not used.

## 2021-04-29 NOTE — Transfer of Care (Signed)
Immediate Anesthesia Transfer of Care Note  Patient: [name]  Procedure(s) Performed: CESAREAN SECTION  Patient Location: PACU  Anesthesia Type:MAC combined with regional for post-op pain  Level of Consciousness: awake and patient cooperative  Airway & Oxygen Therapy: Patient Spontanous Breathing  Post-op Assessment: Report given to RN and Post -op Vital signs reviewed and stable  Post vital signs: Reviewed and stable  Last Vitals:  Vitals Value Taken Time  BP 91/55 04/29/21 1030  Temp    Pulse 100 04/29/21 1032  Resp 16 04/29/21 1032  SpO2 100 % 04/29/21 1032  Vitals shown include unvalidated device data.  Last Pain:  Vitals:   04/29/21 0740  TempSrc:   PainSc: 0-No pain      Patients Stated Pain Goal: 4 (45/62/56 3893)  Complications: No notable events documented.

## 2021-04-29 NOTE — H&P (Signed)
LABOR AND DELIVERY ADMISSION HISTORY AND PHYSICAL NOTE  [name] is a 35 y.o. female [phone] with IUP at 36w0dby lmp presenting for scheduled repeat cesarean section.   She reports positive fetal movement. She denies leakage of fluid or vaginal bleeding.  Prenatal History/Complications:  Past Medical History: Past Medical History:  Diagnosis Date   Anemia    Gestational diabetes 12/01/2020    Past Surgical History: Past Surgical History:  Procedure Laterality Date   CESAREAN SECTION  2009; 2011; 2016   [place]  CESAREAN SECTION N/A 12/22/2014   Procedure: CESAREAN SECTION;  Surgeon: UOsborne Oman MD;  [facility];  Service: Obstetrics;  Laterality: N/A;   CHOLECYSTECTOMY N/A 01/31/2017   Procedure: LAPAROSCOPIC CHOLECYSTECTOMY WITH INTRAOPERATIVE CHOLANGIOGRAM; REPAIR OF UMBILICAL HERNIA;  Surgeon: MDonnie Mesa MD;  [facility]  Service: General;  Laterality: N/A;   CHOLECYSTECTOMY     ESOPHAGOGASTRODUODENOSCOPY N/A 01/29/2017   Procedure: ESOPHAGOGASTRODUODENOSCOPY (EGD);  Surgeon: PCarol Ada MD;  [facility];  Service: Endoscopy;  Laterality: N/A;   Female Circumcision      Obstetrical History: OB History     Gravida  5   Para  3   Term  3   Preterm  0   AB  1   Living  3      SAB  1   IAB  0   Ectopic  0   Multiple  0   Live Births  3        [place]        Social History: Social History   Socioeconomic History   Marital status: Married    Spouse name: Not on file   Number of children: Not on file   Years of education: Not on file   Highest education level: Not on file  Occupational History   Not on file  Tobacco Use   Smoking status: Never   Smokeless tobacco: Never  Vaping Use   Vaping Use: Never used  Substance and Sexual Activity   Alcohol use: No    Alcohol/week: 0.0 standard drinks   Drug use: No   Sexual activity: Yes    Partners: Male    Birth  control/protection: None    Comment: 1ST INTERCOURSE- 20, PARTNERS- 1, MARRIED- 134YRS   Other Topics Concern   Not on file  Social History Narrative   Immigrant Social History:   - [place]   - Language: Arabic, [place]              -Requires intepreter (essentially speaks no [place]   - Education: Highest level of education: 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, pProbation officer   - Prior work:None   - Other important info: Both children born via c-section (failure to progress)     - Best contact name and number: [phone]  - Tobacco/alcohol/drug use: No tobacco/alcohol/drugs   - Marriage Status: Married (7 years), 2 children   - Documented IOM Health Problems: None   - [place]   Social Determinants of HRadio broadcast assistantStrain: Not on file  Food Insecurity: Food Insecurity Present   Worried About RCharity fundraiserin the Last Year: Sometimes true   RArboriculturistin the Last Year: Sometimes true  Transportation Needs: No Transportation Needs   Lack of Transportation (Medical): No   Lack of Transportation (Non-Medical):  No  Physical Activity: Not on file  Stress: Not on file  Social Connections: Not on file    Family History: Family History  Problem Relation Age of Onset   Hypertension Mother     Allergies: No Known Allergies  Facility-Administered Medications Prior to Admission  Medication Dose Route Frequency Provider Last Rate Last Admin   levonorgestrel (LILETTA) 20.1 MCG/DAY IUD 1 each  1 each Intrauterine Once Truett Mainland, DO       Medications Prior to Admission  Medication Sig Dispense Refill Last Dose   aspirin EC 81 MG tablet Take 1 tablet (81 mg total) by mouth daily. Swallow whole. 30 tablet 11    docusate sodium (COLACE) 100 MG capsule Take 1 capsule (100 mg total) by mouth 2 (two) times daily as needed. (Patient taking differently: Take 100 mg by mouth 2 (two) times daily as needed for moderate  constipation.) 30 capsule 2 Past Week   ferrous sulfate 325 (65 FE) MG tablet Take 1 tablet (325 mg total) by mouth daily with breakfast. 30 tablet 3 04/28/2021   insulin NPH Human (NOVOLIN N) 100 UNIT/ML injection Inject 0.1 mLs (10 Units total) into the skin at bedtime. (Patient taking differently: Inject 10 Units into the skin 2 (two) times daily before a meal.) 10 mL 3 04/28/2021 at 1100   metFORMIN (GLUCOPHAGE) 1000 MG tablet Take 1 tablet (1,000 mg total) by mouth 2 (two) times daily with a meal. 60 tablet 3 04/28/2021 at 1100   Prenatal Vit-Fe Fumarate-FA (PRENATAL VITAMIN AND MINERAL) 28-0.8 MG TABS Take 1 tablet by mouth daily. 30 tablet 6 04/28/2021   Accu-Chek Softclix Lancets lancets 1 each by Other route 4 (four) times daily. Use as instructed 100 each 2    Blood Glucose Monitoring Suppl (ACCU-CHEK NANO SMARTVIEW) w/Device KIT 1 kit by Subdermal route as directed. Check blood sugars for fasting, and two hours after breakfast, lunch and dinner (4 checks daily) 1 kit 0    glucose blood (ACCU-CHEK SMARTVIEW) test strip Use as instructed to check blood sugars 100 each 12    Insulin Syringe-Needle U-100 (INSULIN SYRINGE .3CC/31GX5/16") 31G X 5/16" 0.3 ML MISC 1 Syringe by Does not apply route in the morning and at bedtime. (Patient not taking: No sig reported) 100 each 3      Review of Systems   All systems reviewed and negative except as stated in HPI  Blood pressure 125/88, pulse 89, temperature 98.2 F (36.8 C), temperature source Oral, resp. rate 16, height 5' 3"  (1.6 m), weight 81.6 kg, last menstrual period 07/30/2020, SpO2 100 %, unknown if currently breastfeeding. General appearance: alert, cooperative, and appears stated age Lungs: clear to auscultation bilaterally Heart: regular rate and rhythm Abdomen: soft, non-tender; bowel sounds normal Extremities: No calf swelling or tenderness Presentation: cephalic Fetal monitoring: 133 Uterine activity: quiet     Prenatal  labs: ABO, Rh: --/--/A POS (07/26 6837) Antibody: NEG (07/26 0958) Rubella: <0.90 (12/01 1011) RPR: NON REACTIVE (07/26 1030)  HBsAg: Negative (12/01 1011)  HIV: Non Reactive (04/28 0924)  GBS: Negative/-- (07/14 1636)  2 hr Glucola: abnormal Genetic screening:  negative Anatomy US: wnl  Prenatal Transfer Tool  Maternal Diabetes: Yes:  Diabetes Type:  Insulin/Medication controlled Genetic Screening: Normal Maternal Ultrasounds/Referrals: Normal Fetal Ultrasounds or other Referrals:  Fetal echo normal Maternal Substance Abuse:  No Significant Maternal Medications:  Meds include: Other: insulin Significant Maternal Lab Results: None  Results for orders placed or performed during the hospital encounter of  04/29/21 (from the past 24 hour(s))  Glucose, capillary   Collection Time: 04/29/21  7:41 AM  Result Value Ref Range   Glucose-Capillary 73 70 - 99 mg/dL   Comment 1 Notify RN    Comment 2 Document in Chart     Patient Active Problem List   Diagnosis Date Noted   History of 3 cesarean sections 04/26/2021   Rubella non-immune status, antepartum 12/01/2020   Gestational diabetes 12/01/2020   Encounter for supervision of high risk multigravida of advanced maternal age, antepartum 09/30/2020   Vision disturbance 09/02/2020   Prediabetes 02/18/2020   Hypopigmentation 08/29/2018   Female genital mutilation with clitorectomy 01/07/2016   Seasonal allergies 12/08/2015   Previous cesarean delivery, antepartum condition or complication 11/13/1733   Anemia affecting pregnancy 09/03/2014   Immigrant with language difficulty 08/04/2014   Language barrier 08/04/2014    Assessment: MARTY SADLOWSKI is a 35 y.o. [id number] at 64w0dhere for scheduled repeat cesarean section. History cesarean x3.  The risks of cesarean section were discussed with the patient including but were not limited to: bleeding which may require transfusion or reoperation; infection which may require antibiotics; injury  to bowel, bladder, ureters or other surrounding organs; injury to the fetus; need for additional procedures including hysterectomy in the event of a life-threatening hemorrhage; placental abnormalities wth subsequent pregnancies, incisional problems, thromboembolic phenomenon and other postoperative/anesthesia complications.   Patient has been NPO since midhight she will remain NPO for procedure. Anesthesia and OR aware.  Preoperative prophylactic antibiotics and SCDs ordered on call to the OR.  To OR when ready.   # RNI: MMR PP # Anemia: hgb 10.6 # A2GDM: fasting glucose 73 today. Will plan to monitor fastings PP # Contraception: IUD at postpartum f/u # Circumcision: no # Feeding: breast  NOlen CordialB Dominiq Fontaine 04/29/2021, 8:35 AM

## 2021-04-29 NOTE — Discharge Summary (Signed)
Postpartum Discharge Summary  Date of Service updated 05/01/21     Patient Name: [name] DOB: 1986-01-16 MRN: [id number]  Date of admission: 04/29/2021 Delivery date:04/29/2021  Delivering provider: Griffin Basil  Date of discharge: 05/01/2021  Admitting diagnosis: RCS 4th; Vacuum assisted Intrauterine pregnancy: [redacted]w[redacted]d    Secondary diagnosis:  Principal Problem:   Cesarean delivery with vacuum assistance, delivered, current hospitalization Active Problems:   Language barrier   Anemia affecting pregnancy   Rubella non-immune status, antepartum   Gestational diabetes   History of 3 cesarean sections   Cesarean delivery delivered  Additional problems: None    Discharge diagnosis: Term Pregnancy Delivered                                              Post partum procedures: None Augmentation: N/A Complications: None  Hospital course: Sceduled C/S   35y.o. yo [id number] 328w0das admitted to the hospital 04/29/2021 for scheduled cesarean section with the following indication:Elective Repeat.Delivery details are as follows:  Membrane Rupture Time/Date: 9:48 AM ,04/29/2021   Delivery Method:C-Section, Vacuum Assisted  Details of operation can be found in separate operative note.  Patient had an uncomplicated postpartum course.  She is ambulating, tolerating a regular diet, passing flatus, and urinating well. Patient is discharged home in stable condition on  05/01/21        Newborn Data: Birth date:04/29/2021  Birth time:9:49 AM  Gender:Female  Living status:Living  Apgars:8 ,9  Weight:3220 g     Magnesium Sulfate received: No BMZ received: No Rhophylac:N/A MMR:Yes - needs T-DaP:Given prenatally Flu: No Transfusion:No   Physical exam  Vitals:   04/30/21 0502 04/30/21 1325 04/30/21 2100 05/01/21 0601  BP: (!) 98/56 92/71 91/61  107/84  Pulse: 69 85 80 79  Resp: 17 14 16 17   Temp:  98 F (36.7 C) 98.2 F (36.8 C) 98.6 F (37 C)  TempSrc:   Oral Oral  SpO2: 99%   100% 100%  Weight:      Height:       General: alert, cooperative, and no distress Lochia: appropriate Uterine Fundus: firm Incision: Dressing is clean, dry, and intact DVT Evaluation: No evidence of DVT seen on physical exam. Negative Homan's sign. No cords or calf tenderness. No significant calf/ankle edema. Labs: Lab Results  Component Value Date   WBC 9.4 04/30/2021   HGB 9.2 (L) 04/30/2021   HCT 29.2 (L) 04/30/2021   MCV 74.7 (L) 04/30/2021   PLT 263 04/30/2021   CMP Latest Ref Rng & Units 07/08/2020  Glucose 65 - 99 mg/dL 103(H)  BUN 6 - 20 mg/dL 11  Creatinine 0.57 - 1.00 mg/dL 0.60  Sodium 134 - 144 mmol/L 138  Potassium 3.5 - 5.2 mmol/L 4.4  Chloride 96 - 106 mmol/L 102  CO2 20 - 29 mmol/L 21  Calcium 8.7 - 10.2 mg/dL 9.2  Total Protein 6.0 - 8.5 g/dL 7.7  Total Bilirubin 0.0 - 1.2 mg/dL <0.2  Alkaline Phos 44 - 121 IU/L 85  AST 0 - 40 IU/L 16  ALT 0 - 32 IU/L 16   Edinburgh Score: Edinburgh Postnatal Depression Scale Screening Tool 04/29/2021  I have been able to laugh and see the funny side of things. 0  I have looked forward with enjoyment to things. 0  I have blamed myself unnecessarily when  things went wrong. 0  I have been anxious or worried for no good reason. 0  I have felt scared or panicky for no good reason. 0  Things have been getting on top of me. 0  I have been so unhappy that I have had difficulty sleeping. 0  I have felt sad or miserable. 0  I have been so unhappy that I have been crying. 0  The thought of harming myself has occurred to me. 0  Edinburgh Postnatal Depression Scale Total 0     After visit meds:  Allergies as of 05/01/2021   No Known Allergies      Medication List     STOP taking these medications    Accu-Chek Nano SmartView w/Device Kit   Accu-Chek SmartView test strip Generic drug: glucose blood   Accu-Chek Softclix Lancets lancets   aspirin EC 81 MG tablet   insulin NPH Human 100 UNIT/ML  injection Commonly known as: NOVOLIN N   INSULIN SYRINGE .3CC/31GX5/16" 31G X 5/16" 0.3 ML Misc   metFORMIN 1000 MG tablet Commonly known as: Glucophage       TAKE these medications    docusate sodium 100 MG capsule Commonly known as: COLACE Take 1 capsule (100 mg total) by mouth 2 (two) times daily as needed for moderate constipation.   ferrous sulfate 325 (65 FE) MG tablet Take 1 tablet (325 mg total) by mouth daily with breakfast.   ibuprofen 600 MG tablet Commonly known as: ADVIL Take 1 tablet (600 mg total) by mouth every 6 (six) hours.   oxyCODONE-acetaminophen 5-325 MG tablet Commonly known as: Percocet Take 1-2 tablets by mouth every 6 (six) hours as needed for severe pain.   Prenatal Vitamin and Mineral 28-0.8 MG Tabs Take 1 tablet by mouth daily.         Discharge home in stable condition Infant Feeding: Breast Infant Disposition:home with mother Discharge instruction: per After Visit Summary and Postpartum booklet. Activity: Advance as tolerated. Pelvic rest for 6 weeks.  Diet: routine diet Future Appointments: Future Appointments  Date Time Provider [place]  05/27/2021 10:35 AM Rasch, Artist Pais, NP [facility]   Follow up Visit:   Please schedule this patient for a In person postpartum visit in 6 weeks with the following provider: Any provider. Additional Postpartum F/U:Incision check 1 week; GTT at 6 week follow up High risk pregnancy complicated by: GDM and Hx CS x3 Delivery mode:  C-Section, Vacuum Assisted  Anticipated Birth Control:  IUD postpartum   05/01/2021 Fatima Blank, CNM

## 2021-04-29 NOTE — Lactation Note (Signed)
This note was copied from a baby's chart. Lactation Consultation Note  Patient Name: [name] VEHMC'N Date: 04/29/2021   Age:37 hours  Per RN, mother declined a lactation consult.     Maternal Data    Feeding    LATCH Score Latch: Grasps breast easily, tongue down, lips flanged, rhythmical sucking.  Audible Swallowing: A few with stimulation  Type of Nipple: Everted at rest and after stimulation  Comfort (Breast/Nipple): Soft / non-tender  Hold (Positioning): Assistance needed to correctly position infant at breast and maintain latch.  LATCH Score: 8   Lactation Tools Discussed/Used    Interventions    Discharge    Consult Status      [name] 04/29/2021, 2:27 PM

## 2021-04-30 LAB — CBC
HCT: 29.2 % — ABNORMAL LOW (ref 36.0–46.0)
Hemoglobin: 9.2 g/dL — ABNORMAL LOW (ref 12.0–15.0)
MCH: 23.5 pg — ABNORMAL LOW (ref 26.0–34.0)
MCHC: 31.5 g/dL (ref 30.0–36.0)
MCV: 74.7 fL — ABNORMAL LOW (ref 80.0–100.0)
Platelets: 263 10*3/uL (ref 150–400)
RBC: 3.91 MIL/uL (ref 3.87–5.11)
RDW: 15.9 % — ABNORMAL HIGH (ref 11.5–15.5)
WBC: 9.4 10*3/uL (ref 4.0–10.5)
nRBC: 0 % (ref 0.0–0.2)

## 2021-04-30 LAB — GLUCOSE, CAPILLARY: Glucose-Capillary: 80 mg/dL (ref 70–99)

## 2021-04-30 MED ORDER — FERROUS SULFATE 325 (65 FE) MG PO TABS: 325.0000 mg | ORAL_TABLET | Freq: Two times a day (BID) | ORAL | Status: DC

## 2021-04-30 MED ORDER — FERROUS SULFATE 325 (65 FE) MG PO TABS
325.0000 mg | ORAL_TABLET | ORAL | Status: DC
  Administered 2021-04-30: 325 mg via ORAL
  Filled 2021-04-30: qty 1

## 2021-04-30 NOTE — Progress Notes (Signed)
Subjective: Postpartum Day 1: Cesarean Delivery Patient reports incisional pain, tolerating PO, + flatus, + BM, and no problems voiding.    Objective: Vital signs in last 24 hours: Temp:  [97.8 F (36.6 C)-98.6 F (37 C)] 98.3 F (36.8 C) (07/29 0500) Pulse Rate:  [63-107] 69 (07/29 0502) Resp:  [14-19] 17 (07/29 0502) BP: (90-103)/(51-87) 98/56 (07/29 0502) SpO2:  [96 %-100 %] 99 % (07/29 0502)  Physical Exam:  General: alert, cooperative, and no distress Lochia: appropriate Uterine Fundus: firm Incision: no significant drainage, no dehiscence, no significant erythema DVT Evaluation: No evidence of DVT seen on physical exam.  Recent Labs    04/27/21 1030 04/30/21 0532  HGB 10.6* 9.2*  HCT 33.5* 29.2*    Assessment/Plan: Status post Cesarean section. Doing well postoperatively.  Continue current care. Patient ambulating without difficulty and reports pain as expected. Slight decrease in Hgb as anticipated, will start PO iron. Plan for discharge home tomorrow.   Rolm Bookbinder CNM 04/30/2021, 8:31 AM

## 2021-04-30 NOTE — Progress Notes (Signed)
Orthostatic vital signs done.  Pt ambulated to the bathroom with slow and steady gait.  States she has minimal pain. Peri care given with instructions.  Foley removed. Back to bed without incident

## 2021-05-01 MED ORDER — IBUPROFEN 600 MG PO TABS: 600.0000 mg | ORAL_TABLET | Freq: Four times a day (QID) | ORAL | 0 refills | Status: DC

## 2021-05-01 MED ORDER — DOCUSATE SODIUM 100 MG PO CAPS: 100.0000 mg | ORAL_CAPSULE | Freq: Two times a day (BID) | ORAL | 0 refills | Status: DC | PRN

## 2021-05-01 MED ORDER — OXYCODONE-ACETAMINOPHEN 5-325 MG PO TABS: 1.0000 | ORAL_TABLET | Freq: Four times a day (QID) | ORAL | 0 refills | Status: DC | PRN

## 2021-05-01 NOTE — Progress Notes (Signed)
Reviewed d/c instructions.  Patient declined interpreter.

## 2021-05-01 NOTE — Discharge Instructions (Signed)
Please remove honeycomb (clear) dressing from your incision site on Tuesday, 05/04/21, or sooner if it becomes damaged or wet.

## 2021-05-03 ENCOUNTER — Telehealth: Payer: Self-pay

## 2021-05-03 DIAGNOSIS — Z419 Encounter for procedure for purposes other than remedying health state, unspecified: Secondary | ICD-10-CM | POA: Diagnosis not present

## 2021-05-03 NOTE — Telephone Encounter (Signed)
Transition Care Management Follow-up Telephone Call Date of discharge and from where: 05/01/2021-[facility] How have you been since you were released from the hospital? Patient stated she is doing fine.  Any questions or concerns? No  Items Reviewed: Did the pt receive and understand the discharge instructions provided? Yes  Medications obtained and verified? Yes  Other? No  Any new allergies since your discharge? No  Dietary orders reviewed? N/A Do you have support at home? Yes   Home Care and Equipment/Supplies: Were home health services ordered? not applicable If so, what is the name of the agency? N/A  Has the agency set up a time to come to the patient's home? not applicable Were any new equipment or medical supplies ordered?  No What is the name of the medical supply agency? N/A Were you able to get the supplies/equipment? not applicable Do you have any questions related to the use of the equipment or supplies? No  Functional Questionnaire: (I = Independent and D = Dependent) ADLs: I  Bathing/Dressing- I  Meal Prep- I  Eating- I  Maintaining continence- I  Transferring/Ambulation- I  Managing Meds- I  Follow up appointments reviewed:  PCP Hospital f/u appt confirmed? No   Specialist Hospital f/u appt confirmed? Yes  Scheduled to see Venia Carbon, NP on 05/27/2021 @ 10:35 AM. Are transportation arrangements needed? No  If their condition worsens, is the pt aware to call PCP or go to the Emergency Dept.? Yes Was the patient provided with contact information for the PCP's office or ED? Yes Was to pt encouraged to call back with questions or concerns? Yes

## 2021-05-13 ENCOUNTER — Telehealth (HOSPITAL_COMMUNITY): Payer: Self-pay

## 2021-05-13 NOTE — Telephone Encounter (Signed)
No answer. Left message to return nurse call.  Marcelino Duster [facility] 05/13/2021,1504

## 2021-05-27 ENCOUNTER — Ambulatory Visit: Payer: Medicaid Other | Admitting: Obstetrics and Gynecology

## 2021-06-03 DIAGNOSIS — Z419 Encounter for procedure for purposes other than remedying health state, unspecified: Secondary | ICD-10-CM | POA: Diagnosis not present

## 2021-06-30 ENCOUNTER — Other Ambulatory Visit (HOSPITAL_COMMUNITY)
Admission: RE | Admit: 2021-06-30 | Discharge: 2021-06-30 | Disposition: A | Discharge status: Home / Self Care | Payer: Medicaid Other | Source: Ambulatory Visit | Attending: Obstetrics & Gynecology | Admitting: Obstetrics & Gynecology

## 2021-06-30 ENCOUNTER — Ambulatory Visit (INDEPENDENT_AMBULATORY_CARE_PROVIDER_SITE_OTHER): Payer: Medicaid Other | Admitting: Obstetrics & Gynecology

## 2021-06-30 ENCOUNTER — Other Ambulatory Visit: Payer: Self-pay

## 2021-06-30 ENCOUNTER — Encounter: Payer: Self-pay | Admitting: Obstetrics & Gynecology

## 2021-06-30 DIAGNOSIS — H538 Other visual disturbances: Secondary | ICD-10-CM

## 2021-06-30 DIAGNOSIS — Z124 Encounter for screening for malignant neoplasm of cervix: Secondary | ICD-10-CM | POA: Diagnosis not present

## 2021-06-30 DIAGNOSIS — Z8632 Personal history of gestational diabetes: Secondary | ICD-10-CM

## 2021-06-30 NOTE — Patient Instructions (Signed)
Intrauterine Device Insertion An intrauterine device (IUD) is a medical device that is inserted into the uterus to prevent pregnancy. It is a small, T-shaped device that has one or two nylon strings hanging down from it. The strings hang out of the lower part of the uterus (cervix) to allow for future IUD removal. There are two types of IUDs: Hormone IUD. This type of IUD is made of plastic and contains the hormone progestin (synthetic progesterone). A hormone IUD may last 3-5 years, depending on which one you have. Synthetic progesterone prevents pregnancy by: Thickening cervical mucus to prevent sperm from entering the uterus. Thinning the uterine lining to prevent a fertilized egg from implanting there. Copper IUD. This type of IUD has copper wire wrapped around it. A copper IUD may last up to 10 years. Copper prevents pregnancy by making the uterus and fallopian tubes produce a fluid that kills sperm. Tell a health care provider about: Any allergies you have. All medicines you are taking, including vitamins, herbs, eye drops, creams, and over-the-counter medicines. Any surgeries you have had. Any medical conditions you have, including any sexually transmitted infections (STIs) you may have. Whether you are pregnant or may be pregnant. What are the risks? Generally, this is a safe procedure. However, problems may occur, including: Infection. Bleeding. Allergic reactions to medicines. Puncture (perforation) of the uterus or damage to other structures or organs. Accidental placement of the IUD either in the muscle layer of the uterus (myometrium) or outside the uterus. The IUD falling out of the uterus (expulsion). This is more common among women who have recently had a child. Higher risk of an egg being fertilized outside your uterus (ectopic pregnancy).This is rare. Pelvic inflammatory disease (PID), which is an infection in the uterus and fallopian tubes. The IUD does not cause the  infection. The infection is usually from an unknown sexually transmitted infection (STI). This is rare, and it usually happens during the first 20 days after the IUD is inserted. What happens before the procedure? Ask your health care provider about: Changing or stopping your regular medicines. This is especially important if you are taking diabetes medicines or blood thinners. Taking over-the-counter medicines, vitamins, herbs, and supplements. Talk with your health care provider about when to schedule your IUD placement. Your health care provider may recommend taking over-the-counter pain medicines before the procedure. These medicines include ibuprofen and naproxen. You may have tests for: Pregnancy. A pregnancy test involves having a urine or blood sample taken. Sexually transmitted infections (STIs). Placing an IUD in someone who has an STI can make the infection worse. Cervical cancer. You may have a Pap test to check for this type of cancer. This means collecting cells from your cervix to be checked under a microscope. You may have a physical exam to determine the size and position of your uterus. What happens during the procedure? A tool (speculum) will be placed in your vagina and widened so that your health care provider can see your cervix. Medicine, or antiseptic, may be applied to your cervix to help lower your risk of infection. You may be given an anesthetic medicine to numb each side of your cervix. This medicine is usually given by an injection into the cervix. A tool called a uterine sound will be inserted into your uterus to check the length of your uterus and the direction that your uterus may be tilted. A slim instrument or tube (IUD inserter) that holds the IUD will be inserted into your vagina,   through your cervical canal, and into your uterus. The IUD will be placed in the uterus, and the IUD inserter will be removed. The strings that are attached to the IUD will be trimmed  so that they lie just below the cervix. The speculum will be removed. The procedure may vary among health care providers and hospitals. What can I expect after procedure? You may have bleeding after the procedure. This is normal. It varies from light bleeding (spotting) for a few days to menstrual-like bleeding. You may have cramping and pain in the abdomen. You may feel dizzy or light-headed. You may have lower back pain. You may have headaches and nausea. Follow these instructions at home: Before resuming sexual activity, check to make sure that you can feel the IUD string or strings. You should be able to feel the end of the string below the opening of your cervix. If your IUD string is in place, you may resume sexual activity. If you had a hormonal IUD inserted more than 7 days after your most recent period started, you will need to use a backup method of birth control for 7 days after IUD insertion. Ask your health care provider whether this applies to you. Continue to check that the IUD is still in place by feeling for the strings after every menstrual period, or once a month. An IUD will not protect you from sexually transmitted infections (STIs). Use methods to prevent the exchange of body fluids between partners (barrier protection) every time you have sex. Barrier protection can be used during oral, vaginal, or anal sex. Commonly used barrier methods include: Female condom. Female condom. Dental dam. Take over-the-counter and prescription medicines only as told by your health care provider. Keep all follow-up visits. This is important. Contact a health care provider if: You feel light-headed or weak. You have any of the following problems with your IUD string or strings: The string bothers or hurts you or your sexual partner. You cannot feel the string. The string has gotten longer. You can feel the IUD in your vagina. You think you may be pregnant, or you miss your menstrual  period. You think you may have a sexually transmitted infection (STI). Get help right away if you: You have flu-like symptoms, such as tiredness (fatigue) and muscle aches. You have a fever and chills. You have bleeding that is heavier or lasts longer than a normal menstrual cycle. You have abnormal or bad-smelling discharge from your vagina. You develop abdominal pain that is new, is getting worse, or is not in the same area of earlier cramping and pain. You have pain during sexual activity. Summary An intrauterine device (IUD) is a small, T-shaped device that has one or two nylon strings hanging down from it. You may have a copper IUD or a hormone IUD. Ask your health care provider what you need to do before the procedure. You may have some tests and you may have to change or stop some medicines. You may have bleeding after the procedure. This is normal. It varies from light spotting for a few days to menstrual-like bleeding. Check to make sure that you can feel the IUD strings before you resume sexual activity. Check the strings after every menstrual period or once a month. An IUD does not protect against STIs. Use other methods to protect yourself against infections. This information is not intended to replace advice given to you by your health care provider. Make sure you discuss any questions you have with   your health care provider. Document Revised: 04/01/2020 Document Reviewed: 04/01/2020 Elsevier Patient Education  2022 Elsevier Inc.  

## 2021-06-30 NOTE — Progress Notes (Signed)
Post Partum Visit Note  [name] is a 35 y.o. A1P3790 female who presents for a postpartum visit. Patient is Arabic-speaking only, interpreter present for this encounter.  She is 8 weeks postpartum following a repeat cesarean section.  I have fully reviewed the prenatal and intrapartum course. The delivery was at 39/0 gestational weeks, had insulin dependent GDM.  Anesthesia: spinal. Postpartum course has been uncomplicated. Baby is doing well. Baby is feeding by both breast and bottle - [employer] Good Start. Bleeding no bleeding. Bowel function is  Constipation . Bladder function is normal. Patient is sexually active, had unprotected intercourse two days ago. Contraception method is none. Postpartum depression screening: negative.  Patient does report intermittent blurry vision, desires Opthalmology referral. No headaches or other associated symptoms.  The pregnancy intention screening data noted above was reviewed. Potential methods of contraception were discussed. The patient elected to proceed with No data recorded.   Edinburgh Postnatal Depression Scale - 06/30/21 1430       Edinburgh Postnatal Depression Scale:  In the Past 7 Days   I have been able to laugh and see the funny side of things. 0    I have looked forward with enjoyment to things. 0    I have blamed myself unnecessarily when things went wrong. 0    I have been anxious or worried for no good reason. 0    I have felt scared or panicky for no good reason. 0    Things have been getting on top of me. 0    I have been so unhappy that I have had difficulty sleeping. 0    I have felt sad or miserable. 0    I have been so unhappy that I have been crying. 0    The thought of harming myself has occurred to me. 0    Edinburgh Postnatal Depression Scale Total 0             Health Maintenance Due  Topic Date Due   DTAP VACCINES (1) 02/27/1984   URINE MICROALBUMIN  Never done   COVID-19 Vaccine (3 - Booster for Pfizer  series) 01/17/2021   INFLUENZA VACCINE  05/03/2021    The following portions of the patient's history were reviewed and updated as appropriate: allergies, current medications, past family history, past medical history, past social history, past surgical history, and problem list.  Review of Systems Pertinent items noted in HPI and remainder of comprehensive ROS otherwise negative.  Objective:  BP 112/75   Pulse 87   Ht 5\' 3"  (1.6 m)   Wt 178 lb 3.2 oz (80.8 kg)   BMI 31.57 kg/m    General:  alert and no distress   Breasts:  normal  Lungs: clear to auscultation bilaterally  Heart:  regular rate and rhythm  Abdomen: soft, non-tender; bowel sounds normal; no masses,  no organomegaly   Wound Well healed cesarean section  GU exam:  normal vulva and vagina, normal discharge, pap smear done        Assessment and Plan:   Essential components of care per ACOG recommendations:  1.  Mood and well being: Patient with negative depression screening today. Reviewed local resources for support.  - Patient tobacco use? No.   - hx of drug use? No.    2. Infant care and feeding:  -Patient currently breastmilk feeding?  Yes. No issues. -Social determinants of health (SDOH) reviewed in EPIC. No concerns.  3. Sexuality, contraception and birth spacing -  Patient does not want a pregnancy in the next year.  Desired family size is 4 children.  - Reviewed forms of contraception in tiered fashion. Patient desired IUD , she will return for placement in two weeks.  Recommended protected intercourse until then.  She is deciding between hormonal and nonhormonal IUD, questions answered. - Discussed birth spacing of 18 months  4. Sleep and fatigue -Encouraged family/partner/community support of 4 hrs of uninterrupted sleep to help with mood and fatigue  5. Physical Recovery  - Discussed patients delivery and complications.  - Patient had a C-section. - Patient has urinary incontinence? No. - Patient  is safe to resume physical and sexual activity  6.  Health Maintenance - HM due items addressed Yes - Last pap smear 2019. Pap smear done at today's visit.  -Breast Cancer screening indicated? No.   7. Hx GDM - Return for 2 hr GTT - PCP follow up  8. Blurry vision - Referred to Opthalmology   Jaynie Collins, MD Center for [facility], [facility]

## 2021-06-30 NOTE — Progress Notes (Signed)
Pt wants IUD 

## 2021-07-01 ENCOUNTER — Telehealth: Payer: Self-pay

## 2021-07-01 NOTE — Telephone Encounter (Signed)
Called Pt using [employer] id# [phone] to go over her Eye appt. On 07/12/21 @ 2:30 p at [facility]. Pt verbalized understanding.

## 2021-07-02 LAB — CYTOLOGY - PAP
Comment: NEGATIVE
Diagnosis: NEGATIVE
High risk HPV: NEGATIVE

## 2021-07-03 DIAGNOSIS — Z419 Encounter for procedure for purposes other than remedying health state, unspecified: Secondary | ICD-10-CM | POA: Diagnosis not present

## 2021-07-14 ENCOUNTER — Ambulatory Visit (INDEPENDENT_AMBULATORY_CARE_PROVIDER_SITE_OTHER): Payer: Medicaid Other | Admitting: Nurse Practitioner

## 2021-07-14 ENCOUNTER — Encounter: Payer: Self-pay | Admitting: Nurse Practitioner

## 2021-07-14 ENCOUNTER — Other Ambulatory Visit: Payer: Self-pay

## 2021-07-14 VITALS — BP 111/83 | HR 86 | Ht 60.0 in | Wt 170.7 lb

## 2021-07-14 DIAGNOSIS — Z3202 Encounter for pregnancy test, result negative: Secondary | ICD-10-CM

## 2021-07-14 DIAGNOSIS — Z8632 Personal history of gestational diabetes: Secondary | ICD-10-CM | POA: Diagnosis not present

## 2021-07-14 DIAGNOSIS — Z30430 Encounter for insertion of intrauterine contraceptive device: Secondary | ICD-10-CM

## 2021-07-14 MED ORDER — LEVONORGESTREL 20.1 MCG/DAY IU IUD
1.0000 | INTRAUTERINE_SYSTEM | Freq: Once | INTRAUTERINE | Status: AC
  Administered 2021-07-14: 1 via INTRAUTERINE

## 2021-07-14 NOTE — Progress Notes (Signed)
    GYNECOLOGY OFFICE PROCEDURE NOTE  [name] is a 35 y.o. [id number] here for liletta IUD insertion. No GYN concerns.  Last pap smear was in 06-2021 and was normal.  IUD Insertion Procedure Note Patient identified, informed consent performed, consent signed.   Discussed risks of irregular bleeding, cramping, infection, malpositioning or misplacement of the IUD outside the uterus which may require further procedure such as laparoscopy. Also discussed >99% contraception efficacy, increased risk of ectopic pregnancy with failure of method.   Emphasized that this did not protect against STIs, condoms recommended during all sexual encounters. Time out was performed.  Urine pregnancy test negative.  Speculum placed in the vagina.  Cervix visualized.  Cleaned with Betadine x 2.  Grasped anteriorly with a single tooth tenaculum.  Uterus sounded to 10 cm.  Liletta IUD placed per manufacturer's recommendations.  Strings trimmed to 3 cm. Tenaculum was removed, good hemostasis noted.  Patient tolerated procedure well.   Patient was given post-procedure instructions.  She was advised to have backup contraception for one week.  Patient was also asked to check IUD strings periodically and follow up in 4 weeks for IUD check.  Vanessa Bernheim, RN, MSN, NP-BC Nurse Practitioner, [facility] for [employer], [facility] 07/14/2021 11:43 AM

## 2021-07-15 LAB — GLUCOSE TOLERANCE, 2 HOURS
Glucose, 2 hour: 153 mg/dL — ABNORMAL HIGH (ref 70–139)
Glucose, GTT - Fasting: 92 mg/dL (ref 70–99)

## 2021-07-16 LAB — POCT PREGNANCY, URINE: Preg Test, Ur: NEGATIVE

## 2021-08-03 DIAGNOSIS — Z419 Encounter for procedure for purposes other than remedying health state, unspecified: Secondary | ICD-10-CM | POA: Diagnosis not present

## 2021-08-19 ENCOUNTER — Ambulatory Visit: Payer: Medicaid Other | Admitting: Nurse Practitioner

## 2021-08-20 ENCOUNTER — Other Ambulatory Visit: Payer: Self-pay | Admitting: Family Medicine

## 2021-08-30 ENCOUNTER — Other Ambulatory Visit: Payer: Self-pay

## 2021-08-30 ENCOUNTER — Ambulatory Visit (INDEPENDENT_AMBULATORY_CARE_PROVIDER_SITE_OTHER): Payer: Medicaid Other | Admitting: Nurse Practitioner

## 2021-08-30 ENCOUNTER — Encounter: Payer: Self-pay | Admitting: Nurse Practitioner

## 2021-08-30 VITALS — BP 122/86 | HR 80 | Wt 180.0 lb

## 2021-08-30 DIAGNOSIS — Z975 Presence of (intrauterine) contraceptive device: Secondary | ICD-10-CM | POA: Diagnosis not present

## 2021-08-30 DIAGNOSIS — N939 Abnormal uterine and vaginal bleeding, unspecified: Secondary | ICD-10-CM | POA: Diagnosis not present

## 2021-08-30 MED ORDER — NORETHINDRONE 0.35 MG PO TABS: 1.0000 | ORAL_TABLET | Freq: Every day | ORAL | 5 refills | Status: DC

## 2021-08-30 NOTE — Progress Notes (Signed)
    GYNECOLOGY OFFICE ENCOUNTER NOTE  History:  35 y.o. [id number] here today for today for IUD string check; Liletta  IUD was placed  07-14-21. No complaints about the IUD, no pain.  Had a heavy period and then this past week had a period that was lighter.  Still between periods is having lots of spotting - hardly ever has a day when she does not bleed and this is concerning for her.  The following portions of the patient's history were reviewed and updated as appropriate: allergies, current medications, past family history, past medical history, past social history, past surgical history and problem list. Last pap smear on 06-30-21 was normal, negative HRHPV.  Review of Systems:  Pertinent items are noted in HPI.   Objective:  Physical Exam Blood pressure 122/86, pulse 80, weight 180 lb (81.6 kg), last menstrual period 08/24/2021, currently breastfeeding. CONSTITUTIONAL: Well-developed, well-nourished female in no acute distress.  HENT:  Normocephalic, atraumatic.  EYES: Conjunctivae and EOM are normal. Pupils are equal, round, and reactive to light. No scleral icterus.  NECK: Normal range of motion, supple CARDIOVASCULAR: Normal heart rate noted RESPIRATORY: Effort and breath sounds normal, no problems with respiration noted ABDOMEN: Soft, no distention noted.   PELVIC: Normal appearing external genitalia; normal appearing vaginal mucosa and cervix.  IUD strings visualized, about 2.5 cm in length outside cervix.   Assessment & Plan:  Patient to keep IUD in place for up to seven years; can come in for removal if she desires pregnancy earlier or for or concerning side effects.  Prescribed Micronor to assist with her everyday spotting.  Had 5 refills. Return in one year for annual exan.  Nolene Bernheim, RN, MSN, NP-BC Nurse Practitioner, [facility] for [employer], [facility] 08/30/2021 3:27 PM

## 2021-09-02 DIAGNOSIS — Z419 Encounter for procedure for purposes other than remedying health state, unspecified: Secondary | ICD-10-CM | POA: Diagnosis not present

## 2021-10-03 DIAGNOSIS — Z419 Encounter for procedure for purposes other than remedying health state, unspecified: Secondary | ICD-10-CM | POA: Diagnosis not present

## 2021-11-03 DIAGNOSIS — Z419 Encounter for procedure for purposes other than remedying health state, unspecified: Secondary | ICD-10-CM | POA: Diagnosis not present

## 2021-12-01 DIAGNOSIS — Z419 Encounter for procedure for purposes other than remedying health state, unspecified: Secondary | ICD-10-CM | POA: Diagnosis not present

## 2022-01-01 DIAGNOSIS — Z419 Encounter for procedure for purposes other than remedying health state, unspecified: Secondary | ICD-10-CM | POA: Diagnosis not present

## 2022-01-05 ENCOUNTER — Encounter: Payer: Self-pay | Admitting: Family Medicine

## 2022-01-05 ENCOUNTER — Other Ambulatory Visit: Payer: Self-pay

## 2022-01-05 ENCOUNTER — Ambulatory Visit (INDEPENDENT_AMBULATORY_CARE_PROVIDER_SITE_OTHER): Payer: Medicaid Other | Admitting: Family Medicine

## 2022-01-05 VITALS — BP 110/68 | HR 70 | Wt 180.0 lb

## 2022-01-05 DIAGNOSIS — R7303 Prediabetes: Secondary | ICD-10-CM

## 2022-01-05 DIAGNOSIS — R5383 Other fatigue: Secondary | ICD-10-CM | POA: Insufficient documentation

## 2022-01-05 LAB — POCT GLYCOSYLATED HEMOGLOBIN (HGB A1C): HbA1c, POC (prediabetic range): 6.3 % (ref 5.7–6.4)

## 2022-01-05 MED ORDER — THERA VITAL M PO TABS: 1.0000 | ORAL_TABLET | Freq: Every day | ORAL | 2 refills | Status: DC

## 2022-01-05 NOTE — Assessment & Plan Note (Signed)
A1c stable.  She relates has stopped sweets.  Recommend regular exercise ?

## 2022-01-05 NOTE — Patient Instructions (Signed)
Good to see you today - Thank you for coming in ? ?Things we discussed today: ? ?Your have Pre-diabetes your A1c is 6.3.  Try regular exercise regularly once fasting is over ? ?I will call you if your tests are not good.  Otherwise, I will send you a message on MyChart (if it is active) or a letter in the mail..  If you do not hear from me with in 2 weeks please call our office.    ? ?Take one multivitamin each day  ? ?Come back if getting worse ? ?

## 2022-01-05 NOTE — Assessment & Plan Note (Signed)
Associated with intermittent headaches and diarrhea complicated by currently fasting for Ramadan.  No signs of CNS lesion or significant gi problems or depression.  Will check cmet and cbc (has history of anemia and is not on iron)  She does not want to try any medications for her headaches.  Recommend regular exercise and multivitamin  ?

## 2022-01-05 NOTE — Progress Notes (Signed)
? ? ?  SUBJECTIVE:  ? ?CHIEF COMPLAINT / HPI:  ? ?Feeling tired with intermittent headaches and diarrhea.  Is currently fasting for Ramadan.  The diarrhea and headaches often happens after she breaks fast but only once a week or so. ?Headache - one side usually R.  No visual changes or nausea and vomiting.  No weakness.  Has not tried medications and does not want to ?Diarrhea - after eating sometiems.  Last 2 weeks ago.  No bleeding  ? ?Anemia ?No taking iron.  Is more tired than usual. No weight loss or fever ? ?PreDiabetes Gestational Diabetes  ?Her blood sugar usually 130s when she checks in AM ? ?PERTINENT  PMH / PSH: Has son born around August 2022  ? ?OBJECTIVE:  ? ?BP 110/68   Pulse 70   Wt 180 lb (81.6 kg)   SpO2 99%   BMI 35.15 kg/m?   ?Psych:  Cognition and judgment appear intact. Alert, communicative  and cooperative with normal attention span and concentration. No apparent delusions, illusions, hallucinations ?Does not want a translator ?Heart - Regular rate and rhythm.  No murmurs, gallops or rubs.    ?Lungs:  Normal respiratory effort, chest expands symmetrically. Lungs are clear to auscultation, no crackles or wheezes. ?Extremities:  No cyanosis, trace edema bilaterally , no deformity noted with good range of motion of all major joints.    ? ?ASSESSMENT/PLAN:  ? ?Prediabetes ?A1c stable.  She relates has stopped sweets.  Recommend regular exercise ? ?Fatigue ?Associated with intermittent headaches and diarrhea complicated by currently fasting for Ramadan.  No signs of CNS lesion or significant gi problems or depression.  Will check cmet and cbc (has history of anemia and is not on iron)  She does not want to try any medications for her headaches.  Recommend regular exercise and multivitamin  ?  ? ? ?Carney Living, MD ?[facility]  ?

## 2022-01-06 LAB — CBC
Hematocrit: 37 % (ref 34.0–46.6)
Hemoglobin: 11.4 g/dL (ref 11.1–15.9)
MCH: 22.1 pg — ABNORMAL LOW (ref 26.6–33.0)
MCHC: 30.8 g/dL — ABNORMAL LOW (ref 31.5–35.7)
MCV: 72 fL — ABNORMAL LOW (ref 79–97)
Platelets: 339 10*3/uL (ref 150–450)
RBC: 5.17 x10E6/uL (ref 3.77–5.28)
RDW: 16.3 % — ABNORMAL HIGH (ref 11.7–15.4)
WBC: 5.5 10*3/uL (ref 3.4–10.8)

## 2022-01-06 LAB — CMP14+EGFR
ALT: 17 IU/L (ref 0–32)
AST: 14 IU/L (ref 0–40)
Albumin/Globulin Ratio: 1.6 (ref 1.2–2.2)
Albumin: 4.4 g/dL (ref 3.8–4.8)
Alkaline Phosphatase: 87 IU/L (ref 44–121)
BUN/Creatinine Ratio: 12 (ref 9–23)
BUN: 8 mg/dL (ref 6–20)
Bilirubin Total: <0.2 mg/dL (ref 0.0–1.2)
CO2: 19 mmol/L — ABNORMAL LOW (ref 20–29)
Calcium: 9.7 mg/dL (ref 8.7–10.2)
Chloride: 106 mmol/L (ref 96–106)
Creatinine, Ser: 0.69 mg/dL (ref 0.57–1.00)
Globulin, Total: 2.7 g/dL (ref 1.5–4.5)
Glucose: 123 mg/dL — ABNORMAL HIGH (ref 70–99)
Potassium: 4.8 mmol/L (ref 3.5–5.2)
Sodium: 141 mmol/L (ref 134–144)
Total Protein: 7.1 g/dL (ref 6.0–8.5)
eGFR: 114 mL/min/{1.73_m2} (ref >59)

## 2022-01-31 DIAGNOSIS — Z419 Encounter for procedure for purposes other than remedying health state, unspecified: Secondary | ICD-10-CM | POA: Diagnosis not present

## 2022-03-03 DIAGNOSIS — Z419 Encounter for procedure for purposes other than remedying health state, unspecified: Secondary | ICD-10-CM | POA: Diagnosis not present

## 2022-03-22 ENCOUNTER — Ambulatory Visit (HOSPITAL_COMMUNITY)
Admission: EM | Admit: 2022-03-22 | Discharge: 2022-03-22 | Disposition: A | Discharge status: Home / Self Care | Payer: Medicaid Other | Attending: Internal Medicine | Admitting: Internal Medicine

## 2022-03-22 ENCOUNTER — Other Ambulatory Visit: Payer: Self-pay

## 2022-03-22 ENCOUNTER — Encounter (HOSPITAL_COMMUNITY): Payer: Self-pay | Admitting: *Deleted

## 2022-03-22 DIAGNOSIS — K529 Noninfective gastroenteritis and colitis, unspecified: Secondary | ICD-10-CM | POA: Diagnosis not present

## 2022-03-22 DIAGNOSIS — R739 Hyperglycemia, unspecified: Secondary | ICD-10-CM | POA: Diagnosis not present

## 2022-03-22 DIAGNOSIS — R519 Headache, unspecified: Secondary | ICD-10-CM | POA: Insufficient documentation

## 2022-03-22 DIAGNOSIS — E860 Dehydration: Secondary | ICD-10-CM | POA: Insufficient documentation

## 2022-03-22 LAB — POCT URINALYSIS DIPSTICK, ED / UC
Glucose, UA: NEGATIVE mg/dL
Ketones, ur: 80 mg/dL — AB
Leukocytes,Ua: NEGATIVE
Nitrite: NEGATIVE
Protein, ur: 100 mg/dL — AB
Specific Gravity, Urine: 1.025 (ref 1.005–1.030)
Urobilinogen, UA: 0.2 mg/dL (ref 0.0–1.0)
pH: 5.5 (ref 5.0–8.0)

## 2022-03-22 LAB — CBG MONITORING, ED: Glucose-Capillary: 125 mg/dL — ABNORMAL HIGH (ref 70–99)

## 2022-03-22 LAB — POC URINE PREG, ED: Preg Test, Ur: NEGATIVE

## 2022-03-22 MED ORDER — IBUPROFEN 600 MG PO TABS: 600.0000 mg | ORAL_TABLET | Freq: Four times a day (QID) | ORAL | 0 refills | Status: DC | PRN

## 2022-03-22 MED ORDER — ONDANSETRON 4 MG PO TBDP: 4.0000 mg | ORAL_TABLET | Freq: Three times a day (TID) | ORAL | 0 refills | Status: DC | PRN

## 2022-03-22 MED ORDER — ACETAMINOPHEN 325 MG PO TABS
ORAL_TABLET | ORAL | Status: AC
  Filled 2022-03-22: qty 3

## 2022-03-22 MED ORDER — ACETAMINOPHEN 500 MG PO TABS: 1000.0000 mg | ORAL_TABLET | Freq: Four times a day (QID) | ORAL | 0 refills | Status: DC | PRN

## 2022-03-22 MED ORDER — ACETAMINOPHEN 325 MG PO TABS
975.0000 mg | ORAL_TABLET | Freq: Once | ORAL | Status: AC
  Administered 2022-03-22: 975 mg via ORAL

## 2022-03-22 NOTE — Discharge Instructions (Addendum)
???? ???? ?? ?????? ??????. ????? ?????????? ???????? ??   6 ????? ??? ?????? ???? ?????. ?? ?????? ?? 8 ????? ??? ?????? ??????? ??????. ???? ?????? ??????? ?????? ???? 12 ??? 24 ???? ??????? ??? ?? ??? ????? ?????? ????? ?????? ?????? ?????? ?? ??????? ??? ?????? ???? ???? ????? ?????? ?????? ???????. ??? ????? ??????? ?? ??????? ?? ??????? ???? ??????? ? ????? ?????? ??? ??????? ??????? ????? ?? ???????. ??? ????? ??? ????? ??? ???? ? ???? ?????? ?????? ???????. ???? ?? ?? ??? ?? 64 ????? ?? ????? ?????? ?????? ????? ?????. ???? ??? ????????? ??????? ??? ??? ?? ???? ??? ??? ????? ??????. ??? ????? ????? ?? ??? ??????? ?????? ?? ???????. ??? ?????? ???????? ?? ???? ??????? ??????? ????? ?? ????? ?? ??????? ???? ?????? ???????. ladayk eadwaa fi aljihaz alhadmii. tanawal aybubrufin watilinul kula 6 saeat hasab alhajat li'alam albatni. khudh zufran kula 8 saeat hasab alhajat lilghathayan walqay'i. aitabie nzaman ghdhayyan khfyfan limudat 12 'iilaa 24 saeat alqadimat bima fi dhalik al'uruz walkhubz waeasir altufaah walmawz waghayriha min al'ateimat ghayr alharat alati yashul hadmuha lilsamah limaedatik bialraahati. 'iidha aistamara al'iishal fi alyawmayn 'aw althalathat 'ayaam alqadimat , fayurjaa aleawdat 'iilaa alrieayat aleajilat limazid min altaqyimi. 'iidhan tafaqam 'alam albatn walam yukhtaf , yurjaa aleawdat li'iieadat altaqyimi. aishrab ma la yaqilu ean 64 'uwnisat min alma' ywmyan lilbaqa' rtban jydan. tajanub shurb almashrubat alsukariat li'ana hadha Young Berry 'iilaa 'iilaa ziadat aljafafi. kan mustawaa alsukar fi damik mrtfean qlylaan fi aleiadati. hadad mwedan lilmutabaeat mae muqadam alrieayat al'awaliat alkhasi bik limazid min altaqyim limarad alsukari almuhtamali.  ??? ???? ???? ?? ????? ????? ?? ?????? ?? ?? ????? ?? ??????? ?? ??????? ???? ??????? ? ???? ??????. ??? ???? ?????? ????? ? ???? ?????? ??? ???? ???????. ???? ?? ???? ??????? ??????? ????? ?? ????? ?? ??????? ?????? ??????? ?????? ??  ???????? ??? ?????? ??????? ????????. ????? ?? ???? ?????! 'iidha zaharat ealayk 'ayu 'aerad jadidat 'aw tafaqamat 'aw lam tatahasan fi alyawmayn 'aw althalathat 'ayaam alqadimat , yurjaa aleawdatu. 'iidha kanat 'aeraduk shadidat , Ellouise Newer 'iilaa ghurfat altawarii. Gabrielle Dare mae maqadam alrieayat al'awaliat alkhasi bik limazid min altaqyim wa'iidarat al'aerad alkhasat bik bial'iidafat 'iilaa ziarat aleafiat almustamirati. 'atamanaa 'an tasheur bitahasuni!  You have a gastrointestinal infection.   Take ibuprofen and Tylenol every 6 hours as needed for abdominal pain. Take Zofran every 8 hours as needed for nausea and vomiting. Eat a bland diet for the next 12 to 24 hours including rice, bread, applesauce, bananas, and other nonspicy foods that are easy to digest to allow your stomach to rest. If you continue to have diarrhea for the next 2 to 3 days, please return to urgent care for further evaluation.  If your abdominal pain becomes worse and does not go away, please return for reevaluation.  Drink at least 64 ounces of water per day to stay well-hydrated.  Avoid drinking sugary drinks as this will only further dehydrate you. Your blood sugar was slightly elevated in the clinic.  Schedule a follow-up appointment with your primary care provider for further evaluation of possible diabetes.  If you develop any new or worsening symptoms or do not improve in the next 2 to 3 days, please return.  If your symptoms are severe, please go to the emergency room.  Follow-up with your primary care provider for further evaluation and management of your symptoms as well as ongoing wellness visits.  I hope you feel better!

## 2022-03-22 NOTE — ED Provider Notes (Signed)
[facility]    CSN: [id number] Arrival date & time: 03/22/22  1040      History   Chief Complaint Chief Complaint  Patient presents with   Headache   Fever   Flank Pain    HPI [name] is a 36 y.o. female.   Patient presents to urgent care for evaluation of headache, fever, nausea, vomiting, and diarrhea for the last 3 days since March 19, 2022.  She does not have a thermometer at home, but states that she has felt very cold even though she has been under a few blankets at home.  Headache is currently a 6 on a scale 0-10 and mostly localized to the occiput, but does radiate to the right-sided area of her neck.  No known sick contacts.  She has not been camping lately or not water from an unknown source.  She has not eaten any foods outside of her normal diet.  She vomited once last night, but is tolerating clear liquids at this time and denies nausea.  She had 1 episode of diarrhea this morning.  Heart palpitations, chest pain, shortness of breath, dizziness, sore throat, eye pain, and visual abnormality.  She has been taking ibuprofen 400 mg at home intermittently.  Last dose of ibuprofen was last night at 10 PM.  This helped her symptoms.  She is also complaining of some right lower abdominal pain starting 3 days ago as well.  Abdominal pain is currently a 5 on a scale of 0-10 and improves after ibuprofen.  She has an IUD and denies chance of being pregnant.  No vaginal discharge, vaginal odor, or itch.  No urinary frequency, urgency, or dysuria.  No other aggravating or relieving factors for symptoms identified at this time.   Headache Associated symptoms: fever   Fever Associated symptoms: headaches   Flank Pain Associated symptoms include headaches.    Past Medical History:  Diagnosis Date   Anemia    Gestational diabetes 12/01/2020    Patient Active Problem List   Diagnosis Date Noted   Fatigue 01/05/2022   Presence of IUD 08/30/2021   History of 3 cesarean  sections 04/26/2021   Prediabetes 02/18/2020   Hypopigmentation 08/29/2018   Female genital mutilation with clitorectomy 01/07/2016   Seasonal allergies 12/08/2015   Anemia affecting pregnancy 09/03/2014   Language barrier 08/04/2014    Past Surgical History:  Procedure Laterality Date   CESAREAN SECTION  2009; 2011; 2016   x 2 in [place]   CESAREAN SECTION N/A 12/22/2014   Procedure: CESAREAN SECTION;  Surgeon: Tereso Newcomer, MD;  Location: [facility];  Service: Obstetrics;  Laterality: N/A;   CESAREAN SECTION N/A 04/29/2021   Procedure: CESAREAN SECTION;  Surgeon: Warden Fillers, MD;  Location: [facility];  Service: Obstetrics;  Laterality: N/A;   CHOLECYSTECTOMY N/A 01/31/2017   Procedure: LAPAROSCOPIC CHOLECYSTECTOMY WITH INTRAOPERATIVE CHOLANGIOGRAM; REPAIR OF UMBILICAL HERNIA;  Surgeon: Manus Rudd, MD;  Location: [facility];  Service: General;  Laterality: N/A;   CHOLECYSTECTOMY     ESOPHAGOGASTRODUODENOSCOPY N/A 01/29/2017   Procedure: ESOPHAGOGASTRODUODENOSCOPY (EGD);  Surgeon: Jeani Hawking, MD;  [facility] END[facility]COPY;  Service: Endoscopy;  Laterality: N/A;   Female Circumcision      OB History     Gravida  5   Para  4   Term  4   Preterm  0   AB  1   Living  4      SAB  1   IAB  0   Ectopic  0   Multiple  0   Live Births  4        Obstetric Comments  In [place]          Home Medications    Prior to Admission medications   Medication Sig Start Date End Date Taking? Authorizing Provider  acetaminophen (TYLENOL) 500 MG tablet Take 2 tablets (1,000 mg total) by mouth every 6 (six) hours as needed. 03/22/22  Yes Carlisle Beers, FNP  ibuprofen (ADVIL) 600 MG tablet Take 1 tablet (600 mg total) by mouth every 6 (six) hours as needed. 03/22/22  Yes Carlisle Beers, FNP  ondansetron (ZOFRAN-ODT) 4 MG disintegrating tablet Take 1 tablet (4 mg total) by mouth every 8 (eight) hours as needed for nausea or vomiting. 03/22/22  Yes Carlisle Beers, FNP  Multiple Vitamins-Minerals (MULTIVITAMIN) tablet Take 1 tablet by mouth daily. 01/05/22   Carney Living, MD    Family History Family History  Problem Relation Age of Onset   Hypertension Mother     Social History Social History   Tobacco Use   Smoking status: Never   Smokeless tobacco: Never  Vaping Use   Vaping Use: Never used  Substance Use Topics   Alcohol use: No    Alcohol/week: 0.0 standard drinks of alcohol   Drug use: No     Allergies   Patient has no known allergies.   Review of Systems Review of Systems  Constitutional:  Positive for fever.  Genitourinary:  Positive for flank pain.  Neurological:  Positive for headaches.  Per HPI   Physical Exam Triage Vital Signs ED Triage Vitals  Enc Vitals Group     BP 03/22/22 1144 116/87     Pulse Rate 03/22/22 1144 100     Resp 03/22/22 1144 18     Temp 03/22/22 1144 99.1 F (37.3 C)     Temp src --      SpO2 03/22/22 1144 98 %     Weight --      Height --      Head Circumference --      Peak Flow --      Pain Score 03/22/22 1142 5     Pain Loc --      Pain Edu? --      Excl. in GC? --    No data found.  Updated Vital Signs BP 116/87   Pulse 100   Temp 99.1 F (37.3 C)   Resp 18   SpO2 98%   Visual Acuity Right Eye Distance:   Left Eye Distance:   Bilateral Distance:    Right Eye Near:   Left Eye Near:    Bilateral Near:     Physical Exam Vitals and nursing note reviewed.  Constitutional:      Appearance: Normal appearance. She is not ill-appearing or toxic-appearing.     Comments: Very pleasant patient sitting on exam in position of comfort table in no acute distress.   HENT:     Head: Normocephalic and atraumatic.     Right Ear: Hearing, tympanic membrane, ear canal and external ear normal.     Left Ear: Hearing, tympanic membrane, ear canal and external ear normal.     Nose: Nose normal.     Mouth/Throat:     Lips: Pink.     Mouth: Mucous membranes are moist.      Pharynx: Oropharynx is clear. Uvula midline. No posterior oropharyngeal erythema.  Eyes:     General: Lids are normal. Vision grossly intact. Gaze aligned appropriately.     Extraocular Movements: Extraocular movements intact.     Conjunctiva/sclera: Conjunctivae normal.  Cardiovascular:     Rate and Rhythm: Normal rate and regular rhythm.     Heart sounds: Normal heart sounds, S1 normal and S2 normal.  Pulmonary:     Effort: Pulmonary effort is normal. No respiratory distress.     Breath sounds: Normal breath sounds and air entry.  Abdominal:     General: Abdomen is flat. Bowel sounds are normal.     Palpations: Abdomen is soft.     Tenderness: There is abdominal tenderness in the right lower quadrant. There is no right CVA tenderness, left CVA tenderness, guarding or rebound. Negative signs include Murphy's sign, Rovsing's sign and McBurney's sign.     Comments: Patient is mildly tender to use her right lower quadrant abdomen.  There is no guarding or rebound tenderness present.   Musculoskeletal:     Cervical back: Neck supple.  Skin:    General: Skin is warm and dry.     Capillary Refill: Capillary refill takes less than 2 seconds.     Findings: No rash.     Comments: No rash.  No skin tenting.  Skin turgor normal.  Neurological:     General: No focal deficit present.     Mental Status: She is alert and oriented to person, place, and time. Mental status is at baseline.     Cranial Nerves: Cranial nerves 2-12 are intact. No dysarthria or facial asymmetry.     Sensory: Sensation is intact.     Motor: Motor function is intact.     Coordination: Coordination is intact.     Gait: Gait is intact.     Comments: 5/5 grip strength to bilateral upper extremities.  5/5 strength against resistance with abduction and abduction of bilateral upper and lower extremities.  No focal neurodeficits identified to physical exam.  Psychiatric:        Mood and Affect: Mood normal.        Speech: Speech  normal.        Behavior: Behavior normal.        Thought Content: Thought content normal.        Judgment: Judgment normal.     UC Treatments / Results  Labs (all labs ordered are listed, but only abnormal results are displayed) Labs Reviewed  POCT URINALYSIS DIPSTICK, ED / UC - Abnormal; Notable for the following components:      Result Value   Bilirubin Urine SMALL (*)    Ketones, ur 80 (*)    Hgb urine dipstick MODERATE (*)    Protein, ur 100 (*)    All other components within normal limits  CBG MONITORING, ED - Abnormal; Notable for the following components:   Glucose-Capillary 125 (*)    All other components within normal limits  URINE CULTURE  POC URINE PREG, ED    EKG   Radiology No results found.  Procedures Procedures (including critical care time)  Medications Ordered in UC Medications  acetaminophen (TYLENOL) tablet 975 mg (975 mg Oral Given 03/22/22 1249)    Initial Impression / Assessment and Plan / UC Course  I have reviewed the triage vital signs and the nursing notes.  Pertinent labs & imaging results that were available during my care of the patient were reviewed by me and considered in my medical decision making (see chart for  details).  1.  Acute viral gastroenteritis Symptomology and physical exam are consistent with acute viral gastroenteritis.  No peritoneal signs elicited to abdominal exam.  No clinical indication for advanced imaging or further emergent evaluation at this time.  Plan to treat with supportive care prescriptions with Zofran 4 mg to be taken every 8 hours as needed for nausea and vomiting.  Encourage patient to increase her water intake to at least 64 ounces of water per day as tolerated to prevent dehydration.  She appears mildly dehydrated to physical exam at this time.  She is to eat a bland diet for the next 12 to 24 hours including bananas, rice, toast and applesauce to allow her stomach to heal hopefully reduce diarrhea.  If she  continues to not be able to tolerate liquids without vomiting or continues to have persistent diarrhea over the next 3-4 days, she is to follow-up at urgent care or PCP for further evaluation.  If symptoms are severe, she is to go to the emergency department.  2.  Elevated blood glucose level Patient has a history of prediabetes with last hemoglobin A1c being 6.3 approximately 2 months ago.  She follows with her PCP for evaluation of this.  Patient reports increased thirst and frequency of urination recently.  Urinalysis shows ketonuria, proteinuria, and hematuria.  Urine culture pending.  CBG in clinic is 125.  Recommend patient follow-up with PCP in the next month for repeat hemoglobin A1c to ensure that she is still considered prediabetic and not in the type 2 diabetes range.   3.  Acute headache Headache is likely related to mild dehydration due to viral gastroenteritis.  Neurologic exam is stable and without focal abnormality.  Patient may take ibuprofen 600 mg every 6 hours and Tylenol 1000 mg every 6 hours as needed for headache.  Patient given Tylenol 975 mg in the clinic for head pain with improvement.  Counseled patient regarding appropriate use of medications and potential side effects for all medications recommended or prescribed today. Discussed red flag signs and symptoms of worsening condition,when to call the PCP office, return to urgent care, and when to seek higher level of care. Patient verbalizes understanding and agreement with plan. All questions answered. Patient discharged in stable condition.  Final Clinical Impressions(s) / UC Diagnoses   Final diagnoses:  Acute nonintractable headache, unspecified headache type  Dehydration  Gastroenteritis     Discharge Instructions      ???? ???? ?? ?????? ??????. ????? ?????????? ???????? ?? 6 ????? ??? ?????? ???? ?????. ?? ?????? ?? 8 ????? ??? ?????? ??????? ??????. ???? ?????? ??????? ?????? ???? 12 ??? 24 ???? ??????? ??? ??  ??? ????? ?????? ????? ?????? ?????? ?????? ?? ??????? ??? ?????? ???? ???? ????? ?????? ?????? ???????. ??? ????? ??????? ?? ??????? ?? ??????? ???? ??????? ? ????? ?????? ??? ??????? ??????? ????? ?? ???????. ??? ????? ??? ????? ??? ???? ? ???? ?????? ?????? ???????. ???? ?? ?? ??? ?? 64 ????? ?? ????? ?????? ?????? ????? ?????. ???? ??? ????????? ??????? ??? ??? ?? ???? ??? ??? ????? ??????. ??? ????? ????? ?? ??? ??????? ?????? ?? ???????. ??? ?????? ???????? ?? ???? ??????? ??????? ????? ?? ????? ?? ??????? ???? ?????? ???????. ladayk eadwaa fi aljihaz alhadmii. tanawal aybubrufin watilinul kula 6 saeat hasab alhajat li'alam albatni. khudh zufran kula 8 saeat hasab alhajat lilghathayan walqay'i. aitabie nzaman ghdhayyan khfyfan limudat 12 'iilaa 24 saeat alqadimat bima fi dhalik al'uruz walkhubz waeasir altufaah walmawz waghayriha min al'ateimat ghayr alharat alati  yashul hadmuha lilsamah limaedatik bialraahati. 'iidha aistamara al'iishal fi alyawmayn 'aw althalathat 'ayaam alqadimat , fayurjaa aleawdat 'iilaa alrieayat aleajilat limazid min altaqyimi. 'iidhan tafaqam 'alam albatn walam yukhtaf , yurjaa aleawdat li'iieadat altaqyimi. aishrab ma la yaqilu ean 64 'uwnisat min alma' ywmyan lilbaqa' rtban jydan. tajanub shurb almashrubat alsukariat li'ana hadha Young Berry 'iilaa 'iilaa ziadat aljafafi. kan mustawaa alsukar fi damik mrtfean qlylaan fi aleiadati. hadad mwedan lilmutabaeat mae muqadam alrieayat al'awaliat alkhasi bik limazid min altaqyim limarad alsukari almuhtamali.  ??? ???? ???? ?? ????? ????? ?? ?????? ?? ?? ????? ?? ??????? ?? ??????? ???? ??????? ? ???? ??????. ??? ???? ?????? ????? ? ???? ?????? ??? ???? ???????. ???? ?? ???? ??????? ??????? ????? ?? ????? ?? ??????? ?????? ??????? ?????? ?? ???????? ??? ?????? ??????? ????????. ????? ?? ???? ?????! 'iidha zaharat ealayk 'ayu 'aerad jadidat 'aw tafaqamat 'aw lam tatahasan fi alyawmayn 'aw althalathat 'ayaam alqadimat , yurjaa aleawdatu.  'iidha kanat 'aeraduk shadidat , Ellouise Newer 'iilaa ghurfat altawarii. Gabrielle Dare mae maqadam alrieayat al'awaliat alkhasi bik limazid min altaqyim wa'iidarat al'aerad alkhasat bik bial'iidafat 'iilaa ziarat aleafiat almustamirati. 'atamanaa 'an tasheur bitahasuni!  You have a gastrointestinal infection.   Take ibuprofen and Tylenol every 6 hours as needed for abdominal pain. Take Zofran every 8 hours as needed for nausea and vomiting. Eat a bland diet for the next 12 to 24 hours including rice, bread, applesauce, bananas, and other nonspicy foods that are easy to digest to allow your stomach to rest. If you continue to have diarrhea for the next 2 to 3 days, please return to urgent care for further evaluation.  If your abdominal pain becomes worse and does not go away, please return for reevaluation.  Drink at least 64 ounces of water per day to stay well-hydrated.  Avoid drinking sugary drinks as this will only further dehydrate you. Your blood sugar was slightly elevated in the clinic.  Schedule a follow-up appointment with your primary care provider for further evaluation of possible diabetes.  If you develop any new or worsening symptoms or do not improve in the next 2 to 3 days, please return.  If your symptoms are severe, please go to the emergency room.  Follow-up with your primary care provider for further evaluation and management of your symptoms as well as ongoing wellness visits.  I hope you feel better!     ED Prescriptions     Medication Sig Dispense Auth. Provider   ondansetron (ZOFRAN-ODT) 4 MG disintegrating tablet Take 1 tablet (4 mg total) by mouth every 8 (eight) hours as needed for nausea or vomiting. 20 tablet Reita May M, FNP   ibuprofen (ADVIL) 600 MG tablet Take 1 tablet (600 mg total) by mouth every 6 (six) hours as needed. 30 tablet Reita May M, FNP   acetaminophen (TYLENOL) 500 MG tablet Take 2 tablets (1,000 mg total) by mouth every 6 (six) hours  as needed. 30 tablet Carlisle Beers, FNP      PDMP not reviewed this encounter.   Carlisle Beers, [place] 03/24/22 1331

## 2022-03-22 NOTE — ED Triage Notes (Signed)
PT reports on Sat she strted to have fever ,HA and Rt flank pain.

## 2022-03-23 ENCOUNTER — Ambulatory Visit (INDEPENDENT_AMBULATORY_CARE_PROVIDER_SITE_OTHER): Payer: Medicaid Other | Admitting: Student

## 2022-03-23 ENCOUNTER — Encounter: Payer: Self-pay | Admitting: Student

## 2022-03-23 VITALS — BP 106/74 | HR 99 | Temp 98.3°F | Wt 175.2 lb

## 2022-03-23 DIAGNOSIS — A084 Viral intestinal infection, unspecified: Secondary | ICD-10-CM | POA: Insufficient documentation

## 2022-03-23 LAB — URINE CULTURE: Culture: NO GROWTH

## 2022-03-23 NOTE — Patient Instructions (Signed)
It was great to see you! Thank you for allowing me to participate in your care!   Our plans for today:  - Continue taking 400 mg ibuprofen every 6 hours as needed for pain - Drink plenty of water and eat small amounts as you are able  Return if your symptoms persist or worsen over the next 10 days   Take care and seek immediate care sooner if you develop any concerns.   Dr. Erick Alley, DO [facility]

## 2022-03-23 NOTE — Progress Notes (Cosign Needed)
    SUBJECTIVE:   CHIEF COMPLAINT / HPI:   Was seen at urgent care yesterday for same symptoms of headache, fever, nausea, vomiting and diarrhea for past 3 days. They diagnosed her with viral gastroenteritis and also recommended to follow-up with her PCP due to her blood sugar being elevated, however this is not surprising as patient has diagnosis of prediabetes (hemoglobin A1c elevated at visit in April). Today she states she has not had any diarrhea or vomiting since she went to urgent care and abdominal pain she was previously having has resolved. She does state she is still having a right sided pressure like headache, sore throat and a bad smell in her nose.  Took ibuprofen 600 mg about an hour ago. Ibuprofen helps with the headache but wears off after 4 hours.  She is eating a little bit at a time and has been drinking a lot of water. Since being sick she states she has been very thirsty and constantly drinking water.   PERTINENT  PMH / PSH: Prediabetes  OBJECTIVE:   BP 106/74   Pulse 99   Temp 98.3 F (36.8 C)   Wt 175 lb 3.2 oz (79.5 kg)   SpO2 99%   BMI 34.22 kg/m    General: NAD, pleasant, able to participate in exam HEENT: White sclera, clear conjunctiva, bilateral TMs pearly gray with cone of light, MMM, no erythema or exudate of nasopharynx, no pain with palpation of maxillary or frontal sinuses Cardiac: RRR, no murmurs. Respiratory: CTAB, normal effort, No wheezes, rales or rhonchi Abdomen: Bowel sounds present, nontender, nondistended, soft Skin: warm and dry Neuro: alert, no obvious focal deficits Psych: Normal affect and mood  ASSESSMENT/PLAN:   Viral gastroenteritis I do think symptoms are consistent with a viral gastroenteritis.  She is on day 5 of illness and diarrhea and vomiting have resolved since yesterday.  Advised patient that the headache and sore throat could linger for up to a total of 2 weeks. Patient was advised to continue supportive care including  ibuprofen 40 mg every 6 hours as needed, continuing to drink plenty of water, rest, and eat small meals that she is able.  She was also advised to return if symptoms worsen or do not improve within 10 days.     Dr. Erick Alley, DO [facility]

## 2022-03-23 NOTE — Assessment & Plan Note (Signed)
I do think symptoms are consistent with a viral gastroenteritis.  She is on day 5 of illness and diarrhea and vomiting have resolved since yesterday.  Advised patient that the headache and sore throat could linger for up to a total of 2 weeks. Patient was advised to continue supportive care including ibuprofen 40 mg every 6 hours as needed, continuing to drink plenty of water, rest, and eat small meals that she is able.  She was also advised to return if symptoms worsen or do not improve within 10 days.

## 2022-04-02 DIAGNOSIS — Z419 Encounter for procedure for purposes other than remedying health state, unspecified: Secondary | ICD-10-CM | POA: Diagnosis not present

## 2022-05-03 ENCOUNTER — Encounter: Payer: Self-pay | Admitting: Family Medicine

## 2022-05-03 ENCOUNTER — Ambulatory Visit (INDEPENDENT_AMBULATORY_CARE_PROVIDER_SITE_OTHER): Payer: Medicaid Other | Admitting: Family Medicine

## 2022-05-03 ENCOUNTER — Other Ambulatory Visit: Payer: Self-pay

## 2022-05-03 VITALS — BP 119/88 | HR 95 | Wt 170.6 lb

## 2022-05-03 DIAGNOSIS — R7303 Prediabetes: Secondary | ICD-10-CM

## 2022-05-03 DIAGNOSIS — R5383 Other fatigue: Secondary | ICD-10-CM | POA: Diagnosis not present

## 2022-05-03 DIAGNOSIS — Z419 Encounter for procedure for purposes other than remedying health state, unspecified: Secondary | ICD-10-CM | POA: Diagnosis not present

## 2022-05-03 DIAGNOSIS — Z862 Personal history of diseases of the blood and blood-forming organs and certain disorders involving the immune mechanism: Secondary | ICD-10-CM

## 2022-05-03 LAB — POCT GLYCOSYLATED HEMOGLOBIN (HGB A1C): HbA1c, POC (prediabetic range): 5.9 % (ref 5.7–6.4)

## 2022-05-03 NOTE — Patient Instructions (Addendum)
Good to see you today - Thank you for coming in  Things we discussed today:  For Tiredness and Bowels - will check blood count and let you know - Try Fibercon tablets or generic  One tablet a day for one week if not better take 2 tabs a day Try nasal saline or gargle for the throat   Your diabetes is doing very well  Continue to walk every day   Come back in 6 months for a diabetes check

## 2022-05-03 NOTE — Assessment & Plan Note (Signed)
Could be post viral.  Will check cbc.  Try fiber for gi symptoms.   Recent blood work in April was normal.

## 2022-05-03 NOTE — Progress Notes (Signed)
    SUBJECTIVE:   CHIEF COMPLAINT / HPI:   Sick Since her viral infection in June has been feeling tired and ill with intermittent constipation and diarrhea without bleeding.  No vomiting or fever.  Watches her carbohydrate intake closely with history of prediabetes.  Also mild dry sore throat for several weeks.   Unclear whether her fatigue from April resolved completely but she seems to have had worsening starting in June  PERTINENT  PMH / PSH: Anemia Prediabetes CBC and CMET in April 2023  OBJECTIVE:   BP 119/88   Pulse 95   Wt 170 lb 9.6 oz (77.4 kg)   SpO2 99%   BMI 33.32 kg/m   Alert healthy appearing Heart - Regular rate and rhythm.  No murmurs, gallops or rubs.    Lungs:  Normal respiratory effort, chest expands symmetrically. Lungs are clear to auscultation, no crackles or wheezes. Abdomen: soft and non-tender without masses, organomegaly or hernias noted.  No guarding or rebound Extremities:  No cyanosis, edema, or deformity noted with good range of motion of all major joints.   Throat: normal mucosa, no exudate, uvula midline, no redness   ASSESSMENT/PLAN:   History of anemia Could be recurrence of anemia as has irregular menstrual periods with her IUD. Check cbc and ferritin   Prediabetes Well controlled.  Encouraged continued exercise and diet   Fatigue Could be post viral.  Will check cbc.  Try fiber for gi symptoms.   Recent blood work in April was normal.       Carney Living, MD [facility]

## 2022-05-03 NOTE — Assessment & Plan Note (Signed)
Well controlled.  Encouraged continued exercise and diet

## 2022-05-03 NOTE — Assessment & Plan Note (Signed)
Could be recurrence of anemia as has irregular menstrual periods with her IUD. Check cbc and ferritin

## 2022-05-04 LAB — FERRITIN: Ferritin: 30 ng/mL (ref 15–150)

## 2022-05-04 LAB — CBC
Hematocrit: 38.5 % (ref 34.0–46.6)
Hemoglobin: 12.1 g/dL (ref 11.1–15.9)
MCH: 23.1 pg — ABNORMAL LOW (ref 26.6–33.0)
MCHC: 31.4 g/dL — ABNORMAL LOW (ref 31.5–35.7)
MCV: 74 fL — ABNORMAL LOW (ref 79–97)
Platelets: 318 10*3/uL (ref 150–450)
RBC: 5.23 x10E6/uL (ref 3.77–5.28)
RDW: 16.3 % — ABNORMAL HIGH (ref 11.7–15.4)
WBC: 4.8 10*3/uL (ref 3.4–10.8)

## 2022-05-21 ENCOUNTER — Encounter (HOSPITAL_COMMUNITY): Payer: Self-pay

## 2022-05-21 ENCOUNTER — Ambulatory Visit (HOSPITAL_COMMUNITY)
Admission: EM | Admit: 2022-05-21 | Discharge: 2022-05-21 | Disposition: A | Discharge status: Home / Self Care | Payer: Medicaid Other | Attending: Emergency Medicine | Admitting: Emergency Medicine

## 2022-05-21 DIAGNOSIS — H9201 Otalgia, right ear: Secondary | ICD-10-CM

## 2022-05-21 DIAGNOSIS — J029 Acute pharyngitis, unspecified: Secondary | ICD-10-CM

## 2022-05-21 DIAGNOSIS — Z20822 Contact with and (suspected) exposure to covid-19: Secondary | ICD-10-CM | POA: Insufficient documentation

## 2022-05-21 LAB — SARS CORONAVIRUS 2 BY RT PCR: SARS Coronavirus 2 by RT PCR: NEGATIVE

## 2022-05-21 LAB — POC INFLUENZA A AND B ANTIGEN (URGENT CARE ONLY)
INFLUENZA A ANTIGEN, POC: NEGATIVE
INFLUENZA B ANTIGEN, POC: NEGATIVE

## 2022-05-21 LAB — POCT RAPID STREP A, ED / UC: Streptococcus, Group A Screen (Direct): NEGATIVE

## 2022-05-21 NOTE — Discharge Instructions (Signed)
Your symptoms and physical exam findings are concerning for a viral respiratory infection.     Your rapid influenza test today was negative.  Due to the duration of your symptoms, you would no longer benefit from antiviral therapy for influenza.     You were tested for COVID-19 today.  The result of your COVID-19 test will be posted to your MyChart once it is complete, typically this takes 6 to 12 hours.  Because you are under the age of 72 and have a low risk of being hospitalized for symptoms of COVID-19, you would not benefit from antiviral treatment for COVID-19.   Your strep test today is negative.  Streptococcal throat culture will be performed per our protocol.  The result of your throat culture will be posted to your MyChart once it is complete, this typically takes 3 to 5 days.  If your streptococcal throat culture is positive, you will be contacted by phone and antibiotics will be prescribed for you.     Please see the list below for recommended medications, dosages and frequencies to provide relief of your current symptoms:    Advil, Motrin (ibuprofen): This is a good anti-inflammatory medication which addresses aches, pains and inflammation of the upper airways that causes sinus and nasal congestion as well as in the lower airways which makes your cough feel tight and sometimes burn.  I recommend that you take between 400 to 600 mg every 6-8 hours as needed.      Tylenol (acetaminophen): This is a good fever reducer.  If your body temperature rises above 101.5 as measured with a thermometer, it is recommended that you take 1,000 mg every 8 hours until your temperature falls below 101.5, please not take more than 3,000 mg of acetaminophen either as a separate medication or as in ingredient in an over-the-counter cold/flu preparation within a 24-hour period.      Robitussin, Mucinex (guaifenesin): This is an expectorant.  This helps break up chest congestion and loosen up thick nasal drainage  making phlegm and drainage more liquid and therefore easier to remove.  I recommend being 400 mg three times daily as needed.      Please follow-up within the next 5-7 days either with your primary care provider or urgent care if your symptoms do not resolve.  If you do not have a primary care provider, we will assist you in finding one.   Thank you for visiting urgent care today.  We appreciate the opportunity to participate in your care.

## 2022-05-21 NOTE — ED Triage Notes (Signed)
Pt reports sore throat and bilateral ear pain 

## 2022-05-21 NOTE — ED Provider Notes (Signed)
[facility]    CSN: [id number] Arrival date & time: 05/21/22  1320    HISTORY   Chief Complaint  Patient presents with   Sore Throat   Ear Pain   HPI [name] is a pleasant, 36 y.o. female who presents to urgent care today. Patient complains of acute onset of sore throat 2 days ago, states that today her right ear hurts as well.  Patient has normal vital signs on arrival today.  Patient denies fever, aches, chills, nausea, vomiting, diarrhea, rhinorrhea, nasal congestion, cough, headache, known sick contacts.  Patient states she has not tried any remedies for her symptoms.  The history is provided by the patient.   Past Medical History:  Diagnosis Date   Anemia    Gestational diabetes 12/01/2020   Patient Active Problem List   Diagnosis Date Noted   Fatigue 01/05/2022   Presence of IUD 08/30/2021   History of 3 cesarean sections 04/26/2021   Prediabetes 02/18/2020   Hypopigmentation 08/29/2018   Female genital mutilation with clitorectomy 01/07/2016   Seasonal allergies 12/08/2015   History of anemia 09/03/2014   Language barrier 08/04/2014   Past Surgical History:  Procedure Laterality Date   CESAREAN SECTION  2009; 2011; 2016   x 2 in [place]   CESAREAN SECTION N/A 12/22/2014   Procedure: CESAREAN SECTION;  Surgeon: Tereso Newcomer, MD;  Location: [facility];  Service: Obstetrics;  Laterality: N/A;   CESAREAN SECTION N/A 04/29/2021   Procedure: CESAREAN SECTION;  Surgeon: Warden Fillers, MD;  Location: [facility];  Service: Obstetrics;  Laterality: N/A;   CHOLECYSTECTOMY N/A 01/31/2017   Procedure: LAPAROSCOPIC CHOLECYSTECTOMY WITH INTRAOPERATIVE CHOLANGIOGRAM; REPAIR OF UMBILICAL HERNIA;  Surgeon: Manus Rudd, MD;  Location: [facility];  Service: General;  Laterality: N/A;   CHOLECYSTECTOMY     ESOPHAGOGASTRODUODENOSCOPY N/A 01/29/2017   Procedure: ESOPHAGOGASTRODUODENOSCOPY (EGD);  Surgeon: Jeani Hawking, MD;  Location: [facility] END[facility];  Service: Endoscopy;   Laterality: N/A;   Female Circumcision     OB History     Gravida  5   Para  4   Term  4   Preterm  0   AB  1   Living  4      SAB  1   IAB  0   Ectopic  0   Multiple  0   Live Births  4        Obstetric Comments  In [place]        Home Medications    Prior to Admission medications   Medication Sig Start Date End Date Taking? Authorizing Provider  acetaminophen (TYLENOL) 500 MG tablet Take 2 tablets (1,000 mg total) by mouth every 6 (six) hours as needed. 03/22/22   Carlisle Beers, FNP  ibuprofen (ADVIL) 600 MG tablet Take 1 tablet (600 mg total) by mouth every 6 (six) hours as needed. 03/22/22   Carlisle Beers, FNP  Multiple Vitamins-Minerals (MULTIVITAMIN) tablet Take 1 tablet by mouth daily. 01/05/22   Carney Living, MD  ondansetron (ZOFRAN-ODT) 4 MG disintegrating tablet Take 1 tablet (4 mg total) by mouth every 8 (eight) hours as needed for nausea or vomiting. 03/22/22   Carlisle Beers, FNP    Family History Family History  Problem Relation Age of Onset   Hypertension Mother    Social History Social History   Tobacco Use   Smoking status: Never   Smokeless tobacco: Never  Vaping Use   Vaping Use: Never used  Substance Use Topics   Alcohol use: No    Alcohol/week: 0.0 standard drinks of alcohol   Drug use: No   Allergies   Patient has no known allergies.  Review of Systems Review of Systems Pertinent findings revealed after performing a 14 point review of systems has been noted in the history of present illness.  Physical Exam Triage Vital Signs ED Triage Vitals  Enc Vitals Group     BP 07/30/21 0827 (!) 147/82     Pulse Rate 07/30/21 0827 72     Resp 07/30/21 0827 18     Temp 07/30/21 0827 98.3 F (36.8 C)     Temp Source 07/30/21 0827 Oral     SpO2 07/30/21 0827 98 %     Weight --      Height --      Head Circumference --      Peak Flow --      Pain Score 07/30/21 0826 5     Pain Loc --      Pain Edu?  --      Excl. in GC? --   No data found.  Updated Vital Signs BP 123/82 (BP Location: Left Arm)   Pulse 79   Temp 98.1 F (36.7 C) (Oral)   Resp 18   SpO2 98%   Physical Exam Vitals and nursing note reviewed.  Constitutional:      General: She is not in acute distress.    Appearance: Normal appearance. She is not ill-appearing.  HENT:     Head: Normocephalic and atraumatic.     Salivary Glands: Right salivary gland is not diffusely enlarged or tender. Left salivary gland is not diffusely enlarged or tender.     Right Ear: Hearing, tympanic membrane and external ear normal. No drainage. No middle ear effusion. There is no impacted cerumen. Tympanic membrane is not erythematous or bulging.     Left Ear: Hearing, tympanic membrane, ear canal and external ear normal. No drainage.  No middle ear effusion. There is no impacted cerumen. Tympanic membrane is not erythematous or bulging.     Ears:     Comments: Right EAC with mild erythema    Nose: Nose normal. No nasal deformity, septal deviation, mucosal edema, congestion or rhinorrhea.     Right Turbinates: Not enlarged, swollen or pale.     Left Turbinates: Not enlarged, swollen or pale.     Right Sinus: No maxillary sinus tenderness or frontal sinus tenderness.     Left Sinus: No maxillary sinus tenderness or frontal sinus tenderness.     Mouth/Throat:     Lips: Pink. No lesions.     Mouth: Mucous membranes are moist. No oral lesions.     Pharynx: Oropharynx is clear. Uvula midline. No posterior oropharyngeal erythema or uvula swelling.     Tonsils: No tonsillar exudate. 0 on the right. 0 on the left.  Eyes:     General: Lids are normal.        Right eye: No discharge.        Left eye: No discharge.     Extraocular Movements: Extraocular movements intact.     Conjunctiva/sclera: Conjunctivae normal.     Right eye: Right conjunctiva is not injected.     Left eye: Left conjunctiva is not injected.  Neck:     Trachea: Trachea and  phonation normal.  Cardiovascular:     Rate and Rhythm: Normal rate and regular rhythm.     Pulses: Normal pulses.  Heart sounds: Normal heart sounds. No murmur heard.    No friction rub. No gallop.  Pulmonary:     Effort: Pulmonary effort is normal. No accessory muscle usage, prolonged expiration or respiratory distress.     Breath sounds: Normal breath sounds. No stridor, decreased air movement or transmitted upper airway sounds. No decreased breath sounds, wheezing, rhonchi or rales.  Chest:     Chest wall: No tenderness.  Musculoskeletal:        General: Normal range of motion.     Cervical back: Normal range of motion and neck supple. Normal range of motion.  Lymphadenopathy:     Cervical: No cervical adenopathy.  Skin:    General: Skin is warm and dry.     Findings: No erythema or rash.  Neurological:     General: No focal deficit present.     Mental Status: She is alert and oriented to person, place, and time.  Psychiatric:        Mood and Affect: Mood normal.        Behavior: Behavior normal.     Visual Acuity Right Eye Distance:   Left Eye Distance:   Bilateral Distance:    Right Eye Near:   Left Eye Near:    Bilateral Near:     UC Couse / Diagnostics / Procedures:     Radiology No results found.  Procedures Procedures (including critical care time) EKG  Pending results:  Labs Reviewed  CULTURE, GROUP A STREP ([facility])  SARS CORONAVIRUS 2 BY RT PCR  POCT RAPID STREP A, ED / UC  POC INFLUENZA A AND B ANTIGEN (URGENT CARE ONLY)    Medications Ordered in UC: Medications - No data to display  UC Diagnoses / Final Clinical Impressions(s)   I have reviewed the triage vital signs and the nursing notes.  Pertinent labs & imaging results that were available during my care of the patient were reviewed by me and considered in my medical decision making (see chart for details).    Final diagnoses:  Pharyngitis, unspecified etiology  Acute pharyngitis,  unspecified etiology  Otalgia of right ear  Rapid strep test today is negative, throat culture pending.  Rapid flu test was negative.  COVID-19 test pending.  Patient under the age of 51 and does not have any comorbidities creasing her risk for hospitalization secondary to COVID-19 infection, antivirals not indicated.  Patient with no longer benefit from Tamiflu either due to duration of symptoms.  Conservative care recommended, return precautions advised.  ED Prescriptions   None    PDMP not reviewed this encounter.  Disposition Upon Discharge:  Condition: stable for discharge home Home: take medications as prescribed; routine discharge instructions as discussed; follow up as advised.  Patient presented with an acute illness with associated systemic symptoms and significant discomfort requiring urgent management. In my opinion, this is a condition that a prudent lay person (someone who possesses an average knowledge of health and medicine) may potentially expect to result in complications if not addressed urgently such as respiratory distress, impairment of bodily function or dysfunction of bodily organs.   Routine symptom specific, illness specific and/or disease specific instructions were discussed with the patient and/or caregiver at length.   As such, the patient has been evaluated and assessed, work-up was performed and treatment was provided in alignment with urgent care protocols and evidence based medicine.  Patient/parent/caregiver has been advised that the patient may require follow up for further testing and treatment if the symptoms  continue in spite of treatment, as clinically indicated and appropriate.  If the patient was tested for COVID-19, Influenza and/or RSV, then the patient/parent/guardian was advised to isolate at home pending the results of his/her diagnostic coronavirus test and potentially longer if they're positive. I have also advised pt that if his/her COVID-19 test  returns positive, it's recommended to self-isolate for at least 10 days after symptoms first appeared AND until fever-free for 24 hours without fever reducer AND other symptoms have improved or resolved. Discussed self-isolation recommendations as well as instructions for household member/close contacts as per the [facility] and [place] [facility], and also gave patient the COVID packet with this information.  Patient/parent/caregiver has been advised to return to the [facility] or PCP in 3-5 days if no better; to PCP or the Emergency Department if new signs and symptoms develop, or if the current signs or symptoms continue to change or worsen for further workup, evaluation and treatment as clinically indicated and appropriate  The patient will follow up with their current PCP if and as advised. If the patient does not currently have a PCP we will assist them in obtaining one.   The patient may need specialty follow up if the symptoms continue, in spite of conservative treatment and management, for further workup, evaluation, consultation and treatment as clinically indicated and appropriate.  Patient/parent/caregiver verbalized understanding and agreement of plan as discussed.  All questions were addressed during visit.  Please see discharge instructions below for further details of plan.  Discharge Instructions:   Discharge Instructions      Your symptoms and physical exam findings are concerning for a viral respiratory infection.     Your rapid influenza test today was negative.  Due to the duration of your symptoms, you would no longer benefit from antiviral therapy for influenza.     You were tested for COVID-19 today.  The result of your COVID-19 test will be posted to your MyChart once it is complete, typically this takes 6 to 12 hours.  Because you are under the age of 31 and have a low risk of being hospitalized for symptoms of COVID-19, you would not benefit from antiviral treatment for COVID-19.   Your strep  test today is negative.  Streptococcal throat culture will be performed per our protocol.  The result of your throat culture will be posted to your MyChart once it is complete, this typically takes 3 to 5 days.  If your streptococcal throat culture is positive, you will be contacted by phone and antibiotics will be prescribed for you.     Please see the list below for recommended medications, dosages and frequencies to provide relief of your current symptoms:    Advil, Motrin (ibuprofen): This is a good anti-inflammatory medication which addresses aches, pains and inflammation of the upper airways that causes sinus and nasal congestion as well as in the lower airways which makes your cough feel tight and sometimes burn.  I recommend that you take between 400 to 600 mg every 6-8 hours as needed.      Tylenol (acetaminophen): This is a good fever reducer.  If your body temperature rises above 101.5 as measured with a thermometer, it is recommended that you take 1,000 mg every 8 hours until your temperature falls below 101.5, please not take more than 3,000 mg of acetaminophen either as a separate medication or as in ingredient in an over-the-counter cold/flu preparation within a 24-hour period.      Robitussin, Mucinex (guaifenesin): This  is an expectorant.  This helps break up chest congestion and loosen up thick nasal drainage making phlegm and drainage more liquid and therefore easier to remove.  I recommend being 400 mg three times daily as needed.      Please follow-up within the next 5-7 days either with your primary care provider or urgent care if your symptoms do not resolve.  If you do not have a primary care provider, we will assist you in finding one.   Thank you for visiting urgent care today.  We appreciate the opportunity to participate in your care.       This office note has been dictated using Teaching laboratory technician.  Unfortunately, this method of dictation can sometimes  lead to typographical or grammatical errors.  I apologize for your inconvenience in advance if this occurs.  Please do not hesitate to reach out to me if clarification is needed.      Theadora Rama Scales, PA-C 05/21/22 1621

## 2022-05-24 LAB — CULTURE, GROUP A STREP (THRC)

## 2022-06-03 DIAGNOSIS — Z419 Encounter for procedure for purposes other than remedying health state, unspecified: Secondary | ICD-10-CM | POA: Diagnosis not present

## 2022-07-03 DIAGNOSIS — Z419 Encounter for procedure for purposes other than remedying health state, unspecified: Secondary | ICD-10-CM | POA: Diagnosis not present

## 2022-08-03 DIAGNOSIS — Z419 Encounter for procedure for purposes other than remedying health state, unspecified: Secondary | ICD-10-CM | POA: Diagnosis not present

## 2022-08-09 NOTE — Progress Notes (Deleted)
    SUBJECTIVE:   CHIEF COMPLAINT / HPI:   Fatigue  PreDiabetes   PERTINENT  PMH / PSH: *** Patient Active Problem List   Diagnosis Date Noted   Fatigue 01/05/2022   Presence of IUD 08/30/2021   History of 3 cesarean sections 04/26/2021   Prediabetes 02/18/2020   Hypopigmentation 08/29/2018   Female genital mutilation with clitorectomy 01/07/2016   Seasonal allergies 12/08/2015   History of anemia 09/03/2014   Language barrier 08/04/2014    Current Outpatient Medications  Medication Instructions   acetaminophen (TYLENOL) 1,000 mg, Oral, Every 6 hours PRN   ibuprofen (ADVIL) 600 mg, Oral, Every 6 hours PRN   Multiple Vitamins-Minerals (MULTIVITAMIN) tablet 1 tablet, Oral, Daily   ondansetron (ZOFRAN-ODT) 4 mg, Oral, Every 8 hours PRN       05/21/2022    2:51 PM 05/03/2022   10:30 AM 03/23/2022    3:57 PM  Vitals with BMI  Weight  170 lbs 10 oz 098 lbs 3 oz  Systolic 119 147 829  Diastolic 82 88 74  Pulse 79 95 99      OBJECTIVE:   There were no vitals taken for this visit.  ***  ASSESSMENT/PLAN:   There are no diagnoses linked to this encounter.   There are no Patient Instructions on file for this visit.   Lind Covert, MD [place]

## 2022-08-10 ENCOUNTER — Ambulatory Visit: Payer: Medicaid Other | Admitting: Family Medicine

## 2022-09-02 DIAGNOSIS — Z419 Encounter for procedure for purposes other than remedying health state, unspecified: Secondary | ICD-10-CM | POA: Diagnosis not present

## 2022-09-28 ENCOUNTER — Encounter: Payer: Self-pay | Admitting: Family Medicine

## 2022-09-28 ENCOUNTER — Other Ambulatory Visit: Payer: Self-pay

## 2022-09-28 ENCOUNTER — Ambulatory Visit (INDEPENDENT_AMBULATORY_CARE_PROVIDER_SITE_OTHER): Payer: Medicaid Other | Admitting: Family Medicine

## 2022-09-28 VITALS — BP 113/82 | HR 96 | Wt 173.2 lb

## 2022-09-28 DIAGNOSIS — R7303 Prediabetes: Secondary | ICD-10-CM

## 2022-09-28 DIAGNOSIS — R1013 Epigastric pain: Secondary | ICD-10-CM | POA: Diagnosis not present

## 2022-09-28 LAB — POCT GLYCOSYLATED HEMOGLOBIN (HGB A1C): HbA1c, POC (prediabetic range): 5.9 % (ref 5.7–6.4)

## 2022-09-28 NOTE — Progress Notes (Signed)
    SUBJECTIVE:   CHIEF COMPLAINT / HPI:   Epigastric Pain Has pain in epigastric and chest area on and off.    Worse with palpation of area and with yawning.  Not related to exertion.  No vomiting but occaisional nausea.  No blood in bowel movement has intermittent loose stools or constipation.  No edema or weight loss.  Take ibuprofen rarely for headaches   Prediabetes Watching her diet and keeping active.       OBJECTIVE:   BP 113/82   Pulse 96   Wt 173 lb 3.2 oz (78.6 kg)   SpO2 100%   BMI 33.83 kg/m   Mobility:able to get up and down from exam table without assistance or distress Heart - Regular rate and rhythm.  No murmurs, gallops or rubs.    Lungs:  Normal respiratory effort, chest expands symmetrically. Lungs are clear to auscultation, no crackles or wheezes. Abdomen: soft and mildly tender in epigastric area  without masses, organomegaly or hernias noted.  No guarding or rebound Extremities:  No cyanosis, edema, or deformity noted with good range of motion of all major joints.     ASSESSMENT/PLAN:   Prediabetes Assessment & Plan: Well controlled   Orders: -     POCT glycosylated hemoglobin (Hb A1C)  Epigastric pain Assessment & Plan: Vague symptoms without red flags for cancer or severe ulcer or inflamatory bowel disease.  Will check h pylori.  If negative trial of PPI   Orders: -     H. pylori breath test     Patient Instructions  Good to see you today - Thank you for coming in  Things we discussed today:  Pain - will check to see if you have an infection - H Pylori  - I will let you know by My Chart    Carney Living, MD [facility]

## 2022-09-28 NOTE — Patient Instructions (Signed)
Good to see you today - Thank you for coming in  Things we discussed today:  Pain - will check to see if you have an infection - H Pylori  - I will let you know by My Chart

## 2022-09-28 NOTE — Assessment & Plan Note (Signed)
Well controlled 

## 2022-09-28 NOTE — Assessment & Plan Note (Signed)
Vague symptoms without red flags for cancer or severe ulcer or inflamatory bowel disease.  Will check h pylori.  If negative trial of PPI

## 2022-09-29 LAB — H. PYLORI BREATH TEST: H pylori Breath Test: POSITIVE — AB

## 2022-10-03 DIAGNOSIS — Z419 Encounter for procedure for purposes other than remedying health state, unspecified: Secondary | ICD-10-CM | POA: Diagnosis not present

## 2022-10-05 ENCOUNTER — Other Ambulatory Visit (HOSPITAL_COMMUNITY): Payer: Self-pay

## 2022-10-05 ENCOUNTER — Ambulatory Visit (INDEPENDENT_AMBULATORY_CARE_PROVIDER_SITE_OTHER): Payer: Medicaid Other | Admitting: Student

## 2022-10-05 VITALS — BP 110/70 | HR 81 | Ht 60.0 in | Wt 173.0 lb

## 2022-10-05 DIAGNOSIS — A048 Other specified bacterial intestinal infections: Secondary | ICD-10-CM | POA: Diagnosis not present

## 2022-10-05 MED ORDER — OMEPRAZOLE 20 MG PO CPDR
20.0000 mg | DELAYED_RELEASE_CAPSULE | Freq: Two times a day (BID) | ORAL | 0 refills | Status: DC
  Filled 2022-10-05 (×2): qty 20, 10d supply, fill #0

## 2022-10-05 MED ORDER — BISMUTH/METRONIDAZ/TETRACYCLIN 140-125-125 MG PO CAPS
3.0000 | ORAL_CAPSULE | Freq: Four times a day (QID) | ORAL | 0 refills | Status: DC
  Filled 2022-10-05 (×2): qty 120, 10d supply, fill #0

## 2022-10-05 NOTE — Progress Notes (Signed)
    SUBJECTIVE:   CHIEF COMPLAINT / HPI:   [name] is a 37 year old female here to discuss positive H. pylori breath test results. She was initially seen on 09/28/2022 with intermittent epigastric pain was mildly tender with palpation.  Since then, she has had no worsening of symptoms but was told to come in to discuss the complex treatment course.  PERTINENT  PMH / PSH: Prediabetes  OBJECTIVE:   BP 110/70   Pulse 81   Ht 5' (1.524 m)   Wt 173 lb (78.5 kg)   SpO2 98%   BMI 33.79 kg/m   General: Well-appearing, no distress CV: Regular rate rhythm Respiratory: Normal work of breathing on room air Abdomen: Mildly tender in epigastric region, otherwise unremarkable   ASSESSMENT/PLAN:   H. pylori infection Discussed H. pylori, pathogenesis and quadruple therapy. Prescribed Pylera 4 times daily and omeprazole 20 mg twice daily x 10 days.  Provided specific instructions as to have take these medications and discussed that Pylera comes in a blister pack. Should patient have any questions, she was encouraged to call our clinic. Will plan for repeat eradication H. pylori breath test in 4 weeks following completion of treatment. Patient understands and agrees to plan above.     Orvis Brill, [place]

## 2022-10-05 NOTE — Patient Instructions (Addendum)
It was great seeing you today.  You have H. Pylori which requires treatment.  Take "Pylera" as prescribed:  Take 3 capsules by mouth in the morning, at noon, in the evening, and at bedtime for 10 days.  In addition, take "Omeprazole" 1 capsule in the morning and at bedtime for 10 days.  After you complete treatment, we will test you to be sure that the H. pylori bacteria has resolved.  If you have any questions or concerns, please feel free to call the clinic.   Have a wonderful day,  Dr. Orvis Brill [facility] [phone]

## 2022-10-05 NOTE — Assessment & Plan Note (Signed)
Discussed H. pylori, pathogenesis and quadruple therapy. Prescribed Pylera 4 times daily and omeprazole 20 mg twice daily x 10 days.  Provided specific instructions as to have take these medications and discussed that Pylera comes in a blister pack. Should patient have any questions, she was encouraged to call our clinic. Will plan for repeat eradication H. pylori breath test in 4 weeks following completion of treatment. Patient understands and agrees to plan above.

## 2022-10-06 ENCOUNTER — Other Ambulatory Visit (HOSPITAL_COMMUNITY): Payer: Self-pay

## 2022-10-06 ENCOUNTER — Encounter: Payer: Self-pay | Admitting: Student

## 2022-10-11 ENCOUNTER — Encounter: Payer: Self-pay | Admitting: Family Medicine

## 2022-11-03 DIAGNOSIS — Z419 Encounter for procedure for purposes other than remedying health state, unspecified: Secondary | ICD-10-CM | POA: Diagnosis not present

## 2022-12-02 DIAGNOSIS — Z419 Encounter for procedure for purposes other than remedying health state, unspecified: Secondary | ICD-10-CM | POA: Diagnosis not present

## 2023-01-02 DIAGNOSIS — Z419 Encounter for procedure for purposes other than remedying health state, unspecified: Secondary | ICD-10-CM | POA: Diagnosis not present

## 2023-02-01 DIAGNOSIS — Z419 Encounter for procedure for purposes other than remedying health state, unspecified: Secondary | ICD-10-CM | POA: Diagnosis not present

## 2023-02-23 ENCOUNTER — Ambulatory Visit (HOSPITAL_COMMUNITY)
Admission: EM | Admit: 2023-02-23 | Discharge: 2023-02-23 | Disposition: A | Discharge status: Home / Self Care | Payer: Medicaid Other | Attending: Emergency Medicine | Admitting: Emergency Medicine

## 2023-02-23 ENCOUNTER — Encounter (HOSPITAL_COMMUNITY): Payer: Self-pay

## 2023-02-23 DIAGNOSIS — H6123 Impacted cerumen, bilateral: Secondary | ICD-10-CM | POA: Diagnosis not present

## 2023-02-23 MED ORDER — CARBAMIDE PEROXIDE 6.5 % OT SOLN: 5.0000 [drp] | Freq: Two times a day (BID) | OTIC | 0 refills | Status: DC

## 2023-02-23 NOTE — ED Provider Notes (Signed)
[facility]    CSN: [id number] Arrival date & time: 02/23/23  0950      History   Chief Complaint Chief Complaint  Patient presents with   Otalgia    HPI [name] is a 37 y.o. female.  Here with right ear pain and itching that started yesterday Maybe some hearing changes but nothing severe. No drainage  No fever, congestion, rhinorrhea, ST, cough. No recent illness Tried Tylenol that helped  Past Medical History:  Diagnosis Date   Anemia    Gestational diabetes 12/01/2020    Patient Active Problem List   Diagnosis Date Noted   H. pylori infection 10/05/2022   Epigastric pain 09/28/2022   Fatigue 01/05/2022   Presence of IUD 08/30/2021   History of 3 cesarean sections 04/26/2021   Prediabetes 02/18/2020   Hypopigmentation 08/29/2018   Female genital mutilation with clitorectomy 01/07/2016   Seasonal allergies 12/08/2015   History of anemia 09/03/2014   Language barrier 08/04/2014    Past Surgical History:  Procedure Laterality Date   CESAREAN SECTION  2009; 2011; 2016   x 2 in [place]   CESAREAN SECTION N/A 12/22/2014   Procedure: CESAREAN SECTION;  Surgeon: Tereso Newcomer, MD;  Location: [facility];  Service: Obstetrics;  Laterality: N/A;   CESAREAN SECTION N/A 04/29/2021   Procedure: CESAREAN SECTION;  Surgeon: Warden Fillers, MD;  [facility];  Service: Obstetrics;  Laterality: N/A;   CHOLECYSTECTOMY N/A 01/31/2017   Procedure: LAPAROSCOPIC CHOLECYSTECTOMY WITH INTRAOPERATIVE CHOLANGIOGRAM; REPAIR OF UMBILICAL HERNIA;  Surgeon: Manus Rudd, MD;  Location: [facility];  Service: General;  Laterality: N/A;   CHOLECYSTECTOMY     ESOPHAGOGASTRODUODENOSCOPY N/A 01/29/2017   Procedure: ESOPHAGOGASTRODUODENOSCOPY (EGD);  Surgeon: Jeani Hawking, MD;  Location: [facility] ENDOSCOPY;  Service: Endoscopy;  Laterality: N/A;   Female Circumcision      OB History     Gravida  5   Para  4   Term  4   Preterm  0   AB  1   Living  4      SAB  1   IAB   0   Ectopic  0   Multiple  0   Live Births  4        Obstetric Comments  In [place]          Home Medications    Prior to Admission medications   Medication Sig Start Date End Date Taking? Authorizing Provider  carbamide peroxide (DEBROX) 6.5 % OTIC solution Place 5 drops into both ears 2 (two) times daily. 02/23/23  Yes Satya Buttram, Lurena Joiner, PA-C  acetaminophen (TYLENOL) 500 MG tablet Take 2 tablets (1,000 mg total) by mouth every 6 (six) hours as needed. 03/22/22   Carlisle Beers, FNP  Bismuth/Metronidaz/Tetracyclin [facility]) [phone] MG CAPS Take 3 capsules by mouth in the morning, at noon, in the evening, and at bedtime for 10 days. 10/05/22 10/16/22  Dameron, Nolberto Hanlon, DO  omeprazole (PRILOSEC) 20 MG capsule Take 1 capsule (20 mg total) by mouth 2 (two) times daily before a meal for 10 days. 10/05/22 10/16/22  Darral Dash, DO    Family History Family History  Problem Relation Age of Onset   Hypertension Mother     Social History Social History   Tobacco Use   Smoking status: Never   Smokeless tobacco: Never  Vaping Use   Vaping Use: Never used  Substance Use Topics   Alcohol use: No    Alcohol/week: 0.0 standard drinks  of alcohol   Drug use: No     Allergies   Patient has no known allergies.   Review of Systems Review of Systems  HENT:  Positive for ear pain.    As per HPI  Physical Exam Triage Vital Signs ED Triage Vitals [02/23/23 1045]  Enc Vitals Group     BP 126/85     Pulse Rate 78     Resp 18     Temp 98.5 F (36.9 C)     Temp Source Oral     SpO2 98 %     Weight      Height      Head Circumference      Peak Flow      Pain Score 6     Pain Loc      Pain Edu?      Excl. in GC?    No data found.  Updated Vital Signs BP 126/85   Pulse 78   Temp 98.5 F (36.9 C) (Oral)   Resp 18   SpO2 98%   Breastfeeding No    Physical Exam Vitals and nursing note reviewed.  Constitutional:      General: She is not in acute  distress.    Appearance: Normal appearance.  HENT:     Right Ear: External ear normal.     Left Ear: External ear normal.     Ears:     Comments: Soft wax obstructing bilateral canals     Nose: No congestion or rhinorrhea.     Mouth/Throat:     Mouth: Mucous membranes are moist.     Pharynx: Oropharynx is clear.  Eyes:     Conjunctiva/sclera: Conjunctivae normal.  Cardiovascular:     Rate and Rhythm: Normal rate and regular rhythm.  Pulmonary:     Effort: Pulmonary effort is normal.  Musculoskeletal:     Cervical back: Normal range of motion.  Skin:    General: Skin is warm and dry.  Neurological:     Mental Status: She is alert and oriented to person, place, and time.     UC Treatments / Results  Labs (all labs ordered are listed, but only abnormal results are displayed) Labs Reviewed - No data to display  EKG  Radiology No results found.  Procedures Procedures (including critical care time)  Medications Ordered in UC Medications - No data to display  Initial Impression / Assessment and Plan / UC Course  I have reviewed the triage vital signs and the nursing notes.  Pertinent labs & imaging results that were available during my care of the patient were reviewed by me and considered in my medical decision making (see chart for details).  Bilateral ear irrigation moderately successful.  Patient reports her symptoms have improved.  There is still some soft wax in the canals but less than preirrigation. Recommend debrox drops BID x 5 Can return here or follow with primary for re-evaluation if needed  Final Clinical Impressions(s) / UC Diagnoses   Final diagnoses:  Excessive ear wax, bilateral     Discharge Instructions      I recommend to use the eardrops twice daily for the next 5 days.  This should help to dissolve some of the wax in your ears and relieve irritation. You can either return here if symptoms persist or follow-up with your primary care  provider.     ED Prescriptions     Medication Sig Dispense Auth. Provider   carbamide peroxide [facility])  6.5 % OTIC solution Place 5 drops into both ears 2 (two) times daily. 15 mL Delayne Sanzo, Lurena Joiner, PA-C      PDMP not reviewed this encounter.   Cassidy Tashiro, Lurena Joiner, [place] 02/23/23 1204

## 2023-02-23 NOTE — Discharge Instructions (Signed)
I recommend to use the eardrops twice daily for the next 5 days.  This should help to dissolve some of the wax in your ears and relieve irritation. You can either return here if symptoms persist or follow-up with your primary care provider.

## 2023-02-23 NOTE — ED Triage Notes (Signed)
Pt c/o rt ear itchy and pain since yesterday. States took tylenol with relief.

## 2023-03-01 ENCOUNTER — Ambulatory Visit: Payer: Medicaid Other | Admitting: Family Medicine

## 2023-03-01 NOTE — Progress Notes (Deleted)
    SUBJECTIVE:   CHIEF COMPLAINT / HPI:   H Pylori  PreDiabetes   PERTINENT  PMH / PSH: *** Patient Active Problem List   Diagnosis Date Noted   H. pylori infection 10/05/2022   Epigastric pain 09/28/2022   Fatigue 01/05/2022   Presence of IUD 08/30/2021   History of 3 cesarean sections 04/26/2021   Prediabetes 02/18/2020   Hypopigmentation 08/29/2018   Female genital mutilation with clitorectomy 01/07/2016   Seasonal allergies 12/08/2015   History of anemia 09/03/2014   Language barrier 08/04/2014    Current Outpatient Medications  Medication Instructions   acetaminophen (TYLENOL) 1,000 mg, Oral, Every 6 hours PRN   Bismuth/Metronidaz/Tetracyclin (PYLERA) 140-125-125 MG CAPS 3 capsules, Oral, 4 times daily   carbamide peroxide (DEBROX) 6.5 % OTIC solution 5 drops, Both EARS, 2 times daily   omeprazole (PRILOSEC) 20 mg, Oral, 2 times daily before meals       02/23/2023   10:45 AM 10/05/2022   11:21 AM 09/28/2022   10:01 AM  Vitals with BMI  Height  5\' 0"    Weight  173 lbs 173 lbs 3 oz  BMI  33.79   Systolic 126 110 409  Diastolic 85 70 82  Pulse 78 81 96      OBJECTIVE:   There were no vitals taken for this visit.  ***  ASSESSMENT/PLAN:   There are no diagnoses linked to this encounter.   There are no Patient Instructions on file for this visit.   Carney Living, MD [facility]

## 2023-03-04 DIAGNOSIS — Z419 Encounter for procedure for purposes other than remedying health state, unspecified: Secondary | ICD-10-CM | POA: Diagnosis not present

## 2023-03-22 ENCOUNTER — Encounter: Payer: Self-pay | Admitting: Family Medicine

## 2023-03-22 ENCOUNTER — Other Ambulatory Visit: Payer: Self-pay

## 2023-03-22 ENCOUNTER — Ambulatory Visit (INDEPENDENT_AMBULATORY_CARE_PROVIDER_SITE_OTHER): Payer: Medicaid Other | Admitting: Family Medicine

## 2023-03-22 VITALS — BP 121/89 | HR 93 | Ht 60.0 in | Wt 183.8 lb

## 2023-03-22 DIAGNOSIS — Z862 Personal history of diseases of the blood and blood-forming organs and certain disorders involving the immune mechanism: Secondary | ICD-10-CM | POA: Diagnosis not present

## 2023-03-22 DIAGNOSIS — R7303 Prediabetes: Secondary | ICD-10-CM

## 2023-03-22 LAB — POCT GLYCOSYLATED HEMOGLOBIN (HGB A1C): HbA1c, POC (controlled diabetic range): 6.5 % (ref 0.0–7.0)

## 2023-03-22 NOTE — Progress Notes (Signed)
    SUBJECTIVE:   CHIEF COMPLAINT / HPI:   Prediabetes Has gained some weight.  Admits to eating more carbs.  Not taking any medications.  Walks a lot at work  Epigastric pain  Much improved since finished H pylori treatment.  Not taking any antiacids   OBJECTIVE:   BP 121/89   Pulse 93   Ht 5' (1.524 m)   Wt 183 lb 12.8 oz (83.4 kg)   SpO2 100%   BMI 35.90 kg/m   Diabetic Foot Check -  Appearance - no lesions, ulcers or calluses Skin - no unusual pallor or redness.  Henna on both feet Sensation - intact  .Heart - Regular rate and rhythm.  No murmurs, gallops or rubs.    Lungs:  Normal respiratory effort, chest expands symmetrically. Lungs are clear to auscultation, no crackles or wheezes.    ASSESSMENT/PLAN:   Prediabetes Assessment & Plan: Stable.  Discussed diet changes. She feels she can try.  Check bmet    Orders: -     POCT glycosylated hemoglobin (Hb A1C) -     Basic metabolic panel  History of anemia Assessment & Plan: Asymptomatic. Will check cbc today   Orders: -     CBC     Patient Instructions  Good to see you today - Thank you for coming in  Things we discussed today:  Watch your diet not too much: rice bread pasta More veggies  I will call you if your tests are not good.  Otherwise, I will send you a message on MyChart (if it is active) or a letter in the mail..  If you do not hear from me with in 2 weeks please call our office.      Come back to see me in 3 months to check your A1c    Carney Living, MD [facility]

## 2023-03-22 NOTE — Patient Instructions (Signed)
Good to see you today - Thank you for coming in  Things we discussed today:  Watch your diet not too much: rice bread pasta More veggies  I will call you if your tests are not good.  Otherwise, I will send you a message on MyChart (if it is active) or a letter in the mail..  If you do not hear from me with in 2 weeks please call our office.      Come back to see me in 3 months to check your A1c

## 2023-03-22 NOTE — Assessment & Plan Note (Signed)
Asymptomatic. Will check cbc today

## 2023-03-22 NOTE — Assessment & Plan Note (Signed)
Stable.  Discussed diet changes. She feels she can try.  Check bmet

## 2023-03-23 LAB — BASIC METABOLIC PANEL
BUN/Creatinine Ratio: 25 — ABNORMAL HIGH (ref 9–23)
BUN: 13 mg/dL (ref 6–20)
CO2: 19 mmol/L — ABNORMAL LOW (ref 20–29)
Calcium: 9.5 mg/dL (ref 8.7–10.2)
Chloride: 105 mmol/L (ref 96–106)
Creatinine, Ser: 0.51 mg/dL — ABNORMAL LOW (ref 0.57–1.00)
Glucose: 109 mg/dL — ABNORMAL HIGH (ref 70–99)
Potassium: 4.5 mmol/L (ref 3.5–5.2)
Sodium: 138 mmol/L (ref 134–144)
eGFR: 122 mL/min/{1.73_m2} (ref >59)

## 2023-03-23 LAB — CBC
Hematocrit: 38.4 % (ref 34.0–46.6)
Hemoglobin: 12.3 g/dL (ref 11.1–15.9)
MCH: 24.2 pg — ABNORMAL LOW (ref 26.6–33.0)
MCHC: 32 g/dL (ref 31.5–35.7)
MCV: 76 fL — ABNORMAL LOW (ref 79–97)
Platelets: 336 10*3/uL (ref 150–450)
RBC: 5.08 x10E6/uL (ref 3.77–5.28)
RDW: 14.2 % (ref 11.7–15.4)
WBC: 6.4 10*3/uL (ref 3.4–10.8)

## 2023-04-03 DIAGNOSIS — Z419 Encounter for procedure for purposes other than remedying health state, unspecified: Secondary | ICD-10-CM | POA: Diagnosis not present

## 2023-05-04 DIAGNOSIS — Z419 Encounter for procedure for purposes other than remedying health state, unspecified: Secondary | ICD-10-CM | POA: Diagnosis not present

## 2023-06-04 DIAGNOSIS — Z419 Encounter for procedure for purposes other than remedying health state, unspecified: Secondary | ICD-10-CM | POA: Diagnosis not present

## 2023-06-14 ENCOUNTER — Ambulatory Visit: Payer: Medicaid Other | Admitting: Family Medicine

## 2023-06-14 NOTE — Progress Notes (Deleted)
    SUBJECTIVE:   CHIEF COMPLAINT / HPI:   ***  PERTINENT  PMH / PSH: *** Patient Active Problem List   Diagnosis Date Noted   Presence of IUD 08/30/2021   History of 3 cesarean sections 04/26/2021   Prediabetes 02/18/2020   Hypopigmentation 08/29/2018   Female genital mutilation with clitorectomy 01/07/2016   Seasonal allergies 12/08/2015   History of anemia 09/03/2014   Language barrier 08/04/2014    Current Outpatient Medications  Medication Instructions   acetaminophen (TYLENOL) 1,000 mg, Oral, Every 6 hours PRN       03/22/2023   10:38 AM 02/23/2023   10:45 AM 10/05/2022   11:21 AM  Vitals with BMI  Height 5\' 0"   5\' 0"   Weight 183 lbs 13 oz  173 lbs  BMI 35.9  33.79  Systolic 121 126 161  Diastolic 89 85 70  Pulse 93 78 81      OBJECTIVE:   There were no vitals taken for this visit.  ***  ASSESSMENT/PLAN:   Prediabetes     There are no Patient Instructions on file for this visit.   Carney Living, MD [facility]

## 2023-07-04 DIAGNOSIS — Z419 Encounter for procedure for purposes other than remedying health state, unspecified: Secondary | ICD-10-CM | POA: Diagnosis not present

## 2023-07-12 ENCOUNTER — Ambulatory Visit (INDEPENDENT_AMBULATORY_CARE_PROVIDER_SITE_OTHER): Payer: Medicaid Other | Admitting: Family Medicine

## 2023-07-12 ENCOUNTER — Encounter: Payer: Self-pay | Admitting: Family Medicine

## 2023-07-12 ENCOUNTER — Other Ambulatory Visit: Payer: Self-pay

## 2023-07-12 VITALS — BP 120/80 | HR 96 | Wt 183.6 lb

## 2023-07-12 DIAGNOSIS — R7303 Prediabetes: Secondary | ICD-10-CM | POA: Diagnosis not present

## 2023-07-12 LAB — POCT GLYCOSYLATED HEMOGLOBIN (HGB A1C): HbA1c, POC (prediabetic range): 6.2 % (ref 5.7–6.4)

## 2023-07-12 NOTE — Assessment & Plan Note (Signed)
Stable.  Recommend continued exercise and watching her diet.   Come in for physical in 6 months.  Likely recheck cholesterol then.

## 2023-07-12 NOTE — Patient Instructions (Signed)
Good to see you today - Thank you for coming in  Things we discussed today:  Your A1c is good Keep exercising Watch your diet - more vegetables less bread and sweets  Take a multivatimen once a day   Come back in 6 months for a blood test

## 2023-07-12 NOTE — Progress Notes (Signed)
    SUBJECTIVE:   CHIEF COMPLAINT / HPI:   Prediabetes Has been trying to watch her diet and is walking for exercise.  Her weight is unchanged.  Overall feels well   OBJECTIVE:   BP 120/80   Pulse 96   Wt 183 lb 9.6 oz (83.3 kg)   SpO2 100%   BMI 35.86 kg/m   Heart - Regular rate and rhythm.  No murmurs, gallops or rubs.    Lungs:  Normal respiratory effort, chest expands symmetrically. Lungs are clear to auscultation, no crackles or wheezes. Extremities:  No cyanosis, edema, or deformity noted with good range of motion of all major joints.     ASSESSMENT/PLAN:   Prediabetes Assessment & Plan: Stable.  Recommend continued exercise and watching her diet.   Come in for physical in 6 months.  Likely recheck cholesterol then.     Orders: -     POCT glycosylated hemoglobin (Hb A1C)     Patient Instructions  Good to see you today - Thank you for coming in  Things we discussed today:  Your A1c is good Keep exercising Watch your diet - more vegetables less bread and sweets  Take a multivatimen once a day   Come back in 6 months for a blood test     Carney Living, MD [facility]

## 2023-07-21 ENCOUNTER — Ambulatory Visit (HOSPITAL_COMMUNITY)
Admission: EM | Admit: 2023-07-21 | Discharge: 2023-07-21 | Disposition: A | Discharge status: Home / Self Care | Payer: Medicaid Other | Attending: Internal Medicine | Admitting: Internal Medicine

## 2023-07-21 ENCOUNTER — Encounter (HOSPITAL_COMMUNITY): Payer: Self-pay

## 2023-07-21 DIAGNOSIS — R42 Dizziness and giddiness: Secondary | ICD-10-CM

## 2023-07-21 MED ORDER — MECLIZINE HCL 25 MG PO TABS: 25.0000 mg | ORAL_TABLET | Freq: Three times a day (TID) | ORAL | 0 refills | Status: DC | PRN

## 2023-07-21 NOTE — ED Triage Notes (Signed)
Dizziness with movement and laying down onset today. Slight blurry vision. States this has never happened before. Slight sore throat. No known sick exposure.

## 2023-07-21 NOTE — Discharge Instructions (Addendum)
Go to the emergency department if your symptoms worsen, become severe, development of new symptoms or concerns.  Follow-up with your family doctor Monday if no improvement in symptoms

## 2023-07-21 NOTE — ED Provider Notes (Signed)
[facility]    CSN: [id number] Arrival date & time: 07/21/23  1624      History   Chief Complaint Chief Complaint  Patient presents with   Dizziness    HPI JINA GALANOS is a 37 y.o. female.    Dizziness Associated symptoms: headaches   Associated symptoms: no chest pain, no hearing loss, no nausea, no vomiting and no weakness   Started 3 days ago, states went to lay down in bed and felt the room was spinning, symptoms have been recurrent usually with position changes, lasts up to 1 minute.  His headache today.  Admits some blurred vision.  Has sore throat.  Denies fever, chills, sweats, rhinorrhea, nasal congestion, chest pain, palpitations, cough, injury.  Denies history of similar symptoms in the past.  Past medical history includes prediabetes no prescription medications.  Past Medical History:  Diagnosis Date   Anemia    Gestational diabetes 12/01/2020    Patient Active Problem List   Diagnosis Date Noted   Presence of IUD 08/30/2021   History of 3 cesarean sections 04/26/2021   Prediabetes 02/18/2020   Hypopigmentation 08/29/2018   Female genital mutilation with clitorectomy 01/07/2016   Seasonal allergies 12/08/2015   Language barrier 08/04/2014    Past Surgical History:  Procedure Laterality Date   CESAREAN SECTION  2009; 2011; 2016   x 2 in [place]   CESAREAN SECTION N/A 12/22/2014   Procedure: CESAREAN SECTION;  Surgeon: Tereso Newcomer, MD;  Location: [facility];  Service: Obstetrics;  Laterality: N/A;   CESAREAN SECTION N/A 04/29/2021   Procedure: CESAREAN SECTION;  Surgeon: Warden Fillers, MD;  [facility];  Service: Obstetrics;  Laterality: N/A;   CHOLECYSTECTOMY N/A 01/31/2017   Procedure: LAPAROSCOPIC CHOLECYSTECTOMY WITH INTRAOPERATIVE CHOLANGIOGRAM; REPAIR OF UMBILICAL HERNIA;  Surgeon: Manus Rudd, MD;  Location[facility];  Service: General;  Laterality: N/A;   CHOLECYSTECTOMY     ESOPHAGOGASTRODUODENOSCOPY N/A 01/29/2017    Procedure: ESOPHAGOGASTRODUODENOSCOPY (EGD);  Surgeon: Jeani Hawking, MD;  Location: [facility] ENDOSCOPY;  Service: Endoscopy;  Laterality: N/A;   Female Circumcision      OB History     Gravida  5   Para  4   Term  4   Preterm  0   AB  1   Living  4      SAB  1   IAB  0   Ectopic  0   Multiple  0   Live Births  4        Obstetric Comments  In [place]          Home Medications    Prior to Admission medications   Medication Sig Start Date End Date Taking? Authorizing Provider  acetaminophen (TYLENOL) 500 MG tablet Take 2 tablets (1,000 mg total) by mouth every 6 (six) hours as needed. 03/22/22   Carlisle Beers, FNP  IUD'S IU by Intrauterine route.    [provider]    Family History Family History  Problem Relation Age of Onset   Hypertension Mother     Social History Social History   Tobacco Use   Smoking status: Never   Smokeless tobacco: Never  Vaping Use   Vaping status: Never Used  Substance Use Topics   Alcohol use: No    Alcohol/week: 0.0 standard drinks of alcohol   Drug use: No     Allergies   Patient has no known allergies.   Review of Systems Review of Systems  Constitutional:  Negative for fever.  HENT:  Positive for sore throat. Negative for ear pain, hearing loss and trouble swallowing.   Eyes:  Positive for visual disturbance.  Cardiovascular:  Negative for chest pain.  Gastrointestinal:  Negative for nausea and vomiting.  Neurological:  Positive for dizziness and headaches. Negative for weakness.     Physical Exam Triage Vital Signs ED Triage Vitals  Encounter Vitals Group     BP 07/21/23 1709 113/66     Systolic BP Percentile --      Diastolic BP Percentile --      Pulse Rate 07/21/23 1709 80     Resp 07/21/23 1709 18     Temp 07/21/23 1709 97.9 F (36.6 C)     Temp Source 07/21/23 1709 Oral     SpO2 07/21/23 1709 98 %     Weight 07/21/23 1708 183 lb 10.3 oz (83.3 kg)     Height 07/21/23 1708 5'  (1.524 m)     Head Circumference --      Peak Flow --      Pain Score 07/21/23 1707 2     Pain Loc --      Pain Education --      Exclude from Growth Chart --    Orthostatic VS for the past 24 hrs:  BP- Lying Pulse- Lying BP- Sitting Pulse- Sitting BP- Standing at 0 minutes Pulse- Standing at 0 minutes  07/21/23 1710 113/66 83 115/79 98 112/73 109    Updated Vital Signs BP 113/66 (BP Location: Left Arm)   Pulse 80   Temp 97.9 F (36.6 C) (Oral)   Resp 18   Ht 5' (1.524 m)   Wt 183 lb 10.3 oz (83.3 kg)   LMP  (LMP Unknown)   SpO2 98%   BMI 35.87 kg/m   Visual Acuity Right Eye Distance:   Left Eye Distance:   Bilateral Distance:    Right Eye Near:   Left Eye Near:    Bilateral Near:     Physical Exam Vitals and nursing note reviewed.  Constitutional:      Appearance: She is not ill-appearing.  HENT:     Head: Normocephalic.     Right Ear: There is impacted cerumen.     Left Ear: Tympanic membrane and ear canal normal.     Nose: No congestion or rhinorrhea.     Mouth/Throat:     Pharynx: Oropharynx is clear.  Eyes:     Conjunctiva/sclera: Conjunctivae normal.  Cardiovascular:     Rate and Rhythm: Normal rate.     Heart sounds: Normal heart sounds.  Pulmonary:     Effort: Pulmonary effort is normal.     Breath sounds: Normal breath sounds.  Skin:    General: Skin is warm.  Neurological:     Mental Status: She is alert and oriented to person, place, and time.     Cranial Nerves: No cranial nerve deficit.     Motor: No weakness.     Coordination: Coordination normal.     Gait: Gait normal.  Psychiatric:        Mood and Affect: Mood normal.      UC Treatments / Results  Labs (all labs ordered are listed, but only abnormal results are displayed) Labs Reviewed - No data to display  EKG   Radiology No results found.  Procedures Procedures (including critical care time)  Medications Ordered in UC Medications - No data to display  Initial  Impression /  Assessment and Plan / UC Course  I have reviewed the triage vital signs and the nursing notes.  Pertinent labs & imaging results that were available during my care of the patient were reviewed by me and considered in my medical decision making (see chart for details).     Healthy 37 year old female with dizziness described as room spinning, episodic for 3 days worse with position changes now associated with headache. Her logic exam is normal, suspect vertigo. Final Clinical Impressions(s) / UC Diagnoses   Final diagnoses:  None   Discharge Instructions   None    ED Prescriptions   None    PDMP not reviewed this encounter.   Meliton Rattan, [place] 07/21/23 1726

## 2023-08-04 DIAGNOSIS — Z419 Encounter for procedure for purposes other than remedying health state, unspecified: Secondary | ICD-10-CM | POA: Diagnosis not present

## 2023-09-03 DIAGNOSIS — Z419 Encounter for procedure for purposes other than remedying health state, unspecified: Secondary | ICD-10-CM | POA: Diagnosis not present

## 2023-09-14 ENCOUNTER — Ambulatory Visit (HOSPITAL_COMMUNITY): Payer: Self-pay

## 2023-10-04 DIAGNOSIS — Z419 Encounter for procedure for purposes other than remedying health state, unspecified: Secondary | ICD-10-CM | POA: Diagnosis not present

## 2023-11-04 DIAGNOSIS — Z419 Encounter for procedure for purposes other than remedying health state, unspecified: Secondary | ICD-10-CM | POA: Diagnosis not present

## 2023-12-02 DIAGNOSIS — Z419 Encounter for procedure for purposes other than remedying health state, unspecified: Secondary | ICD-10-CM | POA: Diagnosis not present

## 2023-12-27 NOTE — Telephone Encounter (Signed)
error 

## 2024-01-13 DIAGNOSIS — Z419 Encounter for procedure for purposes other than remedying health state, unspecified: Secondary | ICD-10-CM | POA: Diagnosis not present

## 2024-01-24 ENCOUNTER — Encounter: Payer: Self-pay | Admitting: Student

## 2024-01-24 ENCOUNTER — Ambulatory Visit: Admitting: Student

## 2024-01-24 VITALS — BP 113/89 | HR 94 | Ht 60.0 in | Wt 186.0 lb

## 2024-01-24 DIAGNOSIS — E119 Type 2 diabetes mellitus without complications: Secondary | ICD-10-CM | POA: Diagnosis not present

## 2024-01-24 DIAGNOSIS — R7303 Prediabetes: Secondary | ICD-10-CM | POA: Diagnosis not present

## 2024-01-24 LAB — POCT GLYCOSYLATED HEMOGLOBIN (HGB A1C): HbA1c, POC (prediabetic range): 6.5 % — AB (ref 5.7–6.4)

## 2024-01-24 MED ORDER — METFORMIN HCL ER 500 MG PO TB24: 500.0000 mg | ORAL_TABLET | Freq: Every day | ORAL | 2 refills | Status: DC

## 2024-01-24 NOTE — Progress Notes (Signed)
    SUBJECTIVE:   CHIEF COMPLAINT / HPI:   Prediabetes History of gestational diabetes on metformin .  Has struggled with prediabetes for several years.  Last A1c of 6.26 months ago.  She continues to walk every other day, and reduced added sugars and processed foods.  Reports some increased thirst and urinary frequency. Denies chest pain, shortness of breath, neuropathy.  Has not had eye exam this year.  OBJECTIVE:   BP 113/89   Pulse 94   Ht 5' (1.524 m)   Wt 186 lb (84.4 kg)   SpO2 100%   BMI 36.33 kg/m    General: NAD, pleasant Cardio: RRR, no MRG. Cap Refill <2s. Respiratory: CTAB, normal wob on RA Skin: Warm and dry  ASSESSMENT/PLAN:   Assessment & Plan Type 2 diabetes mellitus without complication, without long-term current use of insulin  (HCC) A1c 6.5.  Newly diagnosed type 2 diabetes.  Educated patient on diabetes.  Using shared decision making, will start medication in addition to lifestyle modifications. - Metformin  500 mg daily with breakfast - BMP/lipid panel - Consider moderate to high intensity statin pending lab work - Counseled on eye exam - Follow-up in 3 months   Follow-up recommendation Obtain UACR/A1C at next visit  Lavada Porteous, DO [facility]

## 2024-01-24 NOTE — Progress Notes (Deleted)
    SUBJECTIVE:   Chief compliant/HPI: annual examination  [name] is a 38 y.o. who presents today for an annual exam.   History tabs reviewed and updated ***.   Review of systems form reviewed and notable for ***.   OBJECTIVE:   There were no vitals taken for this visit.  ***  ASSESSMENT/PLAN:   Assessment & Plan Prediabetes  Annual Examination  See AVS for age appropriate recommendations.   PHQ score ***, reviewed and discussed.  Blood pressure reviewed and at goal ***.  Asked about intimate partner violence and resources given as appropriate  The patient currently uses *** for contraception. Folate recommended as appropriate, minimum of 400 mcg per day.   Considered the following items based upon USPSTF recommendations: Diabetes screening: {discussed/ordered:14545} Screening for elevated cholesterol: {discussed/ordered:14545} HIV testing: {discussed/ordered:14545} Hepatitis C: {discussed/ordered:14545} Hepatitis B: {discussed/ordered:14545} Syphilis if at high risk: {discussed/ordered:14545} GC/CT {GC/CT screening :23818} Reviewed risk factors for latent tuberculosis and {not indicated/requested/declined:14582} Reviewed risk factors for osteoporosis. Using FRAX tool estimated risk of major osteoporotic fracture of  ***%, early screening {ordered not order:23822::"not ordered"}  Discussed family history, BRCA testing {not indicated/requested/declined:14582}. Tool used to risk stratify was ***.  Cervical cancer screening: {PAPTYPE:23819} Breast cancer screening: {mammoscreen:23820} Colorectal cancer screening: {crcscreen:23821::"discussed, colonoscopy ordered"} if age 47 or over.   Follow up in 1 *** year or sooner if indicated.    Lavada Porteous, DO [facility]

## 2024-01-24 NOTE — Patient Instructions (Addendum)
 It was great to see you! Thank you for allowing me to participate in your care!   I recommend that you always bring your medications to each appointment as this makes it easy to ensure we are on the correct medications and helps us  not miss when refills are needed.  Our plans for today:  - Please take 500 mg of metformin  every morning with breakfast - Please follow-up in 3 months for repeat A1c - Please schedule a diabetic eye exam - We are checking some labs today, I will call you if they are abnormal will send you a MyChart message or a letter if they are normal.  If you do not hear about your labs in the next 2 weeks please let us  know.  Take care and seek immediate care sooner if you develop any concerns. Please remember to show up 15 minutes before your scheduled appointment time!  Lavada Porteous, DO [facility]

## 2024-01-24 NOTE — Assessment & Plan Note (Signed)
 A1c 6.5.  Newly diagnosed type 2 diabetes.  Educated patient on diabetes.  Using shared decision making, will start medication in addition to lifestyle modifications. - Metformin  500 mg daily with breakfast - BMP/lipid panel - Consider moderate to high intensity statin pending lab work - Counseled on eye exam - Follow-up in 3 months

## 2024-01-25 LAB — BASIC METABOLIC PANEL WITH GFR
BUN/Creatinine Ratio: 19 (ref 9–23)
BUN: 11 mg/dL (ref 6–24)
CO2: 20 mmol/L (ref 20–29)
Calcium: 9.5 mg/dL (ref 8.7–10.2)
Chloride: 102 mmol/L (ref 96–106)
Creatinine, Ser: 0.59 mg/dL (ref 0.57–1.00)
Glucose: 109 mg/dL — ABNORMAL HIGH (ref 70–99)
Potassium: 4.7 mmol/L (ref 3.5–5.2)
Sodium: 137 mmol/L (ref 134–144)
eGFR: 117 mL/min/{1.73_m2} (ref >59)

## 2024-01-25 LAB — LIPID PANEL
Chol/HDL Ratio: 3.5 ratio (ref 0.0–4.4)
Cholesterol, Total: 181 mg/dL (ref 100–199)
HDL: 51 mg/dL (ref >39)
LDL Chol Calc (NIH): 100 mg/dL — ABNORMAL HIGH (ref 0–99)
Triglycerides: 176 mg/dL — ABNORMAL HIGH (ref 0–149)
VLDL Cholesterol Cal: 30 mg/dL (ref 5–40)

## 2024-02-06 ENCOUNTER — Telehealth: Payer: Self-pay | Admitting: Student

## 2024-02-06 NOTE — Telephone Encounter (Signed)
 Patient have an appointment 06/23 @1050  with you

## 2024-02-06 NOTE — Telephone Encounter (Signed)
 Called patient, confirmed identity.  Discussed recommendation for moderate intensity statin given diabetes and age 38.  Reassuringly, her ASCVD risk is low (0.8%).  Using shared decision making, patient will initiate moderate intensity statin.  I have sent a prescription for 10 mg atorvastatin.  Additionally, I will forward this message to our team to schedule an appointment with her PCP in the next 6-8 weeks to see how she is tolerating the medication and answer any further questions given her new diagnosis of diabetes.

## 2024-02-12 DIAGNOSIS — Z419 Encounter for procedure for purposes other than remedying health state, unspecified: Secondary | ICD-10-CM | POA: Diagnosis not present

## 2024-03-14 DIAGNOSIS — Z419 Encounter for procedure for purposes other than remedying health state, unspecified: Secondary | ICD-10-CM | POA: Diagnosis not present

## 2024-03-25 ENCOUNTER — Ambulatory Visit (INDEPENDENT_AMBULATORY_CARE_PROVIDER_SITE_OTHER): Admitting: Student

## 2024-03-25 ENCOUNTER — Other Ambulatory Visit (HOSPITAL_COMMUNITY)
Admission: RE | Admit: 2024-03-25 | Discharge: 2024-03-25 | Disposition: A | Discharge status: Home / Self Care | Source: Ambulatory Visit | Attending: Family Medicine | Admitting: Family Medicine

## 2024-03-25 ENCOUNTER — Encounter: Payer: Self-pay | Admitting: Student

## 2024-03-25 VITALS — BP 107/82 | HR 90 | Ht 60.0 in | Wt 183.0 lb

## 2024-03-25 DIAGNOSIS — E119 Type 2 diabetes mellitus without complications: Secondary | ICD-10-CM | POA: Diagnosis not present

## 2024-03-25 DIAGNOSIS — N898 Other specified noninflammatory disorders of vagina: Secondary | ICD-10-CM | POA: Diagnosis not present

## 2024-03-25 LAB — POCT WET PREP (WET MOUNT)
Clue Cells Wet Prep Whiff POC: NEGATIVE
Trichomonas Wet Prep HPF POC: ABSENT
WBC, Wet Prep HPF POC: >20

## 2024-03-25 LAB — POCT GLYCOSYLATED HEMOGLOBIN (HGB A1C): HbA1c, POC (controlled diabetic range): 6.6 % (ref 0.0–7.0)

## 2024-03-25 MED ORDER — FLUTICASONE PROPIONATE 50 MCG/ACT NA SUSP: 2.0000 | Freq: Every day | NASAL | 6 refills | Status: AC

## 2024-03-25 NOTE — Progress Notes (Signed)
    SUBJECTIVE:   CHIEF COMPLAINT / HPI:   Vaginal Itching: Has IUD  She has experienced vaginal itching for one week with a small amount of liquid discharge. Occasional mild stomach pain and swelling with mild pain during urination are noted. She is sexually active, denies sexually transmitted infections, and has had an IUD for three years. She has a sore throat and cough, managed with over-the-counter medication, without fever, chills, or allergies.  No oral sexual intercourse noted   PERTINENT  PMH / PSH:  Prediabetes on metformin    OBJECTIVE:   BP 107/82   Pulse 90   Ht 5' (1.524 m)   Wt 183 lb (83 kg)   SpO2 100%   BMI 35.74 kg/m   General: Alert and oriented in no apparent distress Heart: Regular rate and rhythm with no murmurs appreciated Lungs: CTA bilaterally, no wheezing Abdomen: Bowel sounds present, no abdominal pain Skin: Warm and dry GU: Chaperone present, cervix normal with strings out of os, white vaginal discharge. No other abnormalities    ASSESSMENT/PLAN:   Assessment & Plan Vaginal itching Reviewed labs and allergies, will check GC/Chlamydia, Trichomonas, RPR, and HIV wet prep and will call patient with results. Discussed safe sex practices. Will schedule pap if indicated - not indicated. Patient's questions answered to their satisfaction.  Type 2 diabetes mellitus without complication, without long-term current use of insulin  (HCC) A1C today, UACr ordered      Laurier Hugger, MD [facility]

## 2024-03-25 NOTE — Patient Instructions (Signed)
 It was great to see you today! Thank you for choosing [facility] for your primary care.  Today we addressed: We will check labs today and I will update with the results  I have ordered a nasal spray to use two sprays in each nostril a day Take tylenol  for pain or use numbing spray for throat soreness If pain persists in the next week or two please return   If you haven't already, sign up for My Chart to have easy access to your labs results, and communication with your primary care physician.   Please arrive 15 minutes before your appointment to ensure smooth check in process.  We appreciate your efforts in making this happen.  Thank you for allowing me to participate in your care, Laurier Hugger, MD 03/25/2024, 10:50 AM PGY-3, [facility]

## 2024-03-25 NOTE — Assessment & Plan Note (Signed)
 A1C today, UACr ordered

## 2024-03-26 ENCOUNTER — Ambulatory Visit: Payer: Self-pay | Admitting: Student

## 2024-03-26 ENCOUNTER — Other Ambulatory Visit: Payer: Self-pay | Admitting: Student

## 2024-03-26 LAB — CERVICOVAGINAL ANCILLARY ONLY
Chlamydia: NEGATIVE
Comment: NEGATIVE
Comment: NEGATIVE
Comment: NORMAL
Neisseria Gonorrhea: NEGATIVE
Trichomonas: NEGATIVE

## 2024-03-26 LAB — RPR: RPR Ser Ql: NONREACTIVE

## 2024-03-26 LAB — HIV ANTIBODY (ROUTINE TESTING W REFLEX): HIV Screen 4th Generation wRfx: NONREACTIVE

## 2024-03-26 MED ORDER — FLUCONAZOLE 150 MG PO TABS: 150.0000 mg | ORAL_TABLET | Freq: Once | ORAL | 0 refills | Status: AC

## 2024-04-13 DIAGNOSIS — Z419 Encounter for procedure for purposes other than remedying health state, unspecified: Secondary | ICD-10-CM | POA: Diagnosis not present

## 2024-05-14 DIAGNOSIS — Z419 Encounter for procedure for purposes other than remedying health state, unspecified: Secondary | ICD-10-CM | POA: Diagnosis not present

## 2024-05-23 ENCOUNTER — Ambulatory Visit (INDEPENDENT_AMBULATORY_CARE_PROVIDER_SITE_OTHER): Admitting: Family Medicine

## 2024-05-23 ENCOUNTER — Encounter: Payer: Self-pay | Admitting: Family Medicine

## 2024-05-23 VITALS — BP 107/86 | HR 77 | Ht 60.0 in | Wt 182.4 lb

## 2024-05-23 DIAGNOSIS — E119 Type 2 diabetes mellitus without complications: Secondary | ICD-10-CM | POA: Diagnosis not present

## 2024-05-23 DIAGNOSIS — Z758 Other problems related to medical facilities and other health care: Secondary | ICD-10-CM | POA: Diagnosis not present

## 2024-05-23 DIAGNOSIS — Z23 Encounter for immunization: Secondary | ICD-10-CM

## 2024-05-23 DIAGNOSIS — E66812 Obesity, class 2: Secondary | ICD-10-CM | POA: Diagnosis not present

## 2024-05-23 DIAGNOSIS — Z6835 Body mass index (BMI) 35.0-35.9, adult: Secondary | ICD-10-CM

## 2024-05-23 DIAGNOSIS — Z603 Acculturation difficulty: Secondary | ICD-10-CM

## 2024-05-23 MED ORDER — WEGOVY 0.25 MG/0.5ML ~~LOC~~ SOAJ: 0.2500 mg | SUBCUTANEOUS | 0 refills | Status: DC

## 2024-05-23 NOTE — Patient Instructions (Addendum)
 Start a injection medication Wegovy  to treat diabetes and increased weight.   Inject 0.25 mg of Wegovy  once a week until you see Dr Jaymon Dudek again at the [facility].  If you have questions how to use the injection pen, let the pharmacist know. They will demonstrate how to use it.  You may also let our office know you need more teaching about using the pen.  Our office pharmacists may also teach you how to use the Wegovy  pen.     You will be called by [facility] to schedule an appointment for your eye exam.    It is alright to take Chromium pills for your diabetes.   We will discuss Influenza vaccination at the next visit.   You received a vaccination to prevent you having pneumococcal pneumonia.   ???? ????? ????? ??? ?????? ?????? ?????.  ???? 0.25 ??? ?? ?????? ??? ????? ???????? ??? ???? ????? ??????? ?????????? ?????? ?? ???? ?? ??????.  ??? ???? ???? ?? ????? ??? ????? ??????? ??? ?????? ????? ???????. ????? ?? ????? ????????.  ????? ????? ????? ??????? ???? ????? ??? ???? ?? ????? ??? ??????? ?????. ?? ????? ?????? ??????? ????? ????? ??????? ??? ??????.  ????? ?? ???? ????? ??????? ????? ?????? ?????? ???? ???? ?????.  ?? ??? ?????? ????? ????????? ????? ??? ??????.  ?????? ???? ?????????? ?? ??????? ???????.  ??? ????? ?????? ??????? ?? ???????? ?????? ????????? ???????. abda bitanawul dawa' wayaghufi ean tariq alhuqn lieilaj da' alsukari waziadat alwazni. aihqin 0.25 mulaghun min wighwfi maratan wahidatan asbweyan hataa maweid ziarat alduktur makdyarmid mjddan fi markaz tibi al'usrati. 'iidha kanat ladayk 'ayu 'asyilat hawl kayfiat astikhdam qalam alhuqni, fa'akhbar alsaydali. sayashrah lak kayfiat astikhdamihi. yumkinuk aydan 'iikhbar eiadatina bi'anak bihajat 'iilaa mazid min alsharh hawl aistikhdam alqalami. qad yuealimuk sayadilat eiadatina aydan kayfiat aistikhdam qalam wayghufi. sayatasil bik markaz tarayd lilshabakiat waeuyun  alsukarii litahdid maweid lifahs eaynayk. la bas bitanawul 'aqras alkurumyum lieilaj da' alsukari. sanunaqish liqah al'iinfilwanza fi alziyarat alqadimati. laqad talaqayt lqahan lilwiqayat min alialtihab alriyawii bialmukawarat alriyawiati.  SABRA

## 2024-05-24 ENCOUNTER — Ambulatory Visit: Payer: Self-pay | Admitting: Family Medicine

## 2024-05-24 ENCOUNTER — Encounter: Payer: Self-pay | Admitting: Family Medicine

## 2024-05-24 DIAGNOSIS — E66812 Obesity, class 2: Secondary | ICD-10-CM | POA: Insufficient documentation

## 2024-05-24 LAB — MICROALBUMIN / CREATININE URINE RATIO
Creatinine, Urine: 77.5 mg/dL
Microalb/Creat Ratio: <4 mg/g{creat} (ref 0–29)
Microalbumin, Urine: <3 ug/mL

## 2024-05-24 NOTE — Assessment & Plan Note (Signed)
 Established problem Uncontrolled.   Wt Readings from Last 3 Encounters:  05/23/24 182 lb 6 oz (82.7 kg)  03/25/24 183 lb (83 kg)  01/24/24 186 lb (84.4 kg)  04/03/23 173 lbs   [name] is frustrated by her inability to lose weight despite her healthy low fate diet with many daily vegetables, grain foods, and dairy only occasional meats for last year. She is taking exercise at least three times a week of walking in her neighborhood with friends for 30 to 40 minutes at a time.  She is committed to losing weight and to using Wegovy  as directed in combination with her low fat diet with limiting dairy to low fat and increase in her walking schedule by one more day a week for 30 to 40 minutes. She would like to lose 20 pounds over the next year.  Start Wegovy  0.25 mg Virgil weekly, Return to clinic 4-5 weeks

## 2024-05-24 NOTE — Assessment & Plan Note (Addendum)
 Established problem Unable to check capillary blood glucose as does not have glucometer.   She is unable to tolerate higher doses of metformin  because of nausea.   Her diet is mostly vegetables, grains, and occasional beef, chicken or lamb.  She walks in her neighborhood three times a week for thirty to 40 minutes at a time.   She is willing to consider taking a statin  She is interested in starting a GLP-1 for glycemic control and weight loss.  Start Wegovy  0.25 mg [name] weekly for 4 weeks.  Return to clinic in 4-5 weeks. Continue Metformin   Referral placed for ophthalmology for diabetic eye exam. [name] received Prevnar 20 this visit.  Will discuss Inffluenz vacciantion and checking HBV triple screen next office visit

## 2024-05-24 NOTE — Progress Notes (Signed)
 [name] is alone Sources of clinical information for visit is/are patient. Nursing assessment for this office visit was reviewed with the patient for accuracy and revision.   [name] declined benefit of interpreter for visit.   Previous Report(s) Reviewed: none     05/23/2024   11:16 AM  Depression screen PHQ 2/9  Decreased Interest 0  Down, Depressed, Hopeless 0  PHQ - 2 Score 0  Altered sleeping 0  Tired, decreased energy 0  Change in appetite 0  Feeling bad or failure about yourself  0  Trouble concentrating 0  Moving slowly or fidgety/restless 0  Suicidal thoughts 0  PHQ-9 Score 0  Difficult doing work/chores Not difficult at all   [employer] Office Visit from 05/23/2024 in [facility] - [facility] [place]. [facility] Office Visit from 03/25/2024 in [facility] [place] [facility] Office Visit from 01/24/2024 in [facility] - [facility] Of [place] [facility]  Thoughts that you would be better off dead, or of hurting yourself in some way Not at all Not at all Not at all  PHQ-9 Total Score 0 0 0       07/12/2023    9:55 AM 03/22/2023   10:38 AM 09/28/2022   10:02 AM 05/03/2022   10:31 AM 01/05/2022   10:58 AM  Fall Risk   Falls in the past year? 0 0 0 0 0  Number falls in past yr: 0 0 0 0 0  Injury with Fall? 0 0 0 0 0       05/23/2024   11:16 AM 03/25/2024   10:45 AM 01/24/2024   10:43 AM  PHQ9 SCORE ONLY  PHQ-9 Total Score 0 0  0      Data saved with a previous flowsheet row definition    There are no preventive care reminders to display for this patient.  Health Maintenance Due  Topic Date Due   OPHTHALMOLOGY EXAM  Never done   Diabetic kidney evaluation - Urine ACR  Never done   HPV VACCINES (1 - 3-dose SCDM series) Never done   Hepatitis B Vaccines 19-59 Average Risk (2 of 3 - 19+ 3-dose series) 03/08/2017   INFLUENZA VACCINE  05/03/2024      History/P.E.  limitations: communication barrier Language  There are no preventive care reminders to display for this patient.  Diabetes Health Maintenance Due  Topic Date Due   OPHTHALMOLOGY EXAM  Never done   HEMOGLOBIN A1C  09/24/2024    Health Maintenance Due  Topic Date Due   OPHTHALMOLOGY EXAM  Never done   Diabetic kidney evaluation - Urine ACR  Never done   HPV VACCINES (1 - 3-dose SCDM series) Never done   Hepatitis B Vaccines 19-59 Average Risk (2 of 3 - 19+ 3-dose series) 03/08/2017   INFLUENZA VACCINE  05/03/2024     Chief Complaint  Patient presents with   Medical Management of Chronic Issues    diabetes     Discussed the use of AI scribe software for clinical note transcription with the patient, who gave verbal consent to proceed.  History of Present Illness      SDOH Screenings   Food Insecurity: Food Insecurity Present (04/22/2021)  Transportation Needs: No Transportation Needs (04/22/2021)  Depression (PHQ2-9): Low Risk  (05/23/2024)  Tobacco Use: Low Risk  (05/23/2024)   --------------------------------------------------------------------------------------------------------------------------------------------- Visit Problem List with  Assessment and Plan   Assessment and Plan Assessment & Plan      No problem-specific Assessment & Plan notes found for this encounter.

## 2024-05-28 ENCOUNTER — Telehealth: Payer: Self-pay

## 2024-05-28 NOTE — Telephone Encounter (Signed)
 Prior authorization submitted for WEGOVY  0.25/0.5MG  to [facility] MEDICAID via Lat[employer].   Key: [id number]

## 2024-05-29 NOTE — Telephone Encounter (Signed)
 Pharmacy Patient Advocate Encounter  Received notification from [facility] MEDICAID that Prior Authorization for WEGOVY  0.25/0.5MG  has been APPROVED from 05/28/24 to 11/24/24   PA #/Case ID/Reference #: [id number]

## 2024-06-14 DIAGNOSIS — Z419 Encounter for procedure for purposes other than remedying health state, unspecified: Secondary | ICD-10-CM | POA: Diagnosis not present

## 2024-07-02 ENCOUNTER — Other Ambulatory Visit: Payer: Self-pay | Admitting: Student

## 2024-07-02 DIAGNOSIS — E119 Type 2 diabetes mellitus without complications: Secondary | ICD-10-CM

## 2024-09-12 ENCOUNTER — Ambulatory Visit (INDEPENDENT_AMBULATORY_CARE_PROVIDER_SITE_OTHER)

## 2024-09-12 ENCOUNTER — Ambulatory Visit

## 2024-09-12 VITALS — BP 121/91 | HR 89 | Ht 63.0 in | Wt 181.8 lb

## 2024-09-12 DIAGNOSIS — E119 Type 2 diabetes mellitus without complications: Secondary | ICD-10-CM

## 2024-09-12 DIAGNOSIS — N930 Postcoital and contact bleeding: Secondary | ICD-10-CM

## 2024-09-12 LAB — POCT GLYCOSYLATED HEMOGLOBIN (HGB A1C): HbA1c, POC (controlled diabetic range): 6.4 % (ref 0.0–7.0)

## 2024-09-12 NOTE — Assessment & Plan Note (Signed)
 Excellent job controlling A1c, continue current plan. Referral in place for optho.

## 2024-09-12 NOTE — Patient Instructions (Addendum)
 It was good to see you today.   Please bring ALL of your medications with you to every visit.    Today we talked about: Diabetes- excellent job with your diabetes, your A1c was 6.4 Post coital bleeding- this can be normal. However, if you have pain or worsening bleeding, we should see you in clinic for a full exam to evaluate further.  When should you call for help?   Call your doctor or nurse advice line now or seek immediate medical care if: You have severe vaginal bleeding. You have new or worse belly or pelvic pain. Watch closely for changes in your health, and be sure to contact your doctor or nurse advice line if: You do not get better as expected. You have unusual vaginal bleeding.    Thank you for choosing [facility]. Please refer to your mychart for specifics regarding today's visit or future appointments.

## 2024-09-12 NOTE — Progress Notes (Signed)
° ° °  SUBJECTIVE:   CHIEF COMPLAINT / HPI:   DM2 Lab Results  Component Value Date   HGBA1C 6.4 09/12/2024  -On metformin , wegovy  not covered  Post Cotial Bleeding - noted some spotting after intercourse, no pain - No discharge, has an IUD  - No periods with IUD   PERTINENT  PMH / PSH: IUD, DM2  OBJECTIVE:   BP (!) 125/97   Pulse 85   Ht 5' 3 (1.6 m)   Wt 181 lb 12.8 oz (82.5 kg)   SpO2 100%   BMI 32.20 kg/m   Physical Exam General: Alert, conversant, cooperative. No acute distress.  HEENT: PERRL. EOMI. MMM.  Cardiovascular: RRR Respiratory: Lungs CTAB. Normal work of breathing. Abdomen: Non distended, non tender Extremities: No cyanosis. No edema Musculoskeletal: No gross deformities.  Skin: Warm. Dry. No rashes. No icterus.  Neurologic: No focal deficits. Moving all extremities. Psychiatric: Cooperative. Appropriate mood. Appropriate affect.   ASSESSMENT/PLAN:   Assessment & Plan Type 2 diabetes mellitus without complication, without long-term current use of insulin  (HCC) Excellent job controlling A1c, continue current plan. Referral in place for optho.  PCB (post coital bleeding) Pt declined pelvic as her young child was with her. Discussed common pcb and bleeding with iud. Symptoms currently do not sound alarming but if worsening bleeding, pain, discharge, will need FU for exam.      Milda LITTIE Deed, MD [facility]

## 2024-09-13 DIAGNOSIS — Z419 Encounter for procedure for purposes other than remedying health state, unspecified: Secondary | ICD-10-CM | POA: Diagnosis not present

## 2024-11-01 ENCOUNTER — Ambulatory Visit: Payer: Self-pay
